# Patient Record
Sex: Female | Born: 1975 | Race: White | Hispanic: No | Smoking: Current every day smoker
Health system: Southern US, Community
[De-identification: ages and names within clinical notes are randomized; demographics above are authoritative.]

## PROBLEM LIST (undated history)

## (undated) ENCOUNTER — Emergency Department (HOSPITAL_COMMUNITY): Admission: EM | Payer: Medicaid Other | Source: Home / Self Care

## (undated) DIAGNOSIS — M1990 Unspecified osteoarthritis, unspecified site: Secondary | ICD-10-CM

## (undated) DIAGNOSIS — R51 Headache: Secondary | ICD-10-CM

## (undated) DIAGNOSIS — F3181 Bipolar II disorder: Secondary | ICD-10-CM

## (undated) DIAGNOSIS — R519 Headache, unspecified: Secondary | ICD-10-CM

## (undated) DIAGNOSIS — G5602 Carpal tunnel syndrome, left upper limb: Secondary | ICD-10-CM

## (undated) DIAGNOSIS — F419 Anxiety disorder, unspecified: Secondary | ICD-10-CM

## (undated) DIAGNOSIS — J449 Chronic obstructive pulmonary disease, unspecified: Secondary | ICD-10-CM

## (undated) DIAGNOSIS — F329 Major depressive disorder, single episode, unspecified: Secondary | ICD-10-CM

## (undated) DIAGNOSIS — F32A Depression, unspecified: Secondary | ICD-10-CM

## (undated) HISTORY — PX: WISDOM TOOTH EXTRACTION: SHX21

## (undated) HISTORY — PX: TOOTH EXTRACTION: SHX859

---

## 2000-09-15 ENCOUNTER — Emergency Department (HOSPITAL_COMMUNITY): Admission: EM | Admit: 2000-09-15 | Discharge: 2000-09-15 | Payer: Self-pay | Admitting: *Deleted

## 2000-09-15 ENCOUNTER — Encounter: Payer: Self-pay | Admitting: *Deleted

## 2001-07-23 ENCOUNTER — Emergency Department (HOSPITAL_COMMUNITY): Admission: EM | Admit: 2001-07-23 | Discharge: 2001-07-23 | Payer: Self-pay | Admitting: Internal Medicine

## 2002-05-23 ENCOUNTER — Emergency Department (HOSPITAL_COMMUNITY): Admission: EM | Admit: 2002-05-23 | Discharge: 2002-05-24 | Payer: Self-pay | Admitting: Emergency Medicine

## 2002-05-25 ENCOUNTER — Encounter: Payer: Self-pay | Admitting: Emergency Medicine

## 2002-05-25 ENCOUNTER — Emergency Department (HOSPITAL_COMMUNITY): Admission: EM | Admit: 2002-05-25 | Discharge: 2002-05-25 | Payer: Self-pay | Admitting: Emergency Medicine

## 2005-06-08 ENCOUNTER — Ambulatory Visit: Payer: Self-pay | Admitting: Family Medicine

## 2005-08-03 ENCOUNTER — Ambulatory Visit: Payer: Self-pay | Admitting: Family Medicine

## 2006-06-29 ENCOUNTER — Emergency Department (HOSPITAL_COMMUNITY): Admission: EM | Admit: 2006-06-29 | Discharge: 2006-06-30 | Payer: Self-pay | Admitting: Emergency Medicine

## 2008-03-08 ENCOUNTER — Encounter: Payer: Self-pay | Admitting: Family Medicine

## 2009-02-11 ENCOUNTER — Encounter: Payer: Self-pay | Admitting: Family Medicine

## 2009-12-16 ENCOUNTER — Ambulatory Visit (HOSPITAL_COMMUNITY): Admission: RE | Admit: 2009-12-16 | Discharge: 2009-12-16 | Payer: Self-pay | Admitting: Family Medicine

## 2009-12-16 IMAGING — CR DG SCAPULA*L*
3 series · 3 of 3 positions shown · non-contrast
Comparison: None.

CLINICAL DATA: Shoulder pain

LEFT SCAPULA - 2+ VIEWS

[view not recorded (1 of 3)]
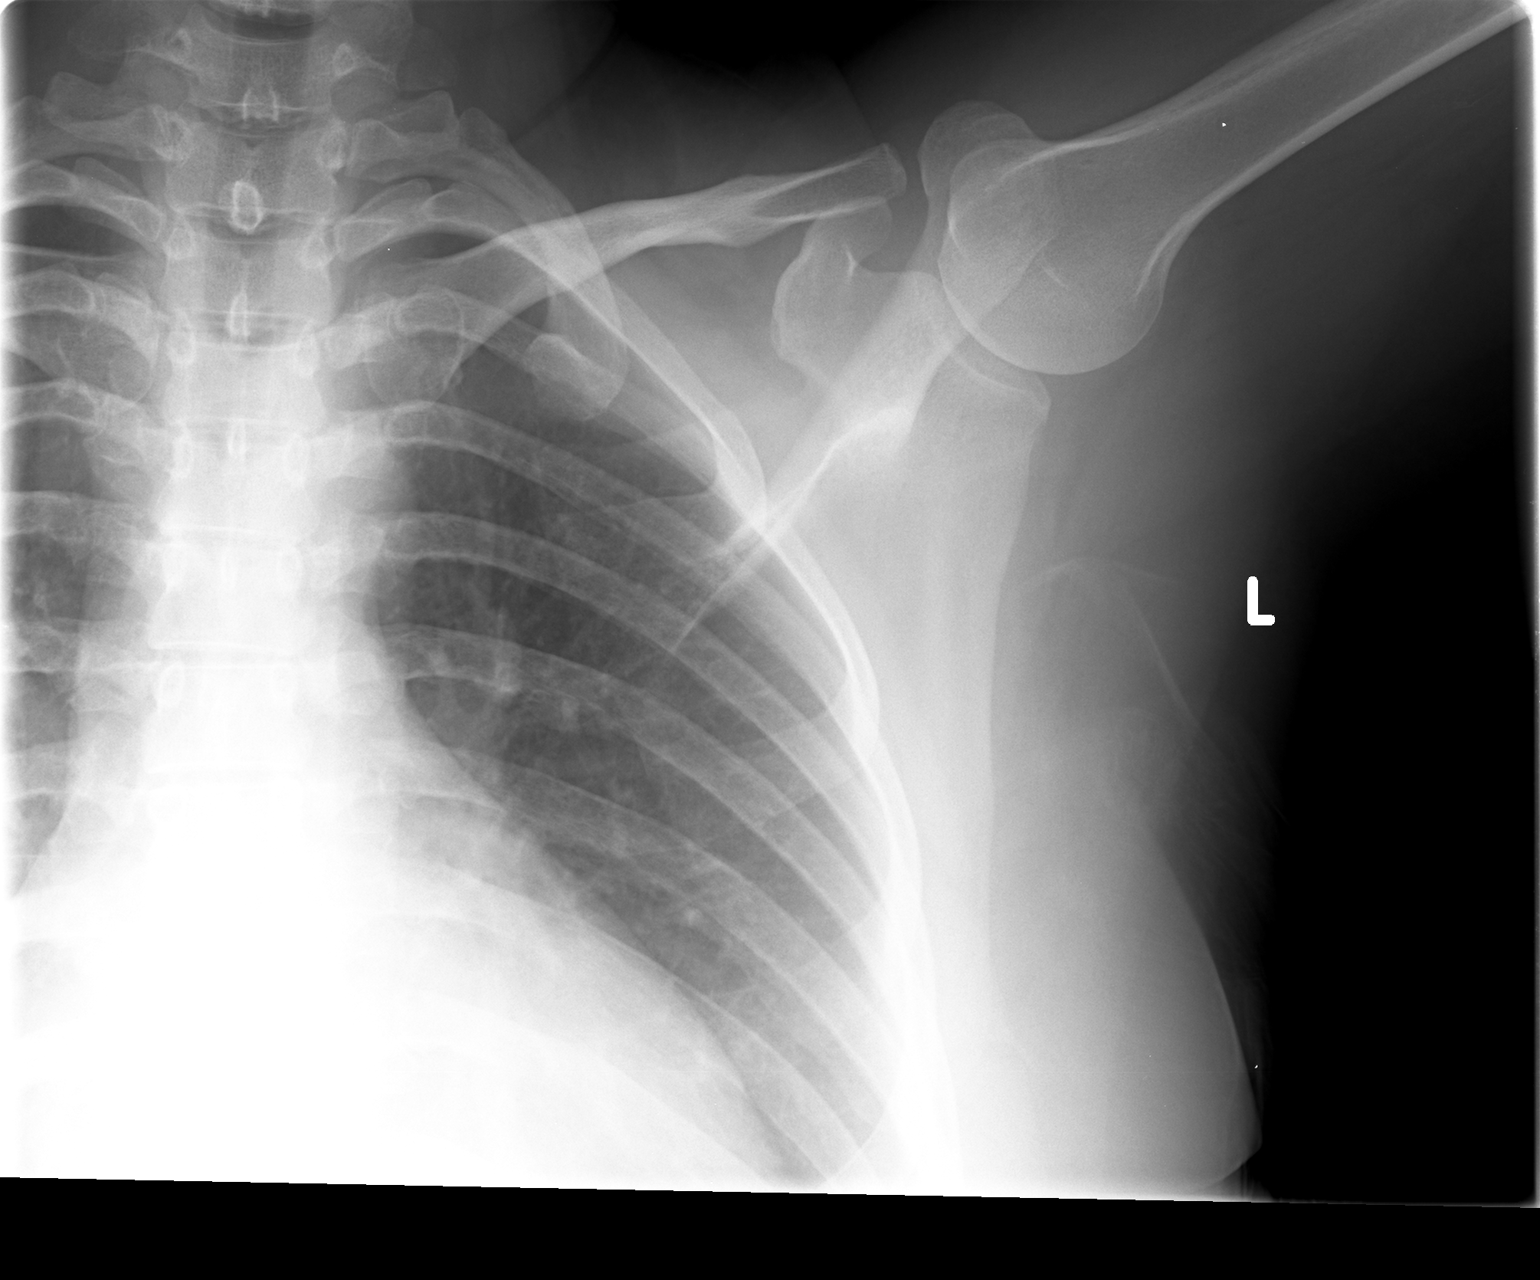

[view not recorded (2 of 3)]
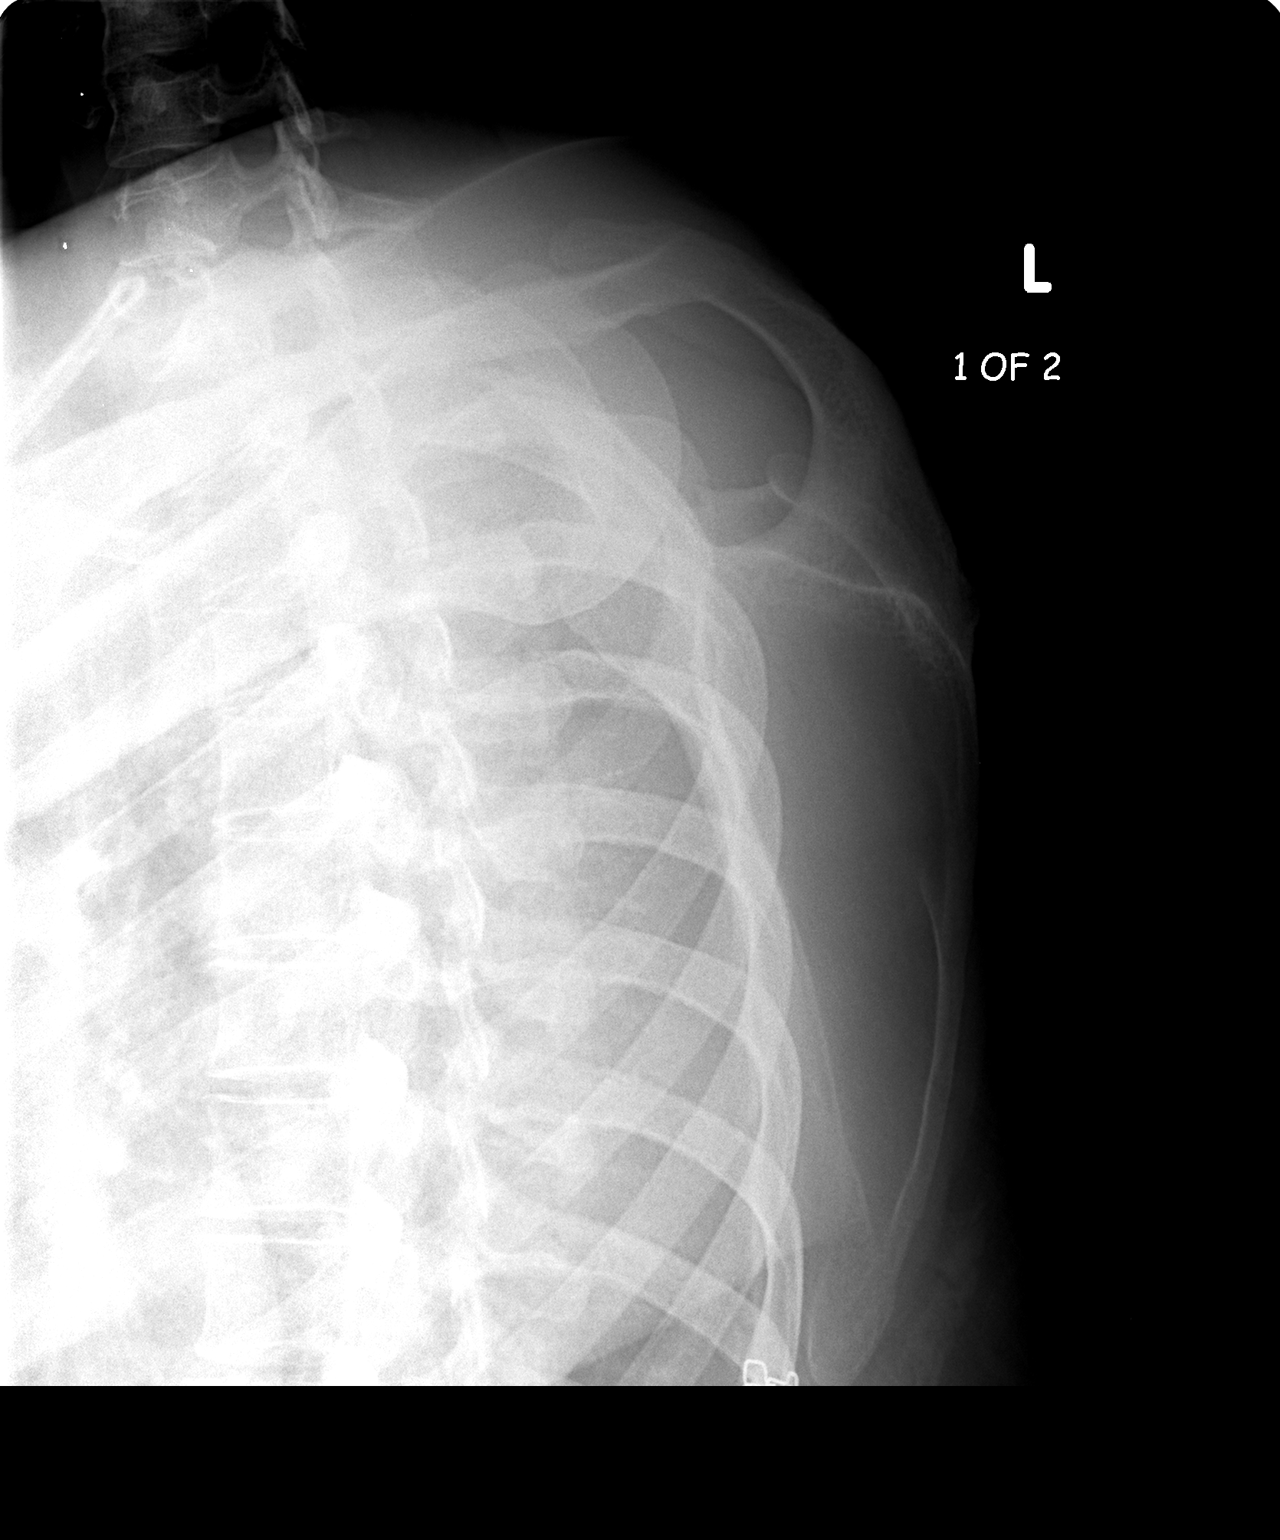

[view not recorded (3 of 3)]
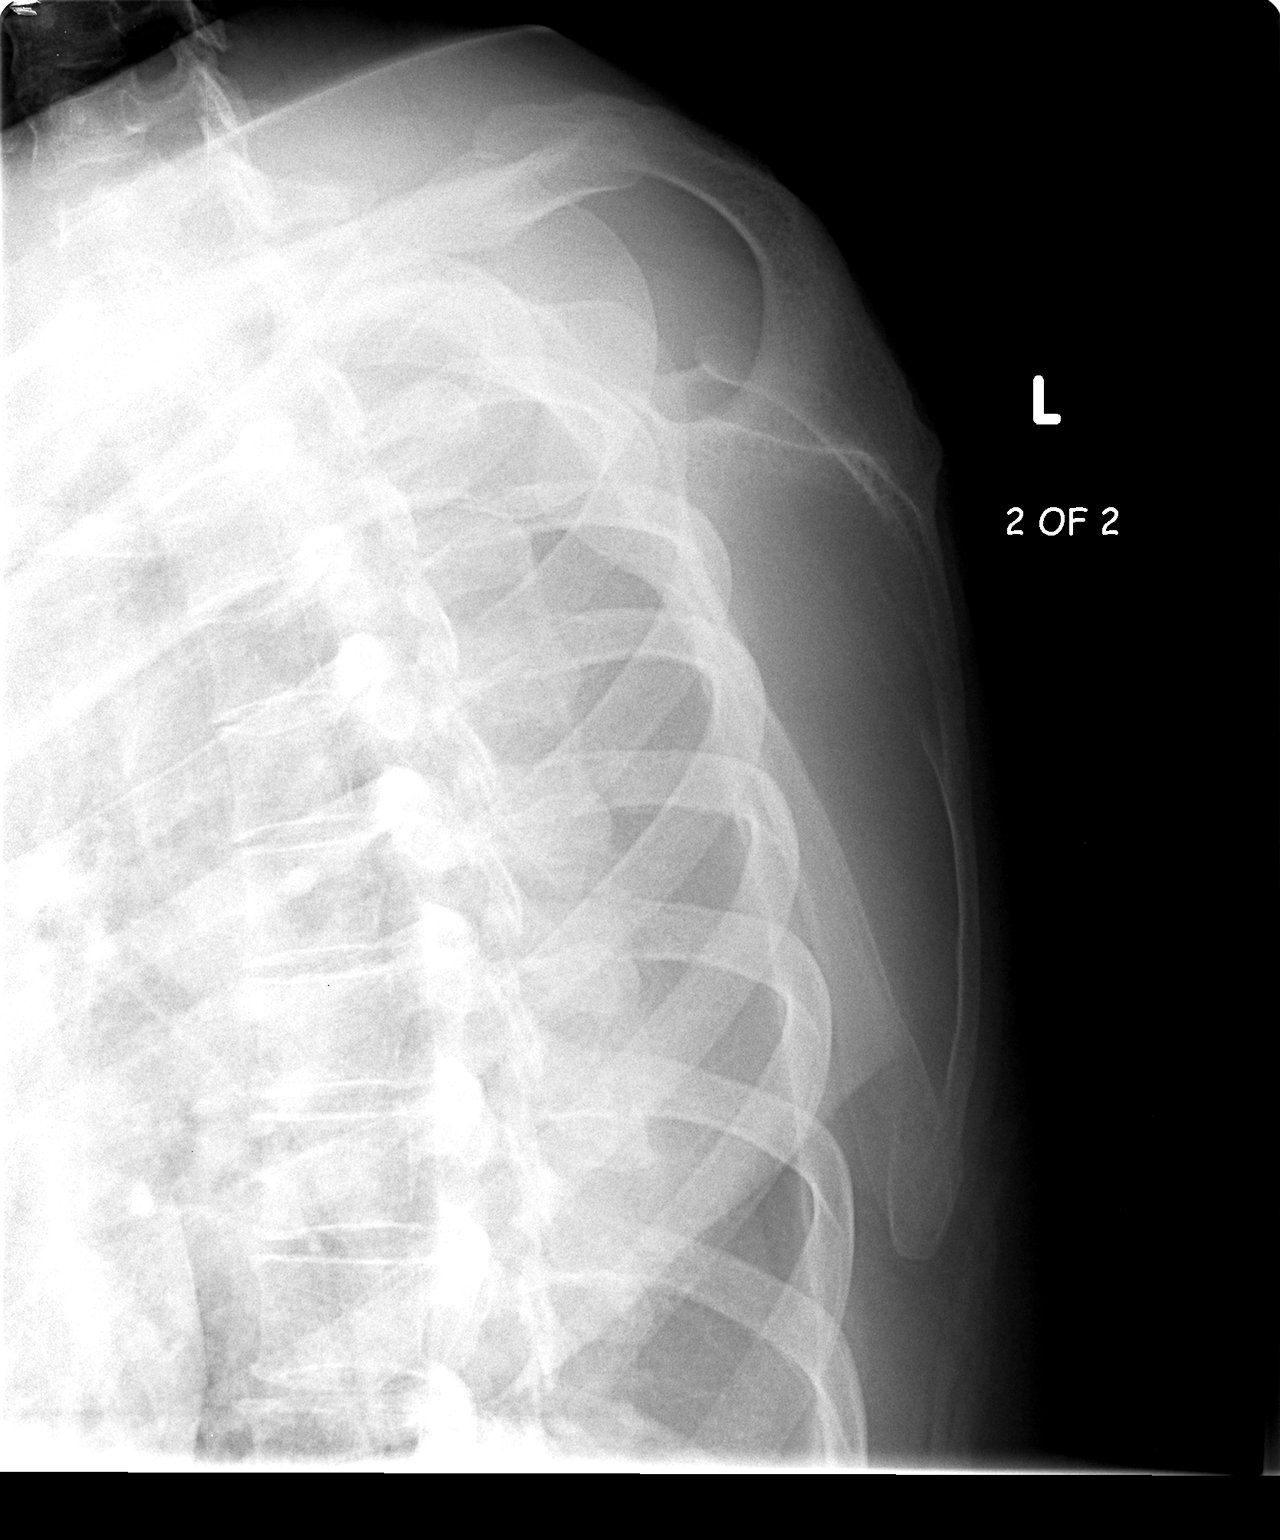

[3 of 3 positions shown; findings below may reference images not displayed]

FINDINGS: Left scapula appears intact.  Left AC joint and
glenohumeral joint aligned.  No fracture demonstrated.
IMPRESSION: No acute osseous finding.

## 2010-03-10 NOTE — Letter (Signed)
Summary: Historic Patient File  Historic Patient File   Imported By: Lind Guest 02/11/2009 08:10:22  _____________________________________________________________________  External Attachment:    Type:   Image     Comment:   External Document

## 2012-07-02 ENCOUNTER — Encounter (HOSPITAL_COMMUNITY): Payer: Self-pay | Admitting: Emergency Medicine

## 2012-07-02 ENCOUNTER — Emergency Department (HOSPITAL_COMMUNITY)
Admission: EM | Admit: 2012-07-02 | Discharge: 2012-07-03 | Disposition: A | Discharge status: Home / Self Care | Payer: No Typology Code available for payment source | Attending: Emergency Medicine | Admitting: Emergency Medicine

## 2012-07-02 DIAGNOSIS — IMO0002 Reserved for concepts with insufficient information to code with codable children: Secondary | ICD-10-CM | POA: Insufficient documentation

## 2012-07-02 DIAGNOSIS — S161XXA Strain of muscle, fascia and tendon at neck level, initial encounter: Secondary | ICD-10-CM

## 2012-07-02 DIAGNOSIS — S99919A Unspecified injury of unspecified ankle, initial encounter: Secondary | ICD-10-CM | POA: Insufficient documentation

## 2012-07-02 DIAGNOSIS — M25461 Effusion, right knee: Secondary | ICD-10-CM

## 2012-07-02 DIAGNOSIS — F17200 Nicotine dependence, unspecified, uncomplicated: Secondary | ICD-10-CM | POA: Insufficient documentation

## 2012-07-02 DIAGNOSIS — Y9389 Activity, other specified: Secondary | ICD-10-CM | POA: Insufficient documentation

## 2012-07-02 DIAGNOSIS — S8990XA Unspecified injury of unspecified lower leg, initial encounter: Secondary | ICD-10-CM | POA: Insufficient documentation

## 2012-07-02 DIAGNOSIS — Y92410 Unspecified street and highway as the place of occurrence of the external cause: Secondary | ICD-10-CM | POA: Insufficient documentation

## 2012-07-02 DIAGNOSIS — S139XXA Sprain of joints and ligaments of unspecified parts of neck, initial encounter: Secondary | ICD-10-CM | POA: Insufficient documentation
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
## 2012-07-03 ENCOUNTER — Emergency Department (HOSPITAL_COMMUNITY): Payer: No Typology Code available for payment source
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
## 2012-07-03 MED ORDER — IBUPROFEN 600 MG PO TABS: 600.0000 mg | ORAL_TABLET | Freq: Four times a day (QID) | ORAL | Status: DC | PRN

## 2012-07-03 MED ORDER — HYDROCODONE-ACETAMINOPHEN 5-325 MG PO TABS: 1.0000 | ORAL_TABLET | ORAL | Status: DC | PRN

## 2012-07-03 NOTE — ED Provider Notes (Signed)
Medical screening examination/treatment/procedure(s) were performed by non-physician practitioner and as supervising physician I was immediately available for consultation/collaboration.   Joya Gaskins, MD 07/03/12 [phone]
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
## 2012-07-11 ENCOUNTER — Encounter: Payer: Self-pay | Admitting: Orthopedic Surgery

## 2012-07-11 ENCOUNTER — Ambulatory Visit (INDEPENDENT_AMBULATORY_CARE_PROVIDER_SITE_OTHER): Payer: Medicaid Other | Admitting: Orthopedic Surgery

## 2012-07-11 VITALS — BP 110/70 | Ht 64.0 in | Wt 218.0 lb

## 2012-07-11 DIAGNOSIS — S8001XA Contusion of right knee, initial encounter: Secondary | ICD-10-CM

## 2012-07-11 DIAGNOSIS — M549 Dorsalgia, unspecified: Secondary | ICD-10-CM

## 2012-07-11 DIAGNOSIS — S8000XA Contusion of unspecified knee, initial encounter: Secondary | ICD-10-CM | POA: Insufficient documentation

## 2012-07-11 MED ORDER — HYDROCODONE-ACETAMINOPHEN 5-325 MG PO TABS: 1.0000 | ORAL_TABLET | ORAL | Status: DC | PRN

## 2012-07-11 MED ORDER — IBUPROFEN 800 MG PO TABS: 800.0000 mg | ORAL_TABLET | Freq: Three times a day (TID) | ORAL | Status: DC | PRN
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
## 2012-07-11 NOTE — Patient Instructions (Addendum)
Wear hinged knee brace  Try to bend the knee 25 times 3 x a day   Take medications as ordered pick up from pharmacy and get paper script filled

## 2012-07-16 ENCOUNTER — Emergency Department (HOSPITAL_COMMUNITY)
Admission: EM | Admit: 2012-07-16 | Discharge: 2012-07-16 | Disposition: A | Discharge status: Home / Self Care | Payer: No Typology Code available for payment source | Attending: Emergency Medicine | Admitting: Emergency Medicine

## 2012-07-16 ENCOUNTER — Encounter (HOSPITAL_COMMUNITY): Payer: Self-pay | Admitting: *Deleted

## 2012-07-16 DIAGNOSIS — E669 Obesity, unspecified: Secondary | ICD-10-CM | POA: Insufficient documentation

## 2012-07-16 DIAGNOSIS — Z87828 Personal history of other (healed) physical injury and trauma: Secondary | ICD-10-CM | POA: Insufficient documentation

## 2012-07-16 DIAGNOSIS — R5383 Other fatigue: Secondary | ICD-10-CM | POA: Insufficient documentation

## 2012-07-16 DIAGNOSIS — IMO0002 Reserved for concepts with insufficient information to code with codable children: Secondary | ICD-10-CM | POA: Insufficient documentation

## 2012-07-16 DIAGNOSIS — R209 Unspecified disturbances of skin sensation: Secondary | ICD-10-CM | POA: Insufficient documentation

## 2012-07-16 DIAGNOSIS — M542 Cervicalgia: Secondary | ICD-10-CM | POA: Insufficient documentation

## 2012-07-16 DIAGNOSIS — M549 Dorsalgia, unspecified: Secondary | ICD-10-CM | POA: Insufficient documentation

## 2012-07-16 DIAGNOSIS — F17200 Nicotine dependence, unspecified, uncomplicated: Secondary | ICD-10-CM | POA: Insufficient documentation

## 2012-07-16 DIAGNOSIS — R5381 Other malaise: Secondary | ICD-10-CM | POA: Insufficient documentation
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
## 2012-07-16 NOTE — ED Notes (Signed)
Left after talking with PA, DID NOT WAIT FOR DISCHARGE INSTRUCTIONS

## 2012-07-16 NOTE — ED Notes (Signed)
When I asked pt is she had any medication for pain at home, pt states that she did not have any pain medication at home, that she had only received one prescription while she was here and that was for 15 tablets, I verified pt's medications with pt, reviewed Dr. Mort Sawyers note that stated that on 07/11/2012 pt was given vicodin 42 tablets with 3 refills, when asked about this prescription pt states that she had forgotten about it and that she had received a text from the pharmacy this am stating that it was ready.
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
## 2012-07-20 NOTE — ED Provider Notes (Signed)
Medical screening examination/treatment/procedure(s) were performed by non-physician practitioner and as supervising physician I was immediately available for consultation/collaboration.   Shelda Jakes, MD 07/20/12 [phone]

## 2012-08-08 ENCOUNTER — Telehealth: Payer: Self-pay | Admitting: Orthopedic Surgery

## 2012-08-08 ENCOUNTER — Ambulatory Visit (INDEPENDENT_AMBULATORY_CARE_PROVIDER_SITE_OTHER): Payer: Medicaid Other | Admitting: Orthopedic Surgery

## 2012-08-08 ENCOUNTER — Encounter: Payer: Self-pay | Admitting: Orthopedic Surgery

## 2012-08-08 VITALS — BP 130/82 | Ht 64.0 in | Wt 218.0 lb

## 2012-08-08 DIAGNOSIS — IMO0002 Reserved for concepts with insufficient information to code with codable children: Secondary | ICD-10-CM

## 2012-08-08 DIAGNOSIS — M792 Neuralgia and neuritis, unspecified: Secondary | ICD-10-CM | POA: Insufficient documentation
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
## 2012-08-08 NOTE — Patient Instructions (Addendum)
MRI CERVICAL SPINE   You have received a steroid shot. 15% of patients experience increased pain at the injection site with in the next 24 hours. This is best treated with ice and tylenol extra strength 2 tabs every 8 hours. If you are still having pain please call the office.

## 2012-08-14 ENCOUNTER — Telehealth: Payer: Self-pay | Admitting: Orthopedic Surgery

## 2012-08-14 NOTE — Telephone Encounter (Signed)
Patient called, states was advised by our office to call when she was out of medication and needed refill for: Hydrocodone-Norco/Vicodine 5-325mg .  Her ph# is [phone].

## 2012-08-14 NOTE — Telephone Encounter (Signed)
Called patient and ask her to have pharmacy fax refill request.

## 2012-08-22 ENCOUNTER — Ambulatory Visit: Payer: Medicaid Other | Admitting: Orthopedic Surgery

## 2012-10-19 ENCOUNTER — Encounter: Payer: Self-pay | Admitting: Orthopedic Surgery

## 2012-10-19 NOTE — Telephone Encounter (Signed)
10/19/12 Patient stopped in to office, mainly for copy of work note from previous visit (07/11/12) - done as per request. She mentioned, in regard to MRI which is on hold, due to insurance and new primary care physician, that her new primary care doctor states she needs to first try using exercise ball for her back.  No authorization for MRI; hold.

## 2013-06-04 ENCOUNTER — Encounter (HOSPITAL_COMMUNITY): Payer: Self-pay | Admitting: Emergency Medicine

## 2013-06-04 ENCOUNTER — Emergency Department (HOSPITAL_COMMUNITY): Payer: No Typology Code available for payment source

## 2013-06-04 ENCOUNTER — Emergency Department (HOSPITAL_COMMUNITY)
Admission: EM | Admit: 2013-06-04 | Discharge: 2013-06-04 | Disposition: A | Discharge status: Home / Self Care | Payer: No Typology Code available for payment source | Attending: Emergency Medicine | Admitting: Emergency Medicine

## 2013-06-04 DIAGNOSIS — Y9389 Activity, other specified: Secondary | ICD-10-CM | POA: Insufficient documentation

## 2013-06-04 DIAGNOSIS — Y92410 Unspecified street and highway as the place of occurrence of the external cause: Secondary | ICD-10-CM | POA: Insufficient documentation

## 2013-06-04 DIAGNOSIS — F411 Generalized anxiety disorder: Secondary | ICD-10-CM | POA: Insufficient documentation

## 2013-06-04 DIAGNOSIS — Z3202 Encounter for pregnancy test, result negative: Secondary | ICD-10-CM | POA: Insufficient documentation

## 2013-06-04 DIAGNOSIS — F329 Major depressive disorder, single episode, unspecified: Secondary | ICD-10-CM | POA: Insufficient documentation

## 2013-06-04 DIAGNOSIS — Z79899 Other long term (current) drug therapy: Secondary | ICD-10-CM | POA: Insufficient documentation

## 2013-06-04 DIAGNOSIS — S335XXA Sprain of ligaments of lumbar spine, initial encounter: Secondary | ICD-10-CM | POA: Insufficient documentation

## 2013-06-04 DIAGNOSIS — S139XXA Sprain of joints and ligaments of unspecified parts of neck, initial encounter: Secondary | ICD-10-CM | POA: Insufficient documentation

## 2013-06-04 DIAGNOSIS — S161XXA Strain of muscle, fascia and tendon at neck level, initial encounter: Secondary | ICD-10-CM

## 2013-06-04 DIAGNOSIS — F17200 Nicotine dependence, unspecified, uncomplicated: Secondary | ICD-10-CM | POA: Insufficient documentation

## 2013-06-04 DIAGNOSIS — S39012A Strain of muscle, fascia and tendon of lower back, initial encounter: Secondary | ICD-10-CM

## 2013-06-04 DIAGNOSIS — F3289 Other specified depressive episodes: Secondary | ICD-10-CM | POA: Insufficient documentation

## 2013-06-04 HISTORY — DX: Major depressive disorder, single episode, unspecified: F32.9

## 2013-06-04 HISTORY — DX: Depression, unspecified: F32.A

## 2013-06-04 HISTORY — DX: Anxiety disorder, unspecified: F41.9

## 2013-06-04 LAB — POC URINE PREG, ED: Preg Test, Ur: NEGATIVE
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
## 2013-06-04 MED ORDER — ACETAMINOPHEN 325 MG PO TABS
650.0000 mg | ORAL_TABLET | Freq: Once | ORAL | Status: AC
  Administered 2013-06-04: 650 mg via ORAL
  Filled 2013-06-04: qty 2

## 2013-06-04 MED ORDER — CYCLOBENZAPRINE HCL 5 MG PO TABS: 5.0000 mg | ORAL_TABLET | Freq: Three times a day (TID) | ORAL | Status: DC | PRN

## 2013-06-04 MED ORDER — IBUPROFEN 600 MG PO TABS: 600.0000 mg | ORAL_TABLET | Freq: Four times a day (QID) | ORAL | Status: DC | PRN

## 2013-06-04 NOTE — ED Notes (Signed)
Walked to BR, refused to use bedpan again
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
## 2013-06-04 NOTE — ED Provider Notes (Signed)
Medical screening examination/treatment/procedure(s) were performed by non-physician practitioner and as supervising physician I was immediately available for consultation/collaboration.   EKG Interpretation None        Kyara Boxer L Perrie Ragin, MD 06/04/13 2258 
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
## 2014-05-24 ENCOUNTER — Emergency Department (HOSPITAL_COMMUNITY): Payer: Medicaid Other

## 2014-05-24 ENCOUNTER — Emergency Department (HOSPITAL_COMMUNITY)
Admission: EM | Admit: 2014-05-24 | Discharge: 2014-05-24 | Disposition: A | Discharge status: Home / Self Care | Payer: Medicaid Other | Attending: Emergency Medicine | Admitting: Emergency Medicine

## 2014-05-24 ENCOUNTER — Encounter (HOSPITAL_COMMUNITY): Payer: Self-pay | Admitting: *Deleted

## 2014-05-24 DIAGNOSIS — Y998 Other external cause status: Secondary | ICD-10-CM | POA: Diagnosis not present

## 2014-05-24 DIAGNOSIS — Y9389 Activity, other specified: Secondary | ICD-10-CM | POA: Diagnosis not present

## 2014-05-24 DIAGNOSIS — S299XXA Unspecified injury of thorax, initial encounter: Secondary | ICD-10-CM | POA: Diagnosis not present

## 2014-05-24 DIAGNOSIS — R52 Pain, unspecified: Secondary | ICD-10-CM | POA: Diagnosis present

## 2014-05-24 DIAGNOSIS — Z79899 Other long term (current) drug therapy: Secondary | ICD-10-CM | POA: Insufficient documentation

## 2014-05-24 DIAGNOSIS — S3991XA Unspecified injury of abdomen, initial encounter: Secondary | ICD-10-CM | POA: Diagnosis not present

## 2014-05-24 DIAGNOSIS — Y92410 Unspecified street and highway as the place of occurrence of the external cause: Secondary | ICD-10-CM | POA: Insufficient documentation

## 2014-05-24 DIAGNOSIS — S161XXA Strain of muscle, fascia and tendon at neck level, initial encounter: Secondary | ICD-10-CM | POA: Diagnosis not present

## 2014-05-24 DIAGNOSIS — F419 Anxiety disorder, unspecified: Secondary | ICD-10-CM | POA: Diagnosis not present

## 2014-05-24 DIAGNOSIS — F329 Major depressive disorder, single episode, unspecified: Secondary | ICD-10-CM | POA: Diagnosis not present

## 2014-05-24 DIAGNOSIS — Z720 Tobacco use: Secondary | ICD-10-CM | POA: Insufficient documentation
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
## 2014-05-24 MED ORDER — CYCLOBENZAPRINE HCL 10 MG PO TABS: 10.0000 mg | ORAL_TABLET | Freq: Three times a day (TID) | ORAL | Status: DC

## 2014-05-24 MED ORDER — HYDROCODONE-ACETAMINOPHEN 5-325 MG PO TABS: 1.0000 | ORAL_TABLET | Freq: Four times a day (QID) | ORAL | Status: DC | PRN
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
## 2014-05-24 NOTE — Discharge Instructions (Signed)
Follow up with your md next week if needed °
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
## 2015-02-17 ENCOUNTER — Emergency Department (HOSPITAL_COMMUNITY)
Admission: EM | Admit: 2015-02-17 | Discharge: 2015-02-17 | Disposition: A | Discharge status: Home / Self Care | Payer: Medicaid Other
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
## 2015-03-02 ENCOUNTER — Emergency Department (HOSPITAL_COMMUNITY)
Admission: EM | Admit: 2015-03-02 | Discharge: 2015-03-02 | Disposition: A | Discharge status: Home / Self Care | Payer: Medicaid Other | Attending: Emergency Medicine | Admitting: Emergency Medicine

## 2015-03-02 ENCOUNTER — Emergency Department (HOSPITAL_COMMUNITY): Payer: Medicaid Other

## 2015-03-02 ENCOUNTER — Encounter (HOSPITAL_COMMUNITY): Payer: Self-pay | Admitting: Emergency Medicine

## 2015-03-02 DIAGNOSIS — Y9389 Activity, other specified: Secondary | ICD-10-CM | POA: Diagnosis not present

## 2015-03-02 DIAGNOSIS — W228XXA Striking against or struck by other objects, initial encounter: Secondary | ICD-10-CM | POA: Diagnosis not present

## 2015-03-02 DIAGNOSIS — Z79899 Other long term (current) drug therapy: Secondary | ICD-10-CM | POA: Diagnosis not present

## 2015-03-02 DIAGNOSIS — Y998 Other external cause status: Secondary | ICD-10-CM | POA: Diagnosis not present

## 2015-03-02 DIAGNOSIS — S62626A Displaced fracture of medial phalanx of right little finger, initial encounter for closed fracture: Secondary | ICD-10-CM | POA: Insufficient documentation

## 2015-03-02 DIAGNOSIS — F17210 Nicotine dependence, cigarettes, uncomplicated: Secondary | ICD-10-CM | POA: Diagnosis not present

## 2015-03-02 DIAGNOSIS — Y9289 Other specified places as the place of occurrence of the external cause: Secondary | ICD-10-CM | POA: Insufficient documentation

## 2015-03-02 DIAGNOSIS — F419 Anxiety disorder, unspecified: Secondary | ICD-10-CM | POA: Insufficient documentation

## 2015-03-02 DIAGNOSIS — F329 Major depressive disorder, single episode, unspecified: Secondary | ICD-10-CM | POA: Diagnosis not present

## 2015-03-02 DIAGNOSIS — S6991XA Unspecified injury of right wrist, hand and finger(s), initial encounter: Secondary | ICD-10-CM | POA: Diagnosis present

## 2015-03-02 DIAGNOSIS — S62609A Fracture of unspecified phalanx of unspecified finger, initial encounter for closed fracture: Secondary | ICD-10-CM
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
## 2015-03-02 MED ORDER — OXYCODONE-ACETAMINOPHEN 5-325 MG PO TABS
1.0000 | ORAL_TABLET | Freq: Once | ORAL | Status: AC
  Administered 2015-03-02: 1 via ORAL
  Filled 2015-03-02: qty 1

## 2015-03-02 MED ORDER — OXYCODONE-ACETAMINOPHEN 5-325 MG PO TABS: 1.0000 | ORAL_TABLET | ORAL | Status: DC | PRN

## 2015-03-02 NOTE — Discharge Instructions (Signed)
Finger Fracture  Fractures of fingers are breaks in the bones of the fingers. There are many types of fractures. There are different ways of treating these fractures. Your health care provider will discuss the best way to treat your fracture.  CAUSES  Traumatic injury is the main cause of broken fingers. These include:  · Injuries while playing sports.  · Workplace injuries.  · Falls.  RISK FACTORS  Activities that can increase your risk of finger fractures include:  · Sports.  · Workplace activities that involve machinery.  · A condition called osteoporosis, which can make your bones less dense and cause them to fracture more easily.  SIGNS AND SYMPTOMS  The main symptoms of a broken finger are pain and swelling within 15 minutes after the injury. Other symptoms include:  · Bruising of your finger.  · Stiffness of your finger.  · Numbness of your finger.  · Exposed bones (compound fracture) if the fracture is severe.  DIAGNOSIS   The best way to diagnose a broken bone is with X-ray imaging. Additionally, your health care provider will use this X-ray image to evaluate the position of the broken finger bones.   TREATMENT   Finger fractures can be treated with:   · Nonreduction--This means the bones are in place. The finger is splinted without changing the positions of the bone pieces. The splint is usually left on for about a week to 10 days. This will depend on your fracture and what your health care provider thinks.  · Closed reduction--The bones are put back into position without using surgery. The finger is then splinted.  · Open reduction and internal fixation--The fracture site is opened. Then the bone pieces are fixed into place with pins or some type of hardware. This is seldom required. It depends on the severity of the fracture.  HOME CARE INSTRUCTIONS   · Follow your health care provider's instructions regarding activities, exercises, and physical therapy.  · Only take over-the-counter or prescription  medicines for pain, discomfort, or fever as directed by your health care provider.  SEEK MEDICAL CARE IF:  You have pain or swelling that limits the motion or use of your fingers.  SEEK IMMEDIATE MEDICAL CARE IF:   Your finger becomes numb.  MAKE SURE YOU:   · Understand these instructions.  · Will watch your condition.  · Will get help right away if you are not doing well or get worse.     This information is not intended to replace advice given to you by your health care provider. Make sure you discuss any questions you have with your health care provider.     Document Released: 05/09/2000 Document Revised: 11/15/2012 Document Reviewed: 09/06/2012  Elsevier Interactive Patient Education ©2016 [employer]
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
## 2015-03-04 ENCOUNTER — Telehealth: Payer: Self-pay | Admitting: *Deleted

## 2015-03-04 NOTE — Telephone Encounter (Signed)
Patient called to schedule a appointment from a ER visit she has 03/02/15, patient has a fx finger per xray 03/02/15. Patient states she has medicaid and I made her aware that we will need a referral from her primary care physician  before we can schedule her to be seen by Dr. Romeo Apple. Patient states she will call Dr. Sudie Bailey. I made patient aware once we receive the referral we can then schedule her to be seen.

## 2015-03-10 NOTE — Telephone Encounter (Signed)
Received referral from primary care, Dr Sudie Bailey; appointment is scheduled for first available after move into our new location; patient aware of appointment.

## 2015-03-12 ENCOUNTER — Telehealth: Payer: Self-pay

## 2015-03-12 NOTE — Telephone Encounter (Signed)
Patient called to discuss xray results.  Please advise.

## 2015-03-12 NOTE — Telephone Encounter (Signed)
03/11/15 patient has been scheduled accordingly, per Dr Mort Sawyers review, and is aware of appointment.

## 2015-03-12 NOTE — Telephone Encounter (Signed)
Please advise that results will be reviewed at time of office visit, thanks

## 2015-03-12 NOTE — Telephone Encounter (Signed)
Called back to patient; see previous phone note in which Dr Romeo Apple reviewed and approved appointment date of (next clinic day) Friday 03/14/15, for which I had already scheduled - left message for patient to call back to confirm.

## 2015-03-14 ENCOUNTER — Encounter: Payer: Self-pay | Admitting: Orthopedic Surgery

## 2015-03-14 ENCOUNTER — Ambulatory Visit (INDEPENDENT_AMBULATORY_CARE_PROVIDER_SITE_OTHER): Payer: Medicaid Other

## 2015-03-14 ENCOUNTER — Ambulatory Visit (INDEPENDENT_AMBULATORY_CARE_PROVIDER_SITE_OTHER): Payer: Medicaid Other | Admitting: Orthopedic Surgery

## 2015-03-14 VITALS — BP 137/90 | HR 78 | Resp 16 | Ht 64.0 in | Wt 167.0 lb

## 2015-03-14 DIAGNOSIS — S62629A Displaced fracture of medial phalanx of unspecified finger, initial encounter for closed fracture: Secondary | ICD-10-CM | POA: Diagnosis not present

## 2015-03-14 DIAGNOSIS — S6991XA Unspecified injury of right wrist, hand and finger(s), initial encounter: Secondary | ICD-10-CM

## 2015-03-14 NOTE — Progress Notes (Signed)
Patient ID: Ryonna Cimini, female   DOB: 1975/05/19, 40 y.o.   MRN: [id number]  Chief Complaint  Patient presents with  . Hand Pain    Rt 5th digit middle phalynx fx. My finger still hurts and is red and purple and bruised into my hand.    HPI Ilham Roughton is a 39 y.o. female.  Presents with a 12 day old fracture right small finger injured at home cleaning up  Pain mild right small finger Swelling Slight aching Constant  Review of Systems Review of Systems  Denies numbness or tingling under neuro Swelling under musculoskeletal Vascular normal temperature of the digit  Past Medical History  Diagnosis Date  . Anxiety   . Depression     No history of surgery    Social History Social History  Substance Use Topics  . Smoking status: Current Every Day Smoker  . Smokeless tobacco: None  . Alcohol Use: No    Allergies  Allergen Reactions  . Bee Venom Swelling  . Ragwitek [Short Ragweed Pollen Ext]     Current Outpatient Prescriptions  Medication Sig Dispense Refill  . citalopram (CELEXA) 20 MG tablet Take 20 mg by mouth daily.    . clonazePAM (KLONOPIN) 1 MG tablet Take 1 mg by mouth at bedtime.    Marland Kitchen oxyCODONE-acetaminophen (PERCOCET/ROXICET) 5-325 MG tablet Take 1 tablet by mouth every 4 (four) hours as needed for severe pain. (Patient not taking: Reported on 03/14/2015) 15 tablet 0   No current facility-administered medications for this visit.       Physical Exam Physical Exam Blood pressure 137/90, pulse 78, resp. rate 16, height  (1.626 m), weight 167 lb (75.751 kg), last menstrual period 02/25/2015. Appearance, there are no abnormalities in terms of appearance the patient was well-developed and well-nourished. The grooming and hygiene were normal.  Mental status orientation, there was normal alertness and orientation Mood pleasant Ambulatory status normal with no assistive devices  Examination of the right small finger versus left small finger shows  normal alignment Inspection tenderness over the right small finger at the fracture site middle phalanx Range of motion 10 of flexion at the IP and DIP joint Tests for stability were normal varus valgus Motor strength  muscle tone normal flexor tendon strength normal Skin warm dry and intact without laceration or ulceration or erythema Neurologic examination normal sensation Vascular examination normal pulses with warm extremity and normal capillary refill    Data Reviewed Imaging hospital film reviewed initial film nondisplaced middle phalanx fracture  Today's x-ray ordered and independently reviewed by me shows nondisplaced fracture  Assessment  Nondisplaced middle phalanx fracture   Plan  Buddy tape active range of motion x-ray in 4 weeks

## 2015-03-14 NOTE — Patient Instructions (Addendum)
CONTINUE TAPING FINGERS

## 2015-03-17 NOTE — Telephone Encounter (Signed)
Appointment completed as scheduled 03/14/15.

## 2015-04-14 ENCOUNTER — Ambulatory Visit: Payer: Medicaid Other | Admitting: Orthopedic Surgery

## 2015-04-14 ENCOUNTER — Ambulatory Visit: Payer: Self-pay | Admitting: Gynecology

## 2015-04-21 ENCOUNTER — Ambulatory Visit (INDEPENDENT_AMBULATORY_CARE_PROVIDER_SITE_OTHER): Payer: Medicaid Other

## 2015-04-21 ENCOUNTER — Ambulatory Visit (INDEPENDENT_AMBULATORY_CARE_PROVIDER_SITE_OTHER): Payer: Self-pay | Admitting: Orthopedic Surgery

## 2015-04-21 VITALS — BP 134/93 | Ht 64.0 in | Wt 167.0 lb

## 2015-04-21 DIAGNOSIS — M20011 Mallet finger of right finger(s): Secondary | ICD-10-CM

## 2015-04-21 DIAGNOSIS — S62609A Fracture of unspecified phalanx of unspecified finger, initial encounter for closed fracture: Secondary | ICD-10-CM

## 2015-04-21 NOTE — Progress Notes (Signed)
Chief Complaint  Patient presents with  . Follow-up    FOLLOW UP + XRAY RIGHT SMALL FINGER FX, DOI 03/02/15    X-ray of the finger shows that the fracture is healed minimal if any angulation however she has a extensor lag consistent with a mallet finger and we will send her for extension splinting for 6 weeks follow-up 6 weeks recheck

## 2015-04-21 NOTE — Patient Instructions (Addendum)
CALL [facility] TO SCHEDULE VISIT FOR SPLINT FITTING- [phone]

## 2015-04-22 ENCOUNTER — Ambulatory Visit: Payer: Medicaid Other | Admitting: Orthopedic Surgery

## 2015-05-30 ENCOUNTER — Ambulatory Visit (HOSPITAL_COMMUNITY): Payer: Medicaid Other | Attending: Orthopedic Surgery

## 2015-06-02 ENCOUNTER — Ambulatory Visit: Payer: Medicaid Other | Admitting: Orthopedic Surgery

## 2015-06-02 ENCOUNTER — Telehealth (HOSPITAL_COMMUNITY): Payer: Self-pay

## 2015-06-02 NOTE — Telephone Encounter (Signed)
06/02/15 called to reschedule from the no show om 4/21.  She said that she started a new job and once she knew her hours she would call us back to reschedule.

## 2015-10-10 ENCOUNTER — Emergency Department (HOSPITAL_COMMUNITY): Payer: Medicaid Other

## 2015-10-10 ENCOUNTER — Emergency Department (HOSPITAL_COMMUNITY)
Admission: EM | Admit: 2015-10-10 | Discharge: 2015-10-10 | Disposition: A | Discharge status: Home / Self Care | Payer: Medicaid Other | Attending: Emergency Medicine | Admitting: Emergency Medicine

## 2015-10-10 ENCOUNTER — Encounter (HOSPITAL_COMMUNITY): Payer: Self-pay | Admitting: *Deleted

## 2015-10-10 DIAGNOSIS — F17200 Nicotine dependence, unspecified, uncomplicated: Secondary | ICD-10-CM | POA: Diagnosis not present

## 2015-10-10 DIAGNOSIS — M79642 Pain in left hand: Secondary | ICD-10-CM | POA: Diagnosis not present

## 2015-10-10 DIAGNOSIS — M79641 Pain in right hand: Secondary | ICD-10-CM | POA: Insufficient documentation

## 2015-10-10 DIAGNOSIS — Z79899 Other long term (current) drug therapy: Secondary | ICD-10-CM | POA: Diagnosis not present

## 2015-10-10 DIAGNOSIS — R200 Anesthesia of skin: Secondary | ICD-10-CM

## 2015-10-10 DIAGNOSIS — R51 Headache: Secondary | ICD-10-CM | POA: Diagnosis not present

## 2015-10-10 LAB — CBC WITH DIFFERENTIAL/PLATELET
BASOS ABS: 0 10*3/uL (ref 0.0–0.1)
Basophils Relative: 0 %
EOS PCT: 1 %
Eosinophils Absolute: 0.1 10*3/uL (ref 0.0–0.7)
HCT: 37.3 % (ref 36.0–46.0)
Hemoglobin: 12 g/dL (ref 12.0–15.0)
LYMPHS PCT: 25 %
Lymphs Abs: 2.2 10*3/uL (ref 0.7–4.0)
MCH: 31.4 pg (ref 26.0–34.0)
MCHC: 32.2 g/dL (ref 30.0–36.0)
MCV: 97.6 fL (ref 78.0–100.0)
MONO ABS: 0.7 10*3/uL (ref 0.1–1.0)
MONOS PCT: 8 %
Neutro Abs: 5.6 10*3/uL (ref 1.7–7.7)
Neutrophils Relative %: 66 %
PLATELETS: 188 10*3/uL (ref 150–400)
RBC: 3.82 MIL/uL — ABNORMAL LOW (ref 3.87–5.11)
RDW: 14.4 % (ref 11.5–15.5)
WBC: 8.6 10*3/uL (ref 4.0–10.5)

## 2015-10-10 LAB — BASIC METABOLIC PANEL
Anion gap: 7 (ref 5–15)
BUN: 9 mg/dL (ref 6–20)
CO2: 28 mmol/L (ref 22–32)
Calcium: 8.4 mg/dL — ABNORMAL LOW (ref 8.9–10.3)
Chloride: 102 mmol/L (ref 101–111)
Creatinine, Ser: 0.79 mg/dL (ref 0.44–1.00)
GFR calc Af Amer: >60 mL/min (ref >60)
GLUCOSE: 105 mg/dL — AB (ref 65–99)
POTASSIUM: 4 mmol/L (ref 3.5–5.1)
Sodium: 137 mmol/L (ref 135–145)

## 2015-10-10 IMAGING — DX DG HAND COMPLETE 3+V*R*
3 series · 3 of 3 positions shown · non-contrast
Comparison: Plain films right little finger [DATE].

CLINICAL DATA: Burning and numbness in both hands, chronic. History
of right little finger fracture in [REDACTED] of this year.

EXAM:
RIGHT HAND - COMPLETE 3+ VIEW

[hand pa]
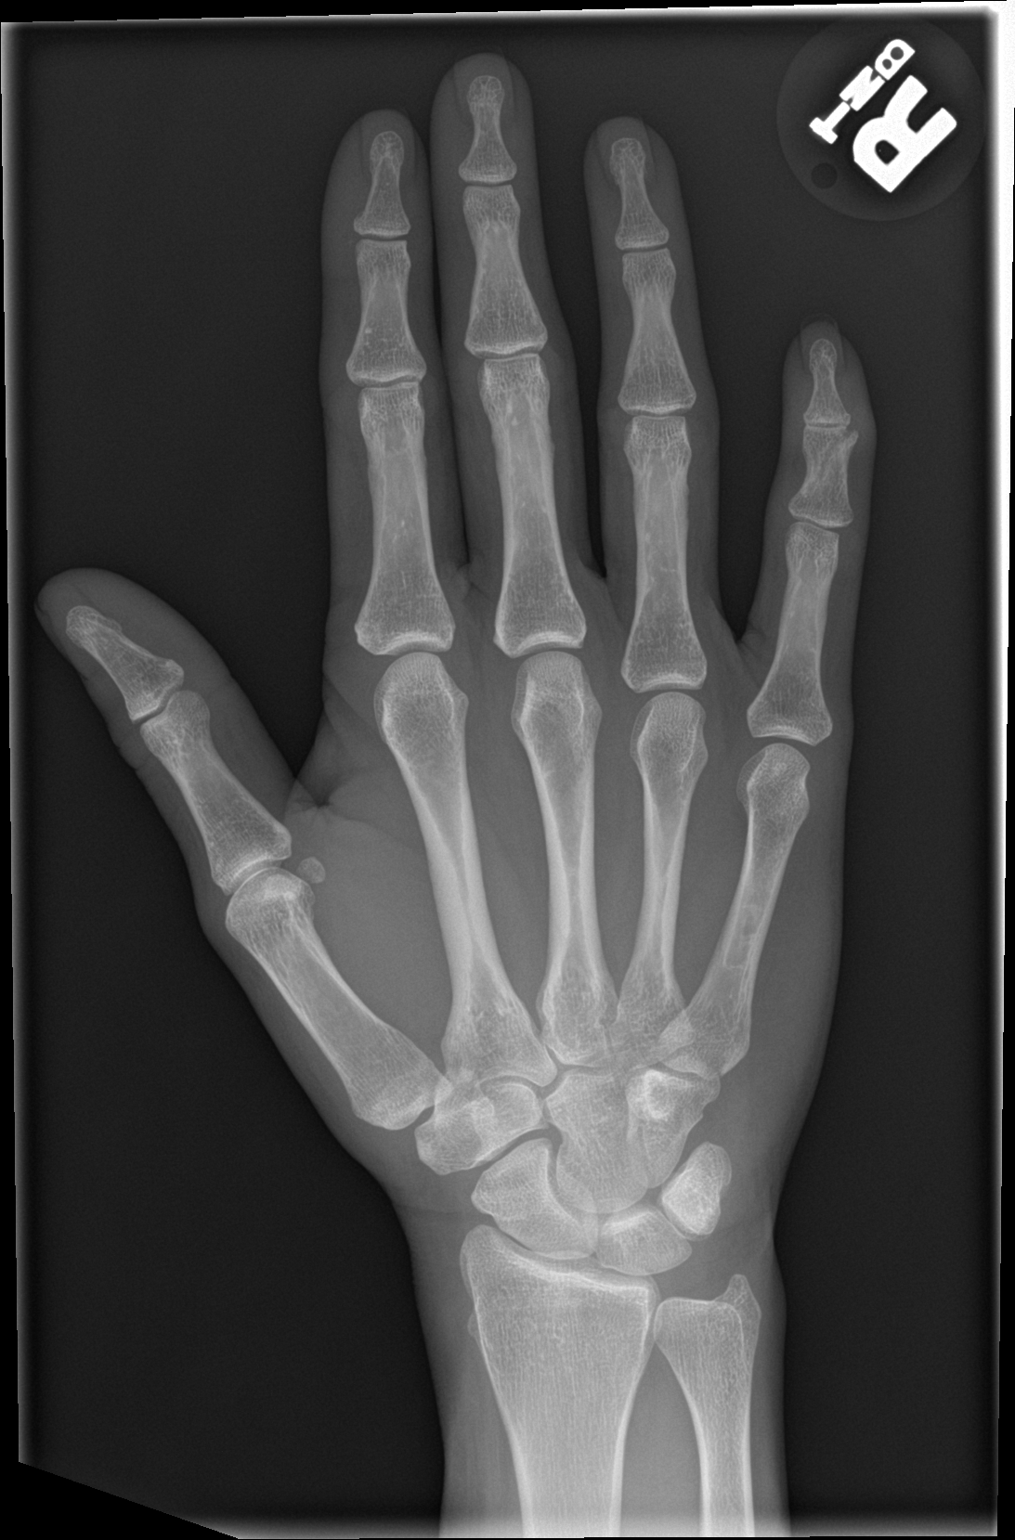

[hand obl]
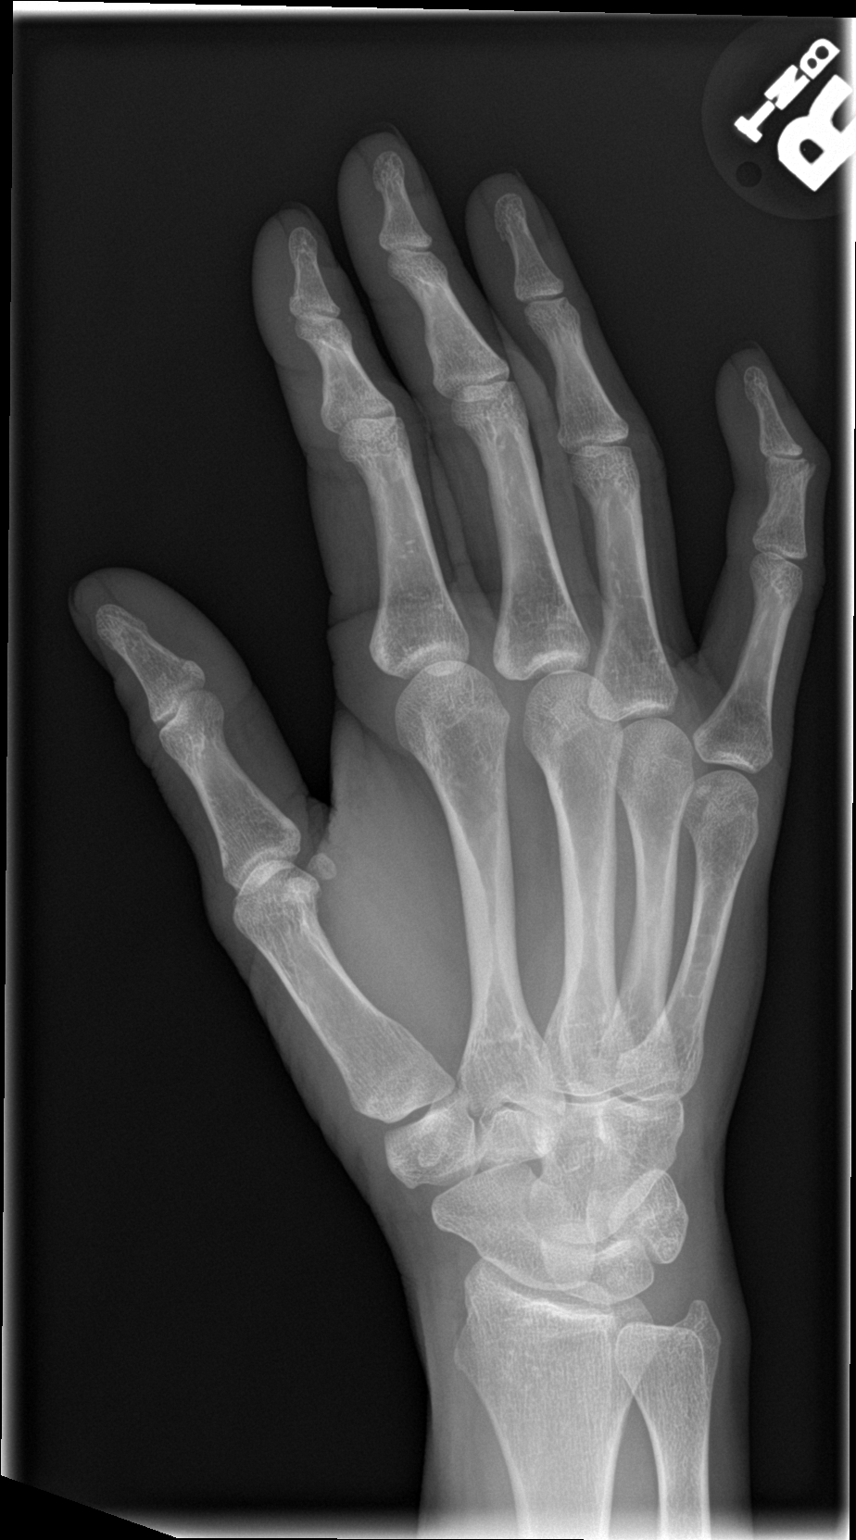

[hand lat]
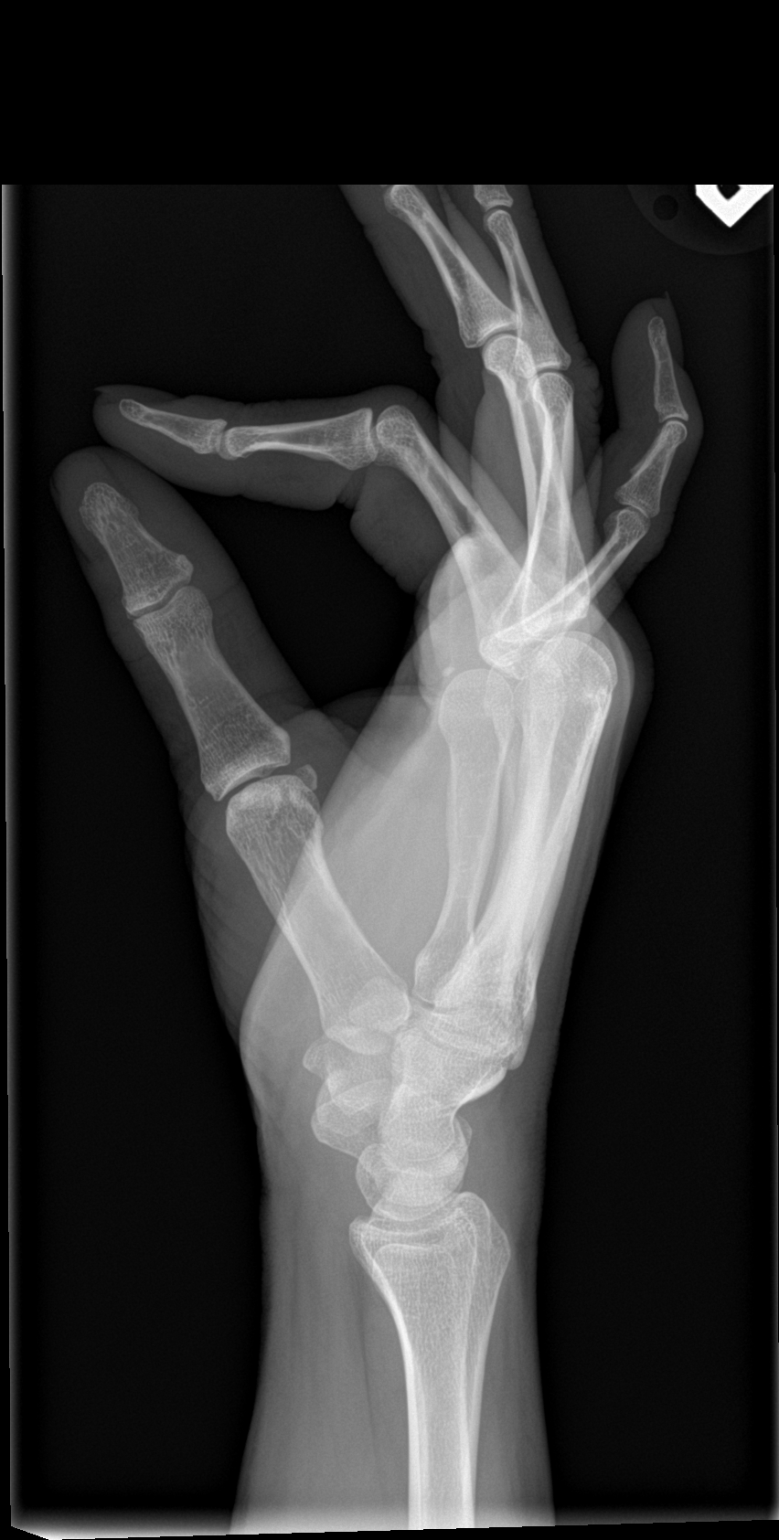

[3 of 3 positions shown; findings below may reference images not displayed]

FINDINGS: No acute bony or joint abnormality is seen. Fracture of the middle
phalanx of the right little finger is identified and has healed.
Small spur off the ulnar aspect of the head of the proximal phalanx
does not appear to impact the DIP joint. Joint spaces and alignment
are maintained. Mineralization is normal. Soft tissues are
unremarkable.
IMPRESSION: No acute abnormality or finding to explain the patient's symptoms.

Healed fracture middle phalanx right little finger as described
above.

## 2015-10-10 MED ORDER — NAPROXEN 250 MG PO TABS
500.0000 mg | ORAL_TABLET | Freq: Once | ORAL | Status: AC
  Administered 2015-10-10: 500 mg via ORAL
  Filled 2015-10-10: qty 2

## 2015-10-10 MED ORDER — NAPROXEN 500 MG PO TABS: 500.0000 mg | ORAL_TABLET | Freq: Two times a day (BID) | ORAL | 0 refills | Status: DC

## 2015-10-10 NOTE — ED Triage Notes (Signed)
States she has pain, burning and numbness in both hands, states the right hand is worse than the left, states this has been going on for 3 days, states she similar problem years ago, also has swelling in right pinky finger, states she had history of fracture in right pinky fnger

## 2015-10-10 NOTE — Discharge Instructions (Signed)
Trial of Naprosyn for the next 7 days. Make a point to follow-up with your primary care doctor as well as neurology for further evaluation.

## 2015-10-10 NOTE — ED Provider Notes (Signed)
[facility] Provider Note   CSN: [id number] Arrival date & time: 10/10/15  4098  By signing my name below, I, Placido Sou, attest that this documentation has been prepared under the direction and in the presence of Vanetta Mulders, MD. Electronically Signed: Placido Sou, ED Scribe. 10/10/15. 10:59 AM.   History   Chief Complaint Chief Complaint  Patient presents with  . Extremity Pain    HPI HPI Comments: Michelle Payne is a 40 y.o. female who presents to the Emergency Department complaining of constant, moderate, burning, bilateral, right greater than left, hand pain x 3 days. She reports associated, intermittent, numbness in all fingers of her hands, intermittent HA, and a radiation of pain from her right hand up her RUE. Pt also states she fractured her right fifth finger in January of 2017 and at the onset of her symptoms also began experiencing swelling and pain in this finger. Her pain worsens with palpation and when gripping objects. Pt denies having taken anything for her symptoms. She reports a h/o similar symptoms many years ago but denies any of this severity. Pt confirms her listed PCP. She denies other associated symptoms at this time.   The history is provided by the patient. No language interpreter was used.  Extremity Pain  This is a new problem. The current episode started more than 2 days ago. The problem occurs constantly. The problem has not changed since onset.Associated symptoms include headaches. Pertinent negatives include no chest pain, no abdominal pain and no shortness of breath. Exacerbated by: palpation and gripping. Nothing relieves the symptoms. She has tried nothing for the symptoms. The treatment provided no relief.   Past Medical History:  Diagnosis Date  . Anxiety   . Depression     Patient Active Problem List   Diagnosis Date Noted  . Radicular pain in right arm 08/08/2012  . Acute back pain 07/11/2012  . Patellar contusion 07/11/2012      History reviewed. No pertinent surgical history.  OB History    No data available       Home Medications    Prior to Admission medications   Medication Sig Start Date End Date Taking? Authorizing Provider  ALPRAZolam (XANAX) 1 MG tablet TAKE ONE TABLET BY MOUTH UP TO 3 TIMES A DAY FOR ANXIETY. 09/16/15  Yes Historical Provider, MD  citalopram (CELEXA) 20 MG tablet Take 20 mg by mouth daily.   Yes Historical Provider, MD  LamoTRIgine XR 200 MG TB24 TAKE 1 TABLET BY MOUTH EACH DAY FOR BIPOLAR 2. 07/25/15  Yes Historical Provider, MD  norethindrone (MICRONOR,CAMILA,ERRIN) 0.35 MG tablet Take 1 tablet by mouth daily.   Yes Historical Provider, MD  naproxen (NAPROSYN) 500 MG tablet Take 1 tablet (500 mg total) by mouth 2 (two) times daily. 10/10/15   Vanetta Mulders, MD    Family History No family history on file.  Social History Social History  Substance Use Topics  . Smoking status: Current Every Day Smoker    Packs/day: 0.50  . Smokeless tobacco: Never Used  . Alcohol use Not on file     Allergies   Bee venom and Ragwitek [short ragweed pollen ext]   Review of Systems Review of Systems  Constitutional: Negative for chills and fever.  HENT: Negative for congestion, postnasal drip, rhinorrhea, sinus pressure and sore throat.   Eyes: Negative for visual disturbance.  Respiratory: Negative for cough and shortness of breath.   Cardiovascular: Negative for chest pain and leg swelling.  Gastrointestinal: Negative for  abdominal pain, diarrhea, nausea and vomiting.  Genitourinary: Negative for dysuria.  Musculoskeletal: Positive for arthralgias, joint swelling and myalgias. Negative for back pain and neck pain.  Skin: Negative for rash.  Neurological: Positive for numbness and headaches. Negative for syncope, weakness and light-headedness.  Hematological: Does not bruise/bleed easily.  Psychiatric/Behavioral: Negative for confusion.  All other systems reviewed and are  negative.  Physical Exam Updated Vital Signs BP 118/72 (BP Location: Left Arm)   Pulse 67   Temp 97.8 F (36.6 C) (Oral)   Resp 15   Ht 5\' 4"  (1.626 m)   Wt 73.9 kg   LMP 09/09/2015   SpO2 100%   BMI 27.98 kg/m   Physical Exam  Constitutional: She is oriented to person, place, and time. She appears well-developed and well-nourished. No distress.  HENT:  Head: Normocephalic and atraumatic.  Mouth/Throat: Oropharynx is clear and moist.  Eyes: Conjunctivae and EOM are normal. Pupils are equal, round, and reactive to light. No scleral icterus.  Neck: Normal range of motion. Neck supple. No tracheal deviation present.  Cardiovascular: Normal rate, regular rhythm, normal heart sounds and intact distal pulses.   No murmur heard. Pulses:      Radial pulses are 2+ on the right side, and 2+ on the left side.  Pulmonary/Chest: Effort normal and breath sounds normal. No respiratory distress. She has no wheezes. She has no rales.  Abdominal: Soft. Bowel sounds are normal. There is no tenderness.  Musculoskeletal: Normal range of motion. She exhibits deformity. She exhibits no edema.  No swelling noted to the ankles.  Right hand: cap refill is 1 second in all five fingers. Fifth finger has a deformity noted to the DIP joint. No increased warmth or erythema in the fingers. No significant swelling noted.  Left hand: cap refill is 1 second in all five fingers.   Neurological: She is alert and oriented to person, place, and time. No cranial nerve deficit. She exhibits normal muscle tone. Coordination normal.  Able to move fingers and toes.   Skin: Skin is warm and dry. Capillary refill takes less than 2 seconds.  Psychiatric: She has a normal mood and affect. Her behavior is normal.  Nursing note and vitals reviewed.  ED Treatments / Results  Labs (all labs ordered are listed, but only abnormal results are displayed) Labs Reviewed  BASIC METABOLIC PANEL - Abnormal; Notable for the following:        Result Value   Glucose, Bld 105 (*)    Calcium 8.4 (*)    All other components within normal limits  CBC WITH DIFFERENTIAL/PLATELET - Abnormal; Notable for the following:    RBC 3.82 (*)    All other components within normal limits    EKG  EKG Interpretation None       Radiology Dg Hand Complete Right  Result Date: 10/10/2015 CLINICAL DATA:  Burning and numbness in both hands, chronic. History of right little finger fracture in January of this year. EXAM: RIGHT HAND - COMPLETE 3+ VIEW COMPARISON:  Plain films right little finger 03/02/2015. FINDINGS: No acute bony or joint abnormality is seen. Fracture of the middle phalanx of the right little finger is identified and has healed. Small spur off the ulnar aspect of the head of the proximal phalanx does not appear to impact the DIP joint. Joint spaces and alignment are maintained. Mineralization is normal. Soft tissues are unremarkable. IMPRESSION: No acute abnormality or finding to explain the patient's symptoms. Healed fracture middle phalanx right  little finger as described above. Electronically Signed   By: Drusilla Kanner M.D.   On: 10/10/2015 11:51    Procedures Procedures  DIAGNOSTIC STUDIES: Oxygen Saturation is 100% on RA, normal by my interpretation.    COORDINATION OF CARE: 10:58 AM Discussed next steps with pt. Pt verbalized understanding and is agreeable with the plan.    Medications Ordered in ED Medications  naproxen (NAPROSYN) tablet 500 mg (500 mg Oral Given 10/10/15 1133)     Initial Impression / Assessment and Plan / ED Course  I have reviewed the triage vital signs and the nursing notes.  Pertinent labs & imaging results that were available during my care of the patient were reviewed by me and considered in my medical decision making (see chart for details).  Clinical Course   Patient with 3 day history of bilateral hand pain associated with numbness and tingling. Right hand greater than left. Old  fracture to the right little finger in January. With some deformity at the DIP joint. X-rays show a healing fracture there. No evidence of any osteoarthritis arthritic changes to the hands otherwise. Patient's symptoms not consistent with carpal tunnel syndrome not really consistent with a cervical radiculopathy. Could possibly be consistent with a neuropathy. We'll go ahead and give a trial of treatment with Naprosyn for the next 7 days. Have patient follow-up with primary care doctor and neurology. May also need consultation by hand surgery at some future date. Would recommend connective tissue disorder labs. Patient's basic labs here without any significant abnormality.  Other considerations could include MS. But this would be a new symptom complex.  Patient symptoms improved here some with Naprosyn given orally. Stable for discharge home.   I personally performed the services described in this documentation, which was scribed in my presence. The recorded information has been reviewed and is accurate.    Final Clinical Impressions(s) / ED Diagnoses   Final diagnoses:  Bilateral hand pain  Bilateral hand numbness    New Prescriptions Discharge Medication List as of 10/10/2015  1:30 PM    START taking these medications   Details  naproxen (NAPROSYN) 500 MG tablet Take 1 tablet (500 mg total) by mouth 2 (two) times daily., Starting Fri 10/10/2015, Print          Vanetta Mulders, MD 10/10/15 [phone]

## 2015-10-10 NOTE — ED Notes (Signed)
Pt seen walking back to room at this time.

## 2015-10-10 NOTE — ED Notes (Signed)
Pt transported to XR.  

## 2015-10-10 NOTE — ED Notes (Signed)
Nurse was notified by Sedalia Mutaiane, Registration that pt was trying to walk out of side exit and was redirected to go down the hallway to the front.  Pt told diane that she was walking out to get away from her significant other.  Pt family member from room next door to pt's room states that she heard the pt next door yelling with significant other, cursing, etc.

## 2016-01-08 ENCOUNTER — Other Ambulatory Visit: Payer: Self-pay | Admitting: Orthopedic Surgery

## 2016-02-03 ENCOUNTER — Emergency Department (HOSPITAL_COMMUNITY)
Admission: EM | Admit: 2016-02-03 | Discharge: 2016-02-03 | Disposition: A | Discharge status: Home / Self Care | Payer: Medicaid Other | Attending: Dermatology | Admitting: Dermatology

## 2016-02-03 DIAGNOSIS — F17200 Nicotine dependence, unspecified, uncomplicated: Secondary | ICD-10-CM | POA: Insufficient documentation

## 2016-02-03 DIAGNOSIS — Z79899 Other long term (current) drug therapy: Secondary | ICD-10-CM | POA: Insufficient documentation

## 2016-02-03 DIAGNOSIS — M549 Dorsalgia, unspecified: Secondary | ICD-10-CM | POA: Insufficient documentation

## 2016-02-03 DIAGNOSIS — Z5321 Procedure and treatment not carried out due to patient leaving prior to being seen by health care provider: Secondary | ICD-10-CM | POA: Insufficient documentation

## 2016-02-03 NOTE — ED Triage Notes (Signed)
Pt called for Triage, no answer. 

## 2016-02-03 NOTE — ED Triage Notes (Signed)
Pt called for Triage, no answer. Pt not in waiting area. 

## 2016-02-09 DIAGNOSIS — G5602 Carpal tunnel syndrome, left upper limb: Secondary | ICD-10-CM

## 2016-02-09 HISTORY — DX: Carpal tunnel syndrome, left upper limb: G56.02

## 2016-02-09 NOTE — L&D Delivery Note (Signed)
Delivery Note At 10:33 PM a viable female was delivered via  (Presentation: OA).  APGAR:8,9 ; weight pending. Placenta status: Delivered intact with gentle traction.  3 vessels cord:  with the following complications: None.  Cord pH: Not collected  Anesthesia: None  Episiotomy: None   Lacerations: None Suture Repair: N/A Est. Blood Loss (mL): 150  Mom to postpartum.  Baby to Couplet care / Skin to Skin.  Abdoulaye Diallo 12/29/2016, 10:49 PM  I confirm that I have verified the information documented in the resident's note and that I have also personally reperformed the physical exam and all medical decision making activities.  I was gloved and present for entire delivery We had given Fentanyl just prior to delivery, when she was 8cm.  Progressed to complete and pushing within 3 minutes Baby came out crying and vigorious. SVD without incident No difficulty with shoulders No lacerations  Aviva SignsMarie L Williams, CNM

## 2016-02-23 ENCOUNTER — Encounter (HOSPITAL_BASED_OUTPATIENT_CLINIC_OR_DEPARTMENT_OTHER): Payer: Self-pay | Admitting: *Deleted

## 2016-02-24 ENCOUNTER — Ambulatory Visit (HOSPITAL_BASED_OUTPATIENT_CLINIC_OR_DEPARTMENT_OTHER): Payer: Medicaid Other | Admitting: Anesthesiology

## 2016-02-24 ENCOUNTER — Encounter (HOSPITAL_BASED_OUTPATIENT_CLINIC_OR_DEPARTMENT_OTHER)
Admission: RE | Disposition: A | Discharge status: Home / Self Care | Payer: Self-pay | Source: Ambulatory Visit | Attending: Orthopedic Surgery

## 2016-02-24 ENCOUNTER — Ambulatory Visit (HOSPITAL_BASED_OUTPATIENT_CLINIC_OR_DEPARTMENT_OTHER)
Admission: RE | Admit: 2016-02-24 | Discharge: 2016-02-24 | Disposition: A | Discharge status: Home / Self Care | Payer: Medicaid Other | Source: Ambulatory Visit | Attending: Orthopedic Surgery | Admitting: Orthopedic Surgery

## 2016-02-24 ENCOUNTER — Encounter (HOSPITAL_BASED_OUTPATIENT_CLINIC_OR_DEPARTMENT_OTHER): Payer: Self-pay | Admitting: *Deleted

## 2016-02-24 DIAGNOSIS — F329 Major depressive disorder, single episode, unspecified: Secondary | ICD-10-CM | POA: Diagnosis not present

## 2016-02-24 DIAGNOSIS — G709 Myoneural disorder, unspecified: Secondary | ICD-10-CM | POA: Insufficient documentation

## 2016-02-24 DIAGNOSIS — M1990 Unspecified osteoarthritis, unspecified site: Secondary | ICD-10-CM | POA: Insufficient documentation

## 2016-02-24 DIAGNOSIS — Z79899 Other long term (current) drug therapy: Secondary | ICD-10-CM | POA: Diagnosis not present

## 2016-02-24 DIAGNOSIS — J449 Chronic obstructive pulmonary disease, unspecified: Secondary | ICD-10-CM | POA: Diagnosis not present

## 2016-02-24 DIAGNOSIS — G5602 Carpal tunnel syndrome, left upper limb: Secondary | ICD-10-CM | POA: Diagnosis present

## 2016-02-24 DIAGNOSIS — R51 Headache: Secondary | ICD-10-CM | POA: Diagnosis not present

## 2016-02-24 DIAGNOSIS — F419 Anxiety disorder, unspecified: Secondary | ICD-10-CM | POA: Diagnosis not present

## 2016-02-24 DIAGNOSIS — F17200 Nicotine dependence, unspecified, uncomplicated: Secondary | ICD-10-CM | POA: Diagnosis not present

## 2016-02-24 HISTORY — DX: Unspecified osteoarthritis, unspecified site: M19.90

## 2016-02-24 HISTORY — DX: Chronic obstructive pulmonary disease, unspecified: J44.9

## 2016-02-24 HISTORY — DX: Headache: R51

## 2016-02-24 HISTORY — PX: CARPAL TUNNEL RELEASE: SHX101

## 2016-02-24 HISTORY — DX: Carpal tunnel syndrome, left upper limb: G56.02

## 2016-02-24 HISTORY — DX: Headache, unspecified: R51.9

## 2016-02-24 SURGERY — CARPAL TUNNEL RELEASE
Anesthesia: General | Laterality: Left

## 2016-02-24 MED ORDER — PROPOFOL 500 MG/50ML IV EMUL
INTRAVENOUS | Status: AC
  Filled 2016-02-24: qty 100

## 2016-02-24 MED ORDER — MIDAZOLAM HCL 2 MG/2ML IJ SOLN
INTRAMUSCULAR | Status: AC
  Filled 2016-02-24: qty 2

## 2016-02-24 MED ORDER — LIDOCAINE HCL (PF) 0.5 % IJ SOLN
INTRAMUSCULAR | Status: DC | PRN
  Administered 2016-02-24: 30 mL via INTRAVENOUS

## 2016-02-24 MED ORDER — CEFAZOLIN SODIUM-DEXTROSE 2-4 GM/100ML-% IV SOLN
2.0000 g | INTRAVENOUS | Status: AC
  Administered 2016-02-24: 2 g via INTRAVENOUS

## 2016-02-24 MED ORDER — CEFAZOLIN SODIUM-DEXTROSE 2-4 GM/100ML-% IV SOLN
INTRAVENOUS | Status: AC
  Filled 2016-02-24: qty 100

## 2016-02-24 MED ORDER — HYDROMORPHONE HCL 1 MG/ML IJ SOLN: 0.2500 mg | INTRAMUSCULAR | Status: DC | PRN

## 2016-02-24 MED ORDER — FENTANYL CITRATE (PF) 100 MCG/2ML IJ SOLN
INTRAMUSCULAR | Status: AC
  Filled 2016-02-24: qty 2

## 2016-02-24 MED ORDER — LACTATED RINGERS IV SOLN
INTRAVENOUS | Status: DC
  Administered 2016-02-24: 13:00:00 via INTRAVENOUS

## 2016-02-24 MED ORDER — PROPOFOL 500 MG/50ML IV EMUL
INTRAVENOUS | Status: DC | PRN
  Administered 2016-02-24: 75 ug/kg/min via INTRAVENOUS

## 2016-02-24 MED ORDER — 0.9 % SODIUM CHLORIDE (POUR BTL) OPTIME
TOPICAL | Status: DC | PRN
  Administered 2016-02-24: 500 mL

## 2016-02-24 MED ORDER — LIDOCAINE 2% (20 MG/ML) 5 ML SYRINGE
INTRAMUSCULAR | Status: AC
  Filled 2016-02-24: qty 5

## 2016-02-24 MED ORDER — HYDROCODONE-ACETAMINOPHEN 5-325 MG PO TABS: ORAL_TABLET | ORAL | 0 refills | Status: DC

## 2016-02-24 MED ORDER — SCOPOLAMINE 1 MG/3DAYS TD PT72: 1.0000 | MEDICATED_PATCH | Freq: Once | TRANSDERMAL | Status: DC | PRN

## 2016-02-24 MED ORDER — FENTANYL CITRATE (PF) 100 MCG/2ML IJ SOLN: 25.0000 ug | INTRAMUSCULAR | Status: DC | PRN

## 2016-02-24 MED ORDER — BUPIVACAINE HCL (PF) 0.25 % IJ SOLN
INTRAMUSCULAR | Status: DC | PRN
  Administered 2016-02-24: 10 mL

## 2016-02-24 MED ORDER — FENTANYL CITRATE (PF) 100 MCG/2ML IJ SOLN
50.0000 ug | INTRAMUSCULAR | Status: DC | PRN
  Administered 2016-02-24 (×2): 50 ug via INTRAVENOUS

## 2016-02-24 MED ORDER — CHLORHEXIDINE GLUCONATE 4 % EX LIQD
60.0000 mL | Freq: Once | CUTANEOUS | Status: DC
Start: 2016-02-24 — End: 2016-02-24

## 2016-02-24 MED ORDER — MIDAZOLAM HCL 2 MG/2ML IJ SOLN
1.0000 mg | INTRAMUSCULAR | Status: DC | PRN
  Administered 2016-02-24: 2 mg via INTRAVENOUS

## 2016-02-24 MED ORDER — ONDANSETRON HCL 4 MG/2ML IJ SOLN
INTRAMUSCULAR | Status: DC | PRN
  Administered 2016-02-24: 4 mg via INTRAVENOUS

## 2016-02-24 SURGICAL SUPPLY — 34 items
BANDAGE ACE 3X5.8 VEL STRL LF (GAUZE/BANDAGES/DRESSINGS) ×2 IMPLANT
BLADE SURG 15 STRL LF DISP TIS (BLADE) ×2 IMPLANT
BLADE SURG 15 STRL SS (BLADE) ×4
BNDG CMPR 9X4 STRL LF SNTH (GAUZE/BANDAGES/DRESSINGS) ×1
BNDG ESMARK 4X9 LF (GAUZE/BANDAGES/DRESSINGS) ×1 IMPLANT
BNDG GAUZE ELAST 4 BULKY (GAUZE/BANDAGES/DRESSINGS) ×2 IMPLANT
CHLORAPREP W/TINT 26ML (MISCELLANEOUS) ×2 IMPLANT
CORDS BIPOLAR (ELECTRODE) ×2 IMPLANT
COVER BACK TABLE 60X90IN (DRAPES) ×2 IMPLANT
COVER MAYO STAND STRL (DRAPES) ×2 IMPLANT
CUFF TOURNIQUET SINGLE 18IN (TOURNIQUET CUFF) ×2 IMPLANT
DRAPE EXTREMITY T 121X128X90 (DRAPE) ×2 IMPLANT
DRAPE SURG 17X23 STRL (DRAPES) ×2 IMPLANT
DRSG PAD ABDOMINAL 8X10 ST (GAUZE/BANDAGES/DRESSINGS) ×2 IMPLANT
GAUZE SPONGE 4X4 12PLY STRL (GAUZE/BANDAGES/DRESSINGS) ×2 IMPLANT
GAUZE XEROFORM 1X8 LF (GAUZE/BANDAGES/DRESSINGS) ×2 IMPLANT
GLOVE BIO SURGEON STRL SZ7.5 (GLOVE) ×2 IMPLANT
GLOVE BIOGEL PI IND STRL 8 (GLOVE) ×1 IMPLANT
GLOVE BIOGEL PI INDICATOR 8 (GLOVE) ×1
GOWN STRL REUS W/ TWL LRG LVL3 (GOWN DISPOSABLE) ×1 IMPLANT
GOWN STRL REUS W/TWL LRG LVL3 (GOWN DISPOSABLE) ×2
GOWN STRL REUS W/TWL XL LVL3 (GOWN DISPOSABLE) ×2 IMPLANT
NDL HYPO 25X1 1.5 SAFETY (NEEDLE) ×1 IMPLANT
NEEDLE HYPO 25X1 1.5 SAFETY (NEEDLE) ×2 IMPLANT
NS IRRIG 1000ML POUR BTL (IV SOLUTION) ×2 IMPLANT
PACK BASIN DAY SURGERY FS (CUSTOM PROCEDURE TRAY) ×2 IMPLANT
PADDING CAST ABS 4INX4YD NS (CAST SUPPLIES)
PADDING CAST ABS COTTON 4X4 ST (CAST SUPPLIES) ×1 IMPLANT
STOCKINETTE 4X48 STRL (DRAPES) ×2 IMPLANT
SUT ETHILON 4 0 PS 2 18 (SUTURE) ×2 IMPLANT
SYR BULB 3OZ (MISCELLANEOUS) ×2 IMPLANT
SYR CONTROL 10ML LL (SYRINGE) ×2 IMPLANT
TOWEL OR 17X24 6PK STRL BLUE (TOWEL DISPOSABLE) ×3 IMPLANT
UNDERPAD 30X30 (UNDERPADS AND DIAPERS) ×2 IMPLANT

## 2016-02-24 NOTE — Anesthesia Procedure Notes (Signed)
Anesthesia Regional Block:  Bier block (IV Regional)  Pre-Anesthetic Checklist: ,, timeout performed, Correct Patient, Correct Site, Correct Laterality, Correct Procedure,, site marked, surgical consent,, at surgeon's request  Laterality: Left     Needles:  Injection technique: Single-shot  Needle Type: Other      Needle Gauge: 20 and 20 G    Additional Needles: Bier block (IV Regional) Narrative:   Performed by: Personally       

## 2016-02-24 NOTE — H&P (Signed)
  Michelle Payne is an 41 y.o. female.   Chief Complaint: left carpal tunnel syndrome HPI: 41 yo female with bilateral carpal tunnel syndrome.  Numbness in fingers and pain in wrists.  Nocturnal symptoms.  Positive nerve conduction studies.  Allergies:  Allergies  Allergen Reactions  . Bee Venom Shortness Of Breath and Swelling    Past Medical History:  Diagnosis Date  . Anxiety   . Arthritis    knees  . Carpal tunnel syndrome on left 02/2016  . COPD (chronic obstructive pulmonary disease) (HCC)    denies home O2  . Depression   . Headache    unspecified; 2 x/week    Past Surgical History:  Procedure Laterality Date  . TOOTH EXTRACTION      Family History: History reviewed. No pertinent family history.  Social History:   reports that she has been smoking.  She has a 7.50 pack-year smoking history. She has never used smokeless tobacco. She reports that she does not drink alcohol or use drugs.  Medications: Medications Prior to Admission  Medication Sig Dispense Refill  . Calcium Carbonate (CALCIUM 600 PO) Take by mouth.    Marland Kitchen. albuterol (PROVENTIL HFA;VENTOLIN HFA) 108 (90 Base) MCG/ACT inhaler Inhale into the lungs every 6 (six) hours as needed for wheezing or shortness of breath.      No results found for this or any previous visit (from the past 48 hour(s)).  No results found.   A comprehensive review of systems was negative.  Blood pressure 130/84, pulse 72, temperature 98.6 F (37 C), temperature source Oral, resp. rate 18, height 5\' 4"  (1.626 m), weight 85.4 kg (188 lb 3.2 oz), last menstrual period 02/16/2016, SpO2 100 %.  General appearance: alert, cooperative and appears stated age Head: Normocephalic, without obvious abnormality, atraumatic Neck: supple, symmetrical, trachea midline Resp: clear to auscultation bilaterally Cardio: regular rate and rhythm GI: non-tender Extremities: Intact sensation and capillary refill all digits.  +epl/fpl/io.  No wounds.   Pulses: 2+ and symmetric Skin: Skin color, texture, turgor normal. No rashes or lesions Neurologic: Grossly normal Incision/Wound:none  Assessment/Plan Left carpal tunnel syndrome.  Non operative and operative treatment options were discussed with the patient and patient wishes to proceed with operative treatment. Risks, benefits, and alternatives of surgery were discussed and the patient agrees with the plan of care.   Tareka Jhaveri R 02/24/2016, 1:32 PM

## 2016-02-24 NOTE — Anesthesia Postprocedure Evaluation (Signed)
Anesthesia Post Note  Patient: Michelle Payne  Procedure(s) Performed: Procedure(s) (LRB): LEFT CARPAL TUNNEL RELEASE (Left)  Patient location during evaluation: PACU Anesthesia Type: MAC and Bier Block Level of consciousness: awake Pain management: pain level controlled Vital Signs Assessment: post-procedure vital signs reviewed and stable Respiratory status: spontaneous breathing Cardiovascular status: stable Anesthetic complications: no       Last Vitals:  Vitals:   02/24/16 1445 02/24/16 1521  BP: 124/83 122/84  Pulse: 64 71  Resp: 16 18  Temp:  36.7 C    Last Pain:  Vitals:   02/24/16 1521  TempSrc: Oral  PainSc:                  Michelle Payne

## 2016-02-24 NOTE — Anesthesia Preprocedure Evaluation (Addendum)
Anesthesia Evaluation  Patient identified by MRN, date of birth, ID band Patient awake    Reviewed: Allergy & Precautions, NPO status   Airway Mallampati: II  TM Distance: >3 FB     Dental   Pulmonary COPD, Current Smoker,    breath sounds clear to auscultation       Cardiovascular negative cardio ROS   Rhythm:Regular Rate:Normal     Neuro/Psych  Headaches,  Neuromuscular disease    GI/Hepatic negative GI ROS, Neg liver ROS,   Endo/Other  negative endocrine ROS  Renal/GU negative Renal ROS     Musculoskeletal  (+) Arthritis ,   Abdominal   Peds  Hematology   Anesthesia Other Findings   Reproductive/Obstetrics                             Anesthesia Physical Anesthesia Plan  ASA: III  Anesthesia Plan: MAC and Bier Block   Post-op Pain Management:    Induction: Intravenous  Airway Management Planned: Simple Face Mask  Additional Equipment:   Intra-op Plan:   Post-operative Plan:   Informed Consent: I have reviewed the patients History and Physical, chart, labs and discussed the procedure including the risks, benefits and alternatives for the proposed anesthesia with the patient or authorized representative who has indicated his/her understanding and acceptance.   Dental advisory given  Plan Discussed with: CRNA and Anesthesiologist  Anesthesia Plan Comments:        Anesthesia Quick Evaluation

## 2016-02-24 NOTE — Op Note (Signed)
02/24/2016 [facility]                              OPERATIVE REPORT   PREOPERATIVE DIAGNOSIS:  Left carpal tunnel syndrome.  POSTOPERATIVE DIAGNOSIS:  Left carpal tunnel syndrome.  PROCEDURE:  Left carpal tunnel release.  SURGEON:  Betha Loa, Michelle Payne  ASSISTANT:  none.  ANESTHESIA:  Bier block with sedation  IV FLUIDS:  Per anesthesia flow sheet.  ESTIMATED BLOOD LOSS:  Minimal.  COMPLICATIONS:  None.  SPECIMENS:  None.  TOURNIQUET TIME:    Total Tourniquet Time Documented: Forearm (Left) - -29 minutes Total: Forearm (Left) - -29 minutes   DISPOSITION:  Stable to PACU.  LOCATION: [facility]  INDICATIONS:  41 yo female with tingling and pain of bilateral hands and nocturnal symptoms.  She has positive nerve conduction studies.   She wishes to have a carpal tunnel release for management of her symptoms.  Risks, benefits and alternatives of surgery were discussed including the risk of blood loss; infection; damage to nerves, vessels, tendons, ligaments, bone; failure of surgery; need for additional surgery; complications with wound healing; continued pain; recurrence of carpal tunnel syndrome; and damage to motor branch. She voiced understanding of these risks and elected to proceed.   OPERATIVE COURSE:  After being identified preoperatively by myself, the patient and I agreed upon the procedure and site of procedure.  The surgical site was marked.  The risks, benefits, and alternatives of the surgery were reviewed and she wished to proceed.  Surgical consent had been signed.  She was given IV Ancef as preoperative antibiotic prophylaxis.  She was transferred to the operating room and placed on the operating room table in supine position with the Left upper extremity on an armboard.  Bier block and sedation was induced by Anesthesiology.  Left upper extremity was prepped and draped in normal sterile orthopaedic fashion.  A surgical pause was performed  between the surgeons, anesthesia, and operating room staff, and all were in agreement as to the patient, procedure, and site of procedure.  Tourniquet at the proximal aspect of the forearm had been inflated for the Bier block.  Incision was made over the transverse carpal ligament and carried into the subcutaneous tissues by spreading technique.  Bipolar electrocautery was used to obtain hemostasis.  The palmar fascia was sharply incised.  The transverse carpal ligament was identified and sharply incised.  It was incised distally first.  Care was taken to ensure complete decompression distally.  It was then incised proximally.  Scissors were used to split the distal aspect of the volar antebrachial fascia.  A finger was placed into the wound to ensure complete decompression, which was the case.  The nerve was examined.  It was flattened and hyperemic.  The motor branch was identified and was intact.  The wound was copiously irrigated with sterile saline.  It was then closed with 4-0 nylon in a horizontal mattress fashion.  It was injected with 10 mL of 0.25% plain Marcaine to aid in postoperative analgesia.  It was dressed with sterile Xeroform, 4x4s, an ABD, and wrapped with Kerlix and an Ace bandage.  Tourniquet was deflated at 29 minutes.  Fingertips were pink with brisk capillary refill after deflation of the tourniquet.  Operative drapes were broken down.  The patient was awoken from anesthesia safely.  She was transferred back to stretcher and taken to the PACU in stable condition.  I will see her back in the office in 1 week for postoperative followup.  I will give her a prescription for norco 5/325 1-2 tabs PO q6 hours prn pain, dispense #20.    Michelle RibasKUZMA,Michelle Kirshner Payne, Michelle Payne Electronically signed, 02/24/16

## 2016-02-24 NOTE — Brief Op Note (Signed)
02/24/2016  2:24 PM  PATIENT:  Lysle MoralesNicole Bullard  41 y.o. female  PRE-OPERATIVE DIAGNOSIS:  left carpal tunnel syndrome  g56.02  POST-OPERATIVE DIAGNOSIS:  left carpal tunnel syndrome  g56.02  PROCEDURE:  Procedure(s): LEFT CARPAL TUNNEL RELEASE (Left)  SURGEON:  Surgeon(s) and Role:    * Betha LoaKevin Gerell Fortson, MD - Primary  PHYSICIAN ASSISTANT:   ASSISTANTS: none   ANESTHESIA:   Bier block with sedation  EBL:  Total I/O In: 400 [I.V.:400] Out: -   BLOOD ADMINISTERED:none  DRAINS: none   LOCAL MEDICATIONS USED:  MARCAINE     SPECIMEN:  No Specimen  DISPOSITION OF SPECIMEN:  N/A  COUNTS:  YES  TOURNIQUET:   Total Tourniquet Time Documented: Forearm (Left) - -29 minutes Total: Forearm (Left) - -29 minutes   DICTATION: .Note written in EPIC  PLAN OF CARE: Discharge to home after PACU  PATIENT DISPOSITION:  PACU - hemodynamically stable.

## 2016-02-24 NOTE — Discharge Instructions (Addendum)

## 2016-02-24 NOTE — Transfer of Care (Signed)
Immediate Anesthesia Transfer of Care Note  Patient: Michelle Payne  Procedure(s) Performed: Procedure(s): LEFT CARPAL TUNNEL RELEASE (Left)  Patient Location: PACU  Anesthesia Type:Bier block  Level of Consciousness: awake, alert , oriented and patient cooperative  Airway & Oxygen Therapy: Patient Spontanous Breathing and Patient connected to face mask oxygen  Post-op Assessment: Report given to RN and Post -op Vital signs reviewed and stable  Post vital signs: Reviewed and stable  Last Vitals:  Vitals:   02/24/16 1230  BP: 130/84  Pulse: 72  Resp: 18  Temp: 37 C    Last Pain:  Vitals:   02/24/16 1230  TempSrc: Oral  PainSc: 6          Complications: No apparent anesthesia complications

## 2016-02-25 ENCOUNTER — Encounter (HOSPITAL_BASED_OUTPATIENT_CLINIC_OR_DEPARTMENT_OTHER): Payer: Self-pay | Admitting: Orthopedic Surgery

## 2016-03-01 NOTE — Addendum Note (Signed)
Addendum  created 03/01/16 2314 by Dorris Singhharlene Dalisha Shively, MD   Sign clinical note

## 2016-05-25 ENCOUNTER — Encounter: Payer: Self-pay | Admitting: Adult Health

## 2016-05-25 ENCOUNTER — Ambulatory Visit (INDEPENDENT_AMBULATORY_CARE_PROVIDER_SITE_OTHER): Payer: Medicaid Other | Admitting: Adult Health

## 2016-05-25 VITALS — BP 116/66 | HR 82 | Ht 64.0 in | Wt 201.0 lb

## 2016-05-25 DIAGNOSIS — Z3201 Encounter for pregnancy test, result positive: Secondary | ICD-10-CM

## 2016-05-25 DIAGNOSIS — F17200 Nicotine dependence, unspecified, uncomplicated: Secondary | ICD-10-CM | POA: Insufficient documentation

## 2016-05-25 DIAGNOSIS — Z3490 Encounter for supervision of normal pregnancy, unspecified, unspecified trimester: Secondary | ICD-10-CM

## 2016-05-25 DIAGNOSIS — O3680X0 Pregnancy with inconclusive fetal viability, not applicable or unspecified: Secondary | ICD-10-CM

## 2016-05-25 DIAGNOSIS — N912 Amenorrhea, unspecified: Secondary | ICD-10-CM

## 2016-05-25 DIAGNOSIS — O09529 Supervision of elderly multigravida, unspecified trimester: Secondary | ICD-10-CM

## 2016-05-25 LAB — POCT URINE PREGNANCY: Preg Test, Ur: POSITIVE — AB

## 2016-05-25 MED ORDER — PRENATAL PLUS 27-1 MG PO TABS: 1.0000 | ORAL_TABLET | Freq: Every day | ORAL | 11 refills | Status: DC

## 2016-05-25 NOTE — Progress Notes (Signed)
Subjective:     Patient ID: Michelle Payne, female   DOB: 07/18/1975, 41 y.o.   MRN: [id number]  HPI Michelle Payne is a 41 year old Hispanic female in for UPT, has missed a period and had +HPT about 3 weeks ago, and stopped xanax and other meds then, has done well, and has seen Dr Sudie Bailey and had +UPT.  PCP is Dr Sudie Bailey.   Review of Systems +missed period +nausea Spotted x 1  Reviewed past medical,surgical, social and family history. Reviewed medications and allergies.     Objective:   Physical Exam BP 116/66 (BP Location: Right Arm, Patient Position: Sitting, Cuff Size: Normal)   Pulse 82   Ht  (1.626 m)   Wt 201 lb (91.2 kg)   LMP  (LMP Unknown)   BMI 34.50 kg/m UPT +, I am guessing about 13+1 weeks, she said had period in [place] would put her due about 11/29/16 Had +FHR 160 by doppler. Skin warm and dry. Neck: mid line trachea, normal thyroid, good ROM, no lymphadenopathy noted. Lungs: clear to ausculation bilaterally. Cardiovascular: regular rate and rhythm.Abdomen is soft and non tender.     Assessment:     1. Positive pregnancy test   2. Pregnancy, unspecified gestational age   60. Antepartum multigravida of advanced maternal age   53. Smoker   5. Encounter to determine fetal viability of pregnancy, single or unspecified fetus       Plan:     Rx prenatal Plus #30 take 1 daily with 11 refills Return in 1 week for dating [place] Try to stop smoking Eat often

## 2016-06-01 ENCOUNTER — Ambulatory Visit (INDEPENDENT_AMBULATORY_CARE_PROVIDER_SITE_OTHER): Payer: Medicaid Other

## 2016-06-01 DIAGNOSIS — O3680X0 Pregnancy with inconclusive fetal viability, not applicable or unspecified: Secondary | ICD-10-CM

## 2016-06-01 NOTE — Progress Notes (Signed)
[place] 9+4 wks,single IUP w/ys,positive fht,normal ovaries bilat,crl 28.80 mm,EDD 12/31/2016

## 2016-06-08 ENCOUNTER — Ambulatory Visit: Payer: Medicaid Other | Admitting: *Deleted

## 2016-06-08 ENCOUNTER — Encounter: Payer: Medicaid Other | Admitting: Advanced Practice Midwife

## 2016-06-09 ENCOUNTER — Encounter: Payer: Self-pay | Admitting: Psychiatry

## 2016-06-09 ENCOUNTER — Emergency Department (HOSPITAL_COMMUNITY)
Admission: EM | Admit: 2016-06-09 | Discharge: 2016-06-09 | Payer: Medicaid Other | Attending: Emergency Medicine | Admitting: Emergency Medicine

## 2016-06-09 ENCOUNTER — Inpatient Hospital Stay
Admit: 2016-06-09 | Discharge: 2016-06-14 | DRG: 880 | Disposition: A | Discharge status: Home / Self Care | Payer: Medicaid Other | Attending: Psychiatry | Admitting: Psychiatry

## 2016-06-09 ENCOUNTER — Encounter (HOSPITAL_COMMUNITY): Payer: Self-pay | Admitting: *Deleted

## 2016-06-09 DIAGNOSIS — O99281 Endocrine, nutritional and metabolic diseases complicating pregnancy, first trimester: Secondary | ICD-10-CM | POA: Insufficient documentation

## 2016-06-09 DIAGNOSIS — M1990 Unspecified osteoarthritis, unspecified site: Secondary | ICD-10-CM | POA: Diagnosis present

## 2016-06-09 DIAGNOSIS — F1220 Cannabis dependence, uncomplicated: Secondary | ICD-10-CM | POA: Diagnosis present

## 2016-06-09 DIAGNOSIS — E876 Hypokalemia: Secondary | ICD-10-CM

## 2016-06-09 DIAGNOSIS — F1410 Cocaine abuse, uncomplicated: Secondary | ICD-10-CM

## 2016-06-09 DIAGNOSIS — O99321 Drug use complicating pregnancy, first trimester: Secondary | ICD-10-CM | POA: Diagnosis present

## 2016-06-09 DIAGNOSIS — O99341 Other mental disorders complicating pregnancy, first trimester: Secondary | ICD-10-CM | POA: Insufficient documentation

## 2016-06-09 DIAGNOSIS — Z91030 Bee allergy status: Secondary | ICD-10-CM | POA: Diagnosis not present

## 2016-06-09 DIAGNOSIS — F17200 Nicotine dependence, unspecified, uncomplicated: Secondary | ICD-10-CM | POA: Diagnosis not present

## 2016-06-09 DIAGNOSIS — Z818 Family history of other mental and behavioral disorders: Secondary | ICD-10-CM | POA: Diagnosis not present

## 2016-06-09 DIAGNOSIS — F17210 Nicotine dependence, cigarettes, uncomplicated: Secondary | ICD-10-CM | POA: Diagnosis present

## 2016-06-09 DIAGNOSIS — F1994 Other psychoactive substance use, unspecified with psychoactive substance-induced mood disorder: Secondary | ICD-10-CM | POA: Diagnosis present

## 2016-06-09 DIAGNOSIS — O2341 Unspecified infection of urinary tract in pregnancy, first trimester: Secondary | ICD-10-CM | POA: Diagnosis not present

## 2016-06-09 DIAGNOSIS — F1420 Cocaine dependence, uncomplicated: Secondary | ICD-10-CM | POA: Diagnosis present

## 2016-06-09 DIAGNOSIS — J449 Chronic obstructive pulmonary disease, unspecified: Secondary | ICD-10-CM | POA: Diagnosis present

## 2016-06-09 DIAGNOSIS — O99331 Smoking (tobacco) complicating pregnancy, first trimester: Secondary | ICD-10-CM | POA: Diagnosis present

## 2016-06-09 DIAGNOSIS — R45851 Suicidal ideations: Secondary | ICD-10-CM | POA: Diagnosis present

## 2016-06-09 DIAGNOSIS — Z3A10 10 weeks gestation of pregnancy: Secondary | ICD-10-CM | POA: Insufficient documentation

## 2016-06-09 DIAGNOSIS — F3181 Bipolar II disorder: Secondary | ICD-10-CM | POA: Diagnosis present

## 2016-06-09 DIAGNOSIS — Z79899 Other long term (current) drug therapy: Secondary | ICD-10-CM | POA: Diagnosis not present

## 2016-06-09 DIAGNOSIS — F332 Major depressive disorder, recurrent severe without psychotic features: Secondary | ICD-10-CM

## 2016-06-09 DIAGNOSIS — O99511 Diseases of the respiratory system complicating pregnancy, first trimester: Secondary | ICD-10-CM | POA: Diagnosis present

## 2016-06-09 DIAGNOSIS — Z3490 Encounter for supervision of normal pregnancy, unspecified, unspecified trimester: Secondary | ICD-10-CM

## 2016-06-09 DIAGNOSIS — N390 Urinary tract infection, site not specified: Secondary | ICD-10-CM | POA: Insufficient documentation

## 2016-06-09 HISTORY — DX: Bipolar II disorder: F31.81

## 2016-06-09 LAB — URINALYSIS, ROUTINE W REFLEX MICROSCOPIC
BACTERIA UA: NONE SEEN
Bilirubin Urine: NEGATIVE
Glucose, UA: NEGATIVE mg/dL
Hgb urine dipstick: NEGATIVE
Ketones, ur: 20 mg/dL — AB
Nitrite: NEGATIVE
PROTEIN: 100 mg/dL — AB
Specific Gravity, Urine: 1.035 — ABNORMAL HIGH (ref 1.005–1.030)
pH: 5 (ref 5.0–8.0)

## 2016-06-09 LAB — CBC WITH DIFFERENTIAL/PLATELET
BASOS PCT: 0 %
Basophils Absolute: 0.1 10*3/uL (ref 0.0–0.1)
Eosinophils Absolute: 0.1 10*3/uL (ref 0.0–0.7)
Eosinophils Relative: 1 %
HEMATOCRIT: 38.6 % (ref 36.0–46.0)
Hemoglobin: 13.3 g/dL (ref 12.0–15.0)
Lymphocytes Relative: 23 %
Lymphs Abs: 3.1 10*3/uL (ref 0.7–4.0)
MCH: 32.9 pg (ref 26.0–34.0)
MCHC: 34.5 g/dL (ref 30.0–36.0)
MCV: 95.5 fL (ref 78.0–100.0)
MONO ABS: 1 10*3/uL (ref 0.1–1.0)
MONOS PCT: 8 %
NEUTROS ABS: 9.4 10*3/uL — AB (ref 1.7–7.7)
Neutrophils Relative %: 68 %
Platelets: 219 10*3/uL (ref 150–400)
RBC: 4.04 MIL/uL (ref 3.87–5.11)
RDW: 14.3 % (ref 11.5–15.5)
WBC: 13.7 10*3/uL — ABNORMAL HIGH (ref 4.0–10.5)

## 2016-06-09 LAB — COMPREHENSIVE METABOLIC PANEL
ALK PHOS: 88 U/L (ref 38–126)
ALT: 16 U/L (ref 14–54)
AST: 27 U/L (ref 15–41)
Albumin: 3.6 g/dL (ref 3.5–5.0)
Anion gap: 10 (ref 5–15)
BILIRUBIN TOTAL: 0.8 mg/dL (ref 0.3–1.2)
BUN: 12 mg/dL (ref 6–20)
CALCIUM: 9.2 mg/dL (ref 8.9–10.3)
CO2: 20 mmol/L — AB (ref 22–32)
CREATININE: 0.67 mg/dL (ref 0.44–1.00)
Chloride: 105 mmol/L (ref 101–111)
Glucose, Bld: 130 mg/dL — ABNORMAL HIGH (ref 65–99)
Potassium: 3.1 mmol/L — ABNORMAL LOW (ref 3.5–5.1)
SODIUM: 135 mmol/L (ref 135–145)
TOTAL PROTEIN: 7.4 g/dL (ref 6.5–8.1)

## 2016-06-09 LAB — ACETAMINOPHEN LEVEL

## 2016-06-09 LAB — SALICYLATE LEVEL: Salicylate Lvl: <7 mg/dL (ref 2.8–30.0)

## 2016-06-09 LAB — RAPID URINE DRUG SCREEN, HOSP PERFORMED
AMPHETAMINES: NOT DETECTED
BENZODIAZEPINES: POSITIVE — AB
Barbiturates: NOT DETECTED
Cocaine: POSITIVE — AB
OPIATES: NOT DETECTED
Tetrahydrocannabinol: POSITIVE — AB

## 2016-06-09 LAB — HCG, QUANTITATIVE, PREGNANCY: HCG, BETA CHAIN, QUANT, S: 74910 m[IU]/mL — AB (ref <5)

## 2016-06-09 LAB — ETHANOL: Alcohol, Ethyl (B): <5 mg/dL (ref <5)

## 2016-06-09 MED ORDER — MAGNESIUM HYDROXIDE 400 MG/5ML PO SUSP: 30.0000 mL | Freq: Every day | ORAL | Status: DC | PRN

## 2016-06-09 MED ORDER — ACETAMINOPHEN 325 MG PO TABS: 650.0000 mg | ORAL_TABLET | Freq: Four times a day (QID) | ORAL | Status: DC | PRN

## 2016-06-09 MED ORDER — FOLIC ACID 1 MG PO TABS
2.0000 mg | ORAL_TABLET | Freq: Every day | ORAL | Status: DC
  Administered 2016-06-09 – 2016-06-14 (×6): 2 mg via ORAL
  Filled 2016-06-09 (×6): qty 2

## 2016-06-09 MED ORDER — POTASSIUM CHLORIDE CRYS ER 20 MEQ PO TBCR
40.0000 meq | EXTENDED_RELEASE_TABLET | Freq: Three times a day (TID) | ORAL | Status: DC
  Administered 2016-06-09: 40 meq via ORAL
  Filled 2016-06-09: qty 2

## 2016-06-09 MED ORDER — PRENATAL PLUS 27-1 MG PO TABS
1.0000 | ORAL_TABLET | Freq: Every day | ORAL | Status: DC
  Administered 2016-06-10 – 2016-06-14 (×5): 1 via ORAL
  Filled 2016-06-09 (×6): qty 1

## 2016-06-09 MED ORDER — ALUM & MAG HYDROXIDE-SIMETH 200-200-20 MG/5ML PO SUSP: 30.0000 mL | ORAL | Status: DC | PRN

## 2016-06-09 MED ORDER — ONDANSETRON HCL 4 MG PO TABS: 4.0000 mg | ORAL_TABLET | Freq: Three times a day (TID) | ORAL | Status: DC | PRN

## 2016-06-09 MED ORDER — NICOTINE 21 MG/24HR TD PT24
21.0000 mg | MEDICATED_PATCH | Freq: Every day | TRANSDERMAL | Status: DC
  Administered 2016-06-09: 21 mg via TRANSDERMAL
  Filled 2016-06-09: qty 1

## 2016-06-09 MED ORDER — ACETAMINOPHEN 325 MG PO TABS: 650.0000 mg | ORAL_TABLET | ORAL | Status: DC | PRN

## 2016-06-09 NOTE — ED Notes (Signed)
AC at behavorial health states that pt made suicidal comments to them and she needs in patient.

## 2016-06-09 NOTE — BH Assessment (Signed)
Patient is to be admitted to [facility] by Dr. Lucianne Muss.  Attending Physician will be Dr. Jennet Maduro.   Patient has been assigned to room 310, by [facility] Charge Nurse Gwen.  Call report to [phone] Patient access (Abby) is aware of pt acceptance.   Pt may arrive after 1600.  Clinician informed Steffanie Rainwater of admission/bed assignment. AC states she will inform appropriate [facility] staff and request consent paperwork be faxed to [facility] unit. Clinician reminded AC that [facility] staff is to call report prior to pt transport.

## 2016-06-09 NOTE — Progress Notes (Signed)
Patient also positive for benzodiazepines

## 2016-06-09 NOTE — Plan of Care (Signed)
Problem: Education: Goal: Knowledge of [facility] Education information/materials will improve Outcome: Progressing Verbalizing  Understanding  Of information received

## 2016-06-09 NOTE — Tx Team (Signed)
Initial Treatment Plan 06/09/2016 5:46 PM [name] ZOX:[id number]    PATIENT STRESSORS: Financial difficulties Health problems Substance abuse   PATIENT STRENGTHS: Ability for insight Active sense of humor Communication skills Supportive family/friends   PATIENT IDENTIFIED PROBLEMS: Depression 06/09/16  Substance Abuse 06/09/16                   DISCHARGE CRITERIA:  Ability to meet basic life and health needs Improved stabilization in mood, thinking, and/or behavior  PRELIMINARY DISCHARGE PLAN: Outpatient therapy Return to previous living arrangement  PATIENT/FAMILY INVOLVEMENT: This treatment plan has been presented to and reviewed with the patient, Michelle Payne, and/or family member,  .  The patient and family have been given the opportunity to ask questions and make suggestions.  Crist Infante, RN 06/09/2016, 5:46 PM

## 2016-06-09 NOTE — ED Notes (Signed)
Lunch trays given to pt.  
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
## 2016-06-09 NOTE — BH Assessment (Signed)
Charge RN Dedra Skeens) provided with pt MRN. Ptpending review for possible placement with [facility].
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
## 2016-06-09 NOTE — ED Notes (Signed)
Spoke with patient about why she is here.  Pt states she does not want to kill herself.  States she is here to get  Help with depression and her meds.  Charge nurse notified.

## 2016-06-09 NOTE — ED Triage Notes (Signed)
Pt states she has been under stress lately. Pt had to move from home for family reasons. Pt having reaching out for help now. Having trtouble getting her thoughts. Keeps saying she is wanting help. Pt found out she was a couple months pregnant [redacted] weeks ago & stopped taking her medications. Having trouble cooping.

## 2016-06-09 NOTE — BH Assessment (Signed)
Michelle Payne, [facility] inquired about pt's [facility] bed assignment. Clinician informed AC that pt's chart was currently under review by unit charge RN.

## 2016-06-10 DIAGNOSIS — O99341 Other mental disorders complicating pregnancy, first trimester: Secondary | ICD-10-CM

## 2016-06-10 DIAGNOSIS — F3181 Bipolar II disorder: Secondary | ICD-10-CM

## 2016-06-10 DIAGNOSIS — Z3A10 10 weeks gestation of pregnancy: Secondary | ICD-10-CM

## 2016-06-10 DIAGNOSIS — O2341 Unspecified infection of urinary tract in pregnancy, first trimester: Secondary | ICD-10-CM

## 2016-06-10 MED ORDER — NICOTINE 14 MG/24HR TD PT24
14.0000 mg | MEDICATED_PATCH | Freq: Every day | TRANSDERMAL | Status: DC
  Administered 2016-06-10 – 2016-06-14 (×5): 14 mg via TRANSDERMAL
  Filled 2016-06-10 (×5): qty 1

## 2016-06-10 MED ORDER — BACITRACIN-NEOMYCIN-POLYMYXIN 400-5-5000 EX OINT
TOPICAL_OINTMENT | Freq: Every day | CUTANEOUS | Status: DC
  Administered 2016-06-10 – 2016-06-11 (×2): 1 via TOPICAL
  Administered 2016-06-12: 11:00:00 via TOPICAL
  Filled 2016-06-10 (×7): qty 1

## 2016-06-10 NOTE — Progress Notes (Signed)
D: Pt denies SI/HI/AVH. Pt is pleasant and cooperative. Pt stated she felt better, pt stated she felt that people didn't care about her and if she were not here they would not miss her. Pt seen on unit with peers. Pt had pleasant and bright affect this evening.   A: Pt was offered support and encouragement. Pt was given scheduled medications. Pt was encourage to attend groups. Q 15 minute checks were done for safety.   R:Pt attends groups and interacts well with peers and staff. Pt is taking medication. Pt has no complaints.Pt receptive to treatment and safety maintained on unit.

## 2016-06-10 NOTE — Progress Notes (Signed)
D: Pt denies SI/HI/AVH. Pt is pleasant and cooperative, affect is flat and sad, isolates to self. Pt appears less anxious, minimal interaction with peers and staff.  A: Pt was offered support and encouragement. Pt was given scheduled medications. Pt was encouraged to attend evening group, 15 minute checks were done for safety.  R: Pt did not attend evening group.. Pt has no complaints.Pt receptive to treatment and safety maintained on unit.

## 2016-06-10 NOTE — [facility] Suicide Risk Assessment (Signed)
[facility] INPATIENT:  Family/Significant Other Suicide Prevention Education  Suicide Prevention Education:  Patient Refusal for Family/Significant Other Suicide Prevention Education: The patient Michelle Payne has refused to provide written consent for family/significant other to be provided Family/Significant Other Suicide Prevention Education during admission and/or prior to discharge.  Physician notified.  Filippa Yarbough G. Garnette CzechSampson MSW, LCSWA 06/10/2016, 4:21 PM

## 2016-06-10 NOTE — [facility] Group Notes (Signed)
[facility] LCSW Group Therapy Note  Date/Time: 06/10/16, 0930  Type of Therapy/Topic:  Group Therapy:  Balance in Life  Participation Level:  active  Description of Group:    This group will address the concept of balance and how it feels and looks when one is unbalanced. Patients will be encouraged to process areas in their lives that are out of balance, and identify reasons for remaining unbalanced. Facilitators will guide patients utilizing problem- solving interventions to address and correct the stressor making their life unbalanced. Understanding and applying boundaries will be explored and addressed for obtaining  and maintaining a balanced life. Patients will be encouraged to explore ways to assertively make their unbalanced needs known to significant others in their lives, using other group members and facilitator for support and feedback.  Therapeutic Goals: 1. Patient will identify two or more emotions or situations they have that consume much of in their lives. 2. Patient will identify signs/triggers that life has become out of balance:  3. Patient will identify two ways to set boundaries in order to achieve balance in their lives:  4. Patient will demonstrate ability to communicate their needs through discussion and/or role plays  Summary of Patient Progress: Pt came late to group due to meeting with an MD, but participated as soon as she arrived.  She identified mental/emotional, physical, and spiritual as areas of her life that are out of balance.  Pt shared that she had just learned that she is pregnant and is still reacting to that news.  She was an active participant in the discussion about restoring balance where needed.         Therapeutic Modalities:   Cognitive Behavioral Therapy Solution-Focused Therapy Assertiveness Training  Daleen SquibbGreg Shalonda Sachse, [place]CS[place]
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
## 2016-06-10 NOTE — Consult Note (Signed)
Obstetrics & Gynecology History and Physical Note  Date of Consultation: 06/10/2016   Requesting Provider: Dr Hinton DyerPucilowski  Primary OBGYN: [facility] Primary Care Provider: Milana ObeyStephen D Knowlton  Reason for Consultation: First trimester pregancy, Substance Abuse, Bipolar  History of Present Illness: Michelle Payne is a 41 y.o. [id number] 10 weeks based on US done 06/01/16, who presents to behavioral medicine for intervention but also with the above CC. Pt stopped using birth control in January to see if she could get pregnant, and quickly did.  She has been off of Bipolar meds (Lamictal) since that time.  Recent life stress has been overwhelming for her, which she says led to episode of drug use including cocaine and cannabis.  Pt had nausea but not any longer.  Feels pregnant in abdomen, no pain or bleeding.  Prior NSVD many years ago.  Not currently on any meds other than vitamins.  ROS: A review of systems was performed and negative, except as stated in the above HPI.  OBGYN History: As per HPI. OB History    Gravida Para Term Preterm AB Living   7 3     3  3    SAB TAB Ectopic Multiple Live Births   3        3       Past Medical History: Past Medical History:  Diagnosis Date  . Anxiety   . Arthritis    knees  . Bipolar 2 disorder (HCC)   . Carpal tunnel syndrome on left 02/2016  . COPD (chronic obstructive pulmonary disease) (HCC)    denies home O2  . Depression    depression, bipolar  . Headache    unspecified; 2 x/week    Past Surgical History: Past Surgical History:  Procedure Laterality Date  . CARPAL TUNNEL RELEASE Left 02/24/2016   Procedure: LEFT CARPAL TUNNEL RELEASE;  Surgeon: Betha LoaKevin Kuzma, MD;  Location: [facility];  Service: Orthopedics;  Laterality: Left;  . TOOTH EXTRACTION      Family History:  Family History  Problem Relation Age of Onset  . Diabetes Paternal Grandmother   . Heart disease Maternal Grandmother   . Cancer Maternal  Grandfather   . Hypertension Mother   . ADD / ADHD Son    She  denies any female cancers, bleeding or blood clotting disorders.   Social History:  Social History   Social History  . Marital status: Single    Spouse name: N/A  . Number of children: N/A  . Years of education: N/A   Occupational History  . Not on file.   Social History Main Topics  . Smoking status: Current Every Day Smoker    Packs/day: 0.25    Years: 15.00  . Smokeless tobacco: Never Used  . Alcohol use No  . Drug use: Yes    Types: Cocaine, Marijuana     Comment: last used 06/09/16  . Sexual activity: Yes    Birth control/ protection: None   Other Topics Concern  . Not on file   Social History Narrative  . No narrative on file    Allergy: Allergies  Allergen Reactions  . Bee Venom Shortness Of Breath and Swelling    Current Outpatient Medications: Prescriptions Prior to Admission  Medication Sig Dispense Refill Last Dose  . albuterol (PROVENTIL HFA;VENTOLIN HFA) 108 (90 Base) MCG/ACT inhaler Inhale into the lungs every 6 (six) hours as needed for wheezing or shortness of breath.   unknown  . prenatal vitamin w/FE, FA (  PRENATAL 1 + 1) 27-1 MG TABS tablet Take 1 tablet by mouth daily at 12 noon. 30 each 11 06/08/2016 at Unknown time  . ALPRAZolam (XANAX) 1 MG tablet Take 1 tablet by mouth 3 (three) times daily.  0 Not Taking at Unknown time    Hospital Medications: Current Facility-Administered Medications  Medication Dose Route Frequency Provider Last Rate Last Dose  . acetaminophen (TYLENOL) tablet 650 mg  650 mg Oral Q6H PRN Jolanta B Pucilowska, MD      . alum & mag hydroxide-simeth (MAALOX/MYLANTA) 200-200-20 MG/5ML suspension 30 mL  30 mL Oral Q4H PRN Jolanta B Pucilowska, MD      . folic acid (FOLVITE) tablet 2 mg  2 mg Oral Daily Shari Prows, MD   2 mg at 06/10/16 0834  . magnesium hydroxide (MILK OF MAGNESIA) suspension 30 mL  30 mL Oral Daily PRN Jolanta B Pucilowska, MD      .  prenatal vitamin w/FE, FA (PRENATAL 1 + 1) 27-1 MG tablet 1 tablet  1 tablet Oral Q1200 Shari Prows, MD   1 tablet at 06/10/16 0835    Physical Exam: Temp:  [97.7 F (36.5 C)-98.4 F (36.9 C)] 98.1 F (36.7 C) (05/03 0707) Pulse Rate:  [79-103] 79 (05/03 0707) Resp:  [18] 18 (05/03 0707) BP: (100-111)/(52-67) 100/58 (05/03 0707) SpO2:  [99 %-100 %] 100 % (05/02 1653) Weight:  [194 lb (88 kg)] 194 lb (88 kg) (05/02 1652) No intake/output data recorded. Body mass index is 33.3 kg/m. Constitutional: Well nourished, well developed female in no acute distress.  HEENT: normal Abdomen: positive bowel sounds and no masses, hernias; diffusely non tender to palpation, non distended Neuro: grossly intact Psych:  Normal mood and affect.  Skin:  Warm and dry.  MS: normal gait and normal bilateral lower extremity strength/ROM/symmetry  Recent Labs Lab 06/09/16 0613  WBC 13.7*  HGB 13.3  HCT 38.6  PLT 219    Recent Labs Lab 06/09/16 0613  NA 135  K 3.1*  CL 105  CO2 20*  BUN 12  CREATININE 0.67  CALCIUM 9.2  PROT 7.4  BILITOT 0.8  ALKPHOS 88  ALT 16  AST 27  GLUCOSE 130*   Imaging:  (from 06/01/16) Ultrasound independently reviewed/interpreted by self.  Assessment: Ms. Embry is a 41 y.o. [phone] pregnant at [redacted] weeks with h/o substanc abuse as well as Bipolar.   Plan: Cont PNC as an outpatient Counseled on risks of substance use in pregnancy, both to her and to fetus Counseled as to the pros and cons of medical therapy for bipolar disorder.  May benefit from meds that have safety in pregnancy.  All meds have some risks or unknowns with pregnancy, and also bipolar swings can also have significant risks as well. PNV recommended. No further imaging or labs needed.  Annamarie Major, MD, [facility], [facility] 06/10/2016  9:50 AM Pager [phone]

## 2016-06-10 NOTE — Progress Notes (Signed)
Recreation Therapy Notes  Date: 05.03.18 Time: 1:00 pm Location: Craft Room  Group Topic: Leisure Education  Goal Area(s) Addresses:  Patient will identify activities for each letter of the alphabet. Patient will verbalize ability to integrate positive leisure into life post d/c. Patient will verbalize ability to use leisure as a coping skill.  Behavioral Response: Attentive, Interactive  Intervention: Leisure Alphabet  Activity: Patients were given a Leisure Alphabet worksheet and were instructed to write healthy leisure activities for each letter of the alphabet.  Education: LRT educated patients on what they need to participate in leisure.  Education Outcome: Acknowledges education/In group clarification offered   Clinical Observations/Feedback: Patient wrote healthy leisure activities. Patient contributed to group discussion by stating healthy leisure activities, what she needs to participate in leisure, and what makes leisure a good coping skill.  Tonimarie Gritz M, LRT/CTRS 06/10/2016 1:48 PM 

## 2016-06-10 NOTE — Progress Notes (Signed)
Recreation Therapy Notes  INPATIENT RECREATION THERAPY ASSESSMENT  Patient Details Name: Michelle Payne MRN: [id number] DOB: 1975/03/18 Today's Date: 06/10/2016  Patient Stressors: Family, Relationship, Friends, Other (Comment), Work Tries to take care of her children's problems - has a 40 yo, 40 yo, and 40 yo and a grandchild. 40 yo lives in her old house; patient lives with boyfriend in his house with his two children ([id number]) - his children scream and holler and do not listen when told to do something; lack of supportive friends; had surgery on her hands and her doctor said she cannot go back to work at this time; used to take medications for Bipolar, Depression, and Anxiety but stopped taking them because she is pregnant.  Coping Skills:   Isolate, Substance Abuse, Arguments, Avoidance, Art/Dance, Exercise, Talking, Music, Sports, Other (Comment) (Breathe deep, walk away)  Personal Challenges: Communication, Concentration, Decision-Making, Problem-Solving, Self-Esteem/Confidence, Social Interaction, Stress Management  Leisure Interests (2+):  Individual - Reading, Individual - Other (Comment) (Cooking)  Awareness of Community Resources:  Yes  Community Resources:  [employer], Other (Comment) [place]  Current Use: Yes  If no, Barriers?:    Patient Strengths:  Smile, person that she knows she is  Patient Identified Areas of Improvement:  Exercising, coping with anger and stress  Current Recreation Participation:  Going to the park, to the store and mall, walking with the kids  Patient Goal for Hospitalization:  To focus on all the things she has problems with and make them better  [place] Residence[place]  [place]  County [place] Residence:  [place]   Current [place] (including self-harm):  No  Current HI:  No  Consent to Intern Participation: N/A   Jacquelynn CreeGreene,Kadee Philyaw M, LRT/CTRS 06/10/2016, 4:15 PM

## 2016-06-10 NOTE — Progress Notes (Signed)
Pt up on unit today appropriately interacting with staff/peers. Socializes in dayroom. Attends groups. Appears sad, depressed, but brightens on approach. Denies SI/HI/AVH, pain. Requests neosporin for small cuts to her fingers, order obtained. Reports good sleep last night without the help of sleep medication, fair appetite, normal energy, poor concentration. Rates depression 7/10, hopelessness 0/10, anxiety 5/10 (low 0-10 high). Withdrawal symptoms not reported or observed. Goal today is "to have positive happy thoughts, not stress over things in life I can't change" by "pray and keep calmness in my soul." Pt is pleasant and cooperative. Pt is medication compliant. Support and encouragement provided. Medications administered as ordered with education. Safety maintained with every 15 minute checks. Will continue to monitor.
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
## 2016-06-10 NOTE — Plan of Care (Signed)
Problem: Activity: Goal: Interest or engagement in leisure activities will improve Outcome: Progressing Pt appropriately interacts with staff/peers, up on unit, attends group, socializes in dayroom.

## 2016-06-10 NOTE — [facility] Group Notes (Signed)
Goals Group Date/Time: 06/10/2016 9:00 AM Type of Therapy and Topic: Group Therapy: Goals Group: SMART Goals   Participation Level: Moderate  Description of Group:    The purpose of a daily goals group is to assist and guide patients in setting recovery/wellness-related goals. The objective is to set goals as they relate to the crisis in which they were admitted. Patients will be using SMART goal modalities to set measurable goals. Characteristics of realistic goals will be discussed and patients will be assisted in setting and processing how one will reach their goal. Facilitator will also assist patients in applying interventions and coping skills learned in psycho-education groups to the SMART goal and process how one will achieve defined goal.   Therapeutic Goals:   -Patients will develop and document one goal related to or their crisis in which brought them into treatment.  -Patients will be guided by LCSW using SMART goal setting modality in how to set a measurable, attainable, realistic and time sensitive goal.  -Patients will process barriers in reaching goal.  -Patients will process interventions in how to overcome and successful in reaching goal.   Patient's Goal:Pt goal for today was to work on calming/relaxing her mind, particularly with the art/mandalas and using breathing.   Therapeutic Modalities:  Motivational Interviewing  Research officer, political partyCognitive Behavioral Therapy  Crisis Intervention Model  SMART goals setting   Daleen SquibbGreg Marqueta Pulley, [place]

## 2016-06-10 NOTE — [facility] Suicide Risk Assessment (Signed)
[facility]   Nursing information obtained from:  Patient Demographic factors:  Caucasian, Unemployed (Living  with boyfriend , pregnant) Current Mental Status:  NA Loss Factors:  NA Historical Factors:  NA Risk Reduction Factors:  Pregnancy, Sense of responsibility to family  Total Time spent with patient: 1 hour Principal Problem: Bipolar 2 disorder, major depressive episode (HCC) Diagnosis:   Patient Active Problem List   Diagnosis Date Noted  . Bipolar 2 disorder, major depressive episode (HCC) [F31.81] 06/09/2016  . Cocaine use disorder, moderate, dependence (HCC) [F14.20] 06/09/2016  . Cannabis use disorder, moderate, dependence (HCC) [F12.20] 06/09/2016  . UTI (urinary tract infection) [N39.0] 06/09/2016  . Substance induced mood disorder (HCC) [F19.94] 06/09/2016  . Tobacco use disorder [F17.200] 05/25/2016  . Antepartum multigravida of advanced maternal age 71[O09.529] 05/25/2016  . Pregnancy [Z34.90] 05/25/2016  . Positive pregnancy test [Z32.01] 05/25/2016  . Radicular pain in right arm [M79.2] 08/08/2012  . Acute back pain [M54.9] 07/11/2012  . Patellar contusion [S80.00XA] 07/11/2012   Subjective Data: suicidal ideation.  Continued Clinical Symptoms:  Alcohol Use Disorder Identification Test Final Score (AUDIT): 0 The "Alcohol Use Disorders Identification Test", Guidelines for Use in Primary Care, Second Edition.  World Science writerHealth Organization [facility](WHO). Score between 0-7:  no or low risk or alcohol related problems. Score between 8-15:  moderate risk of alcohol related problems. Score between 16-19:  high risk of alcohol related problems. Score 20 or above:  warrants further diagnostic evaluation for alcohol dependence and treatment.   CLINICAL FACTORS:   Bipolar Disorder:   Bipolar II Depressive phase Alcohol/Substance Abuse/Dependencies   Musculoskeletal: Strength & Muscle Tone: within normal limits Gait & Station: normal Patient leans:  N/A  Psychiatric Specialty Exam: Physical Exam  Nursing note and vitals reviewed. Psychiatric: Her speech is normal and behavior is normal. Thought content normal. Her mood appears anxious. Cognition and memory are normal. She expresses impulsivity. She exhibits a depressed mood.    Review of Systems  Psychiatric/Behavioral: Positive for depression. The patient is nervous/anxious.   All other systems reviewed and are negative.   Blood pressure (!) 100/58, pulse 79, temperature 98.1 F (36.7 C), temperature source Oral, resp. rate 18, height 5\' 4"  (1.626 m), weight 88 kg (194 lb), SpO2 100 %.Body mass index is 33.3 kg/m.  General Appearance: Casual  Eye Contact:  Good  Speech:  Clear and Coherent  Volume:  Normal  Mood:  Anxious  Affect:  Congruent  Thought Process:  Goal Directed and Descriptions of Associations: Intact  Orientation:  Full (Time, Place, and Person)  Thought Content:  WDL  Suicidal Thoughts:  No  Homicidal Thoughts:  No  Memory:  Immediate;   Fair Recent;   Fair Remote;   Fair  Judgement:  Fair  Insight:  Fair  Psychomotor Activity:  Psychomotor Retardation  Concentration:  Concentration: Fair and Attention Span: Fair  Recall:  F[employer]Fair  Fund of Knowledge:  Fair  Language:  Fair  Akathisia:  No  Handed:  Right  AIMS (if indicated):     Assets:  Communication Skills Desire for Improvement Financial Resources/Insurance Housing Intimacy Physical Health Resilience Social Support Transportation  ADL's:  Intact  Cognition:  WNL  Sleep:  Number of Hours: 7.75      COGNITIVE FEATURES THAT CONTRIBUTE TO RISK:  None    SUICIDE RISK:   Mild:  Suicidal ideation of limited frequency, intensity, duration, and specificity.  There are no identifiable plans, no associated intent, mild dysphoria and  related symptoms, good self-control (both objective and subjective assessment), few other risk factors, and identifiable protective factors, including available and  accessible social support.  PLAN OF CARE: Hospital admission, medication management, substance abuse counseling, discharge planning.  Ms. Tegtmeyer is a 41 year old female with a history of bipolar disorder admitted for suicidal ideation in the context of medication discontinuation, pregnancy, and substance use.  1. Suicidal ideation. The patient is able to contract for safety in the hospital.  2. Mood. She has been maintained on a combination of Lamictal, Celexa, and Xanax. She discontinued medication when pregnant. She is not interested in restarting medicines at present.  3. Substance abuse. The patient reports 13 years of sobriety with recent relapse on cocaine and marijuana. She denies drinking. She is not interested in residential substance abuse treatment program participation.  4. Pregnancy. She established care with a local provider. OB/GYN consult is greatly appreciated. She is taking prenatal vitamins.  5. Disposition. She will be discharged to home with family. She will follow up with [facility] in [place].    I certify that inpatient services furnished can reasonably be expected to improve the patient's condition.   Kristine Linea, MD 06/10/2016, 12:12 PM

## 2016-06-10 NOTE — Plan of Care (Signed)
Problem: Coping: Goal: Ability to verbalize feelings will improve Outcome: Progressing Patient verbalized feelings to staff.    

## 2016-06-11 NOTE — Plan of Care (Signed)
Problem: Safety: Goal: Ability to remain free from injury will improve Outcome: Progressing No injury. Safety precautions maintained   

## 2016-06-11 NOTE — Plan of Care (Signed)
Problem: Coping: Goal: Ability to cope will improve Outcome: Progressing Remained cooperative, interacting with peers appropriately

## 2016-06-11 NOTE — Progress Notes (Signed)
Patient rated her depression 8/10 and anxiety 5/10.Patients affect is brightens on approach.Verbalized that her stress with step children and boy friend always says "there is nothing wrong with you."Appropriate with staff & peers.Compliant with medications.Attended groups.Verbalized that her energy level is low,appetite good.Support & encouragement given.

## 2016-06-11 NOTE — Progress Notes (Signed)
Patient pleasant upon approach.  Denies pain.  Denies SI/HI.  Denies AVH.  Patient reported light pinkish spotting earlier this morning but nothing since then.  Denies cramping/pain.  Reported to Dr Jennet MaduroPucilowska.  Reported last BM 06/10/16.  Voices no complaints or concerns.  Social interactions with peers and staff

## 2016-06-11 NOTE — [facility] Group Notes (Signed)
[facility] Group Notes:  (Nursing/MHT/Case Management/Adjunct)  Date:  06/11/2016  Time:  11:29 PM  Type of Therapy:  Group Therapy  Participation Level:  Active  Participation Quality:  Appropriate  Affect:  Appropriate  Cognitive:  Appropriate  Insight:  Appropriate  Engagement in Group:  Engaged  Modes of Intervention:  Discussion  Summary of Progress/Problems:  Michelle [name] 06/11/2016, 11:29 PM

## 2016-06-11 NOTE — Progress Notes (Signed)
[facility] MD Progress Note  06/11/2016 12:29 PM Sabra Sessler  MRN:  [id number]  Subjective:  Ms. Benavidez is a 41 year old pregnant female with history of bipolar disorder admitted for depression and suicidal ideation in the context of severe social stressors. She chooses not to take medications.  06/11/2016. Ms. Droge met with treatment team today. She reports some improvement after seeing her family yesterday. The visit went well. She feels more support. She slept well last night. She still is not interested in taking any psychotropic medications while pregnant. She still feels depressed and unable to contract for safety in the community. There are no somatic complaints. Good program participation.  Per nursing: D: Pt denies SI/HI/AVH. Pt is pleasant and cooperative. Pt stated she felt better, pt stated she felt that people didn't care about her and if she were not here they would not miss her. Pt seen on unit with peers. Pt had pleasant and bright affect this evening.   A: Pt was offered support and encouragement. Pt was given scheduled medications. Pt was encourage to attend groups. Q 15 minute checks were done for safety.   R:Pt attends groups and interacts well with peers and staff. Pt is taking medication. Pt has no complaints.Pt receptive to treatment and safety maintained on unit.  Principal Problem: Bipolar 2 disorder, major depressive episode ([place]) Diagnosis:   Patient Active Problem List   Diagnosis Date Noted  . Bipolar 2 disorder, major depressive episode ([place]) [F31.81] 06/09/2016  . Cocaine use disorder, moderate, dependence ([place]) [F14.20] 06/09/2016  . Cannabis use disorder, moderate, dependence ([place]) [F12.20] 06/09/2016  . UTI (urinary tract infection) [N39.0] 06/09/2016  . Substance induced mood disorder ([place]) [F19.94] 06/09/2016  . Tobacco use disorder [F17.200] 05/25/2016  . Antepartum multigravida of advanced maternal age [O5.529] 05/25/2016  . Pregnancy [Z34.90] 05/25/2016  .  Positive pregnancy test [Z32.01] 05/25/2016  . Radicular pain in right arm [M79.2] 08/08/2012  . Acute back pain [M54.9] 07/11/2012  . Patellar contusion [S80.00XA] 07/11/2012   Total Time spent with patient: 30 minutes  Past Psychiatric History: Bipolar disorder and substance abuse.  Past Medical History:  Past Medical History:  Diagnosis Date  . Anxiety   . Arthritis    knees  . Bipolar 2 disorder ([place])   . Carpal tunnel syndrome on left 02/2016  . COPD (chronic obstructive pulmonary disease) ([place])    denies home O2  . Depression    depression, bipolar  . Headache    unspecified; 2 x/week    Past Surgical History:  Procedure Laterality Date  . CARPAL TUNNEL RELEASE Left 02/24/2016   Procedure: LEFT CARPAL TUNNEL RELEASE;  Surgeon: Leanora Cover, MD;  Location: [facility];  Service: Orthopedics;  Laterality: Left;  . TOOTH EXTRACTION     Family History:  Family History  Problem Relation Age of Onset  . Diabetes Paternal Grandmother   . Heart disease Maternal Grandmother   . Cancer Maternal Grandfather   . Hypertension Mother   . ADD / ADHD Son    Family Psychiatric  History: See H&P. Social History:  History  Alcohol Use No     History  Drug Use  . Types: Cocaine, Marijuana    Comment: last used 06/09/16    Social History   Social History  . Marital status: Single    Spouse name: N/A  . Number of children: N/A  . Years of education: N/A   Social History Main Topics  . Smoking status: Current Every Day  Smoker    Packs/day: 0.25    Years: 15.00  . Smokeless tobacco: Never Used  . Alcohol use No  . Drug use: Yes    Types: Cocaine, Marijuana     Comment: last used 06/09/16  . Sexual activity: Yes    Birth control/ protection: None   Other Topics Concern  . None   Social History Narrative  . None   Additional Social History:                         Sleep: Fair  Appetite:  Fair  Current Medications: Current  Facility-Administered Medications  Medication Dose Route Frequency Provider Last Rate Last Dose  . acetaminophen (TYLENOL) tablet 650 mg  650 mg Oral Q6H PRN Lashawnda Hancox B Brogan England, MD      . alum & mag hydroxide-simeth (MAALOX/MYLANTA) 200-200-20 MG/5ML suspension 30 mL  30 mL Oral Q4H PRN Sherhonda Gaspar B Raliyah Montella, MD      . folic acid (FOLVITE) tablet 2 mg  2 mg Oral Daily Clovis Fredrickson, MD   2 mg at 06/11/16 0839  . magnesium hydroxide (MILK OF MAGNESIA) suspension 30 mL  30 mL Oral Daily PRN Saqib Cazarez B Kenyatta Gloeckner, MD      . neomycin-bacitracin-polymyxin (NEOSPORIN) ointment   Topical Daily Clovis Fredrickson, MD   1 application at 11/15/10 0839  . nicotine (NICODERM CQ - dosed in mg/24 hours) patch 14 mg  14 mg Transdermal Daily Clovis Fredrickson, MD   14 mg at 06/11/16 0839  . prenatal vitamin w/FE, FA (PRENATAL 1 + 1) 27-1 MG tablet 1 tablet  1 tablet Oral Q1200 Lizandra Zakrzewski B Clerence Gubser, MD   1 tablet at 06/11/16 1201    Lab Results:  Results for orders placed or performed during the hospital encounter of 06/09/16 (from the past 48 hour(s))  hCG, quantitative, pregnancy     Status: Abnormal   Collection Time: 06/09/16  5:01 PM  Result Value Ref Range   hCG, Beta Chain, Quant, S 74,910 (H) <5 mIU/mL    Comment:          GEST. AGE      CONC.  (mIU/mL)   <=1 WEEK        5 - 50     2 WEEKS       50 - 500     3 WEEKS       100 - 10,000     4 WEEKS     1,000 - 30,000     5 WEEKS     3,500 - 115,000   6-8 WEEKS     12,000 - 270,000    12 WEEKS     15,000 - 220,000        FEMALE AND NON-PREGNANT FEMALE:     LESS THAN 5 mIU/mL     Blood Alcohol level:  Lab Results  Component Value Date   ETH <5 19/75/8832    Metabolic Disorder Labs: No results found for: HGBA1C, MPG No results found for: PROLACTIN No results found for: CHOL, TRIG, HDL, CHOLHDL, VLDL, LDLCALC  Physical Findings: AIMS: Facial and Oral Movements Muscles of Facial Expression: None, normal Lips and Perioral Area:  None, normal Jaw: None, normal Tongue: None, normal,Extremity Movements Upper (arms, wrists, hands, fingers): None, normal Lower (legs, knees, ankles, toes): None, normal, Trunk Movements Neck, shoulders, hips: None, normal, Overall Severity Severity of abnormal movements (highest score from questions above): None, normal Incapacitation due to abnormal  movements: None, normal Patient's awareness of abnormal movements (rate only patient's report): No Awareness, Dental Status Current problems with teeth and/or dentures?: No Does patient usually wear dentures?: No  CIWA:  CIWA-Ar Total: 0 COWS:     Musculoskeletal: Strength & Muscle Tone: within normal limits Gait & Station: normal Patient leans: N/A  Psychiatric Specialty Exam: Physical Exam  Nursing note and vitals reviewed. Psychiatric: Her speech is normal and behavior is normal. Thought content normal. Cognition and memory are normal. She expresses impulsivity. She exhibits a depressed mood.    Review of Systems  Psychiatric/Behavioral: Positive for substance abuse.  All other systems reviewed and are negative.   Blood pressure (!) 106/58, pulse 74, temperature 98.5 F (36.9 C), temperature source Oral, resp. rate 18, height 5' 4"  (1.626 m), weight 88 kg (194 lb), SpO2 100 %.Body mass index is 33.3 kg/m.  General Appearance: Casual  Eye Contact:  Good  Speech:  Clear and Coherent  Volume:  Normal  Mood:  Anxious and Depressed  Affect:  Appropriate  Thought Process:  Goal Directed and Descriptions of Associations: Intact  Orientation:  Full (Time, Place, and Person)  Thought Content:  WDL  Suicidal Thoughts:  No  Homicidal Thoughts:  No  Memory:  Immediate;   Fair Recent;   Fair Remote;   Fair  Judgement:  Impaired  Insight:  Shallow  Psychomotor Activity:  Psychomotor Retardation  Concentration:  Concentration: Fair and Attention Span: Fair  Recall:  [employer] of Knowledge:  Fair  Language:  Fair  Akathisia:  No   Handed:  Right  AIMS (if indicated):     Assets:  Communication Skills Desire for Improvement Financial Resources/Insurance Housing Intimacy Physical Health Resilience Social Support Transportation  ADL's:  Intact  Cognition:  WNL  Sleep:  Number of Hours: 6.3     Treatment Plan Summary: Daily contact with patient to assess and evaluate symptoms and progress in treatment and Medication management   Ms. Dufrane is a 41 year old female with a history of bipolar disorder admitted for suicidal ideation in the context of medication discontinuation, pregnancy, and substance use.  1. Suicidal ideation. The patient is able to contract for safety in the hospital.  2. Mood. She has been maintained on a combination of Lamictal, Celexa, and Xanax. She discontinued medication when pregnant. She is not interested in restarting medicines at present.  3. Substance abuse. The patient reports 13 years of sobriety with recent relapse on cocaine and marijuana. She denies drinking. She is not interested in residential substance abuse treatment program participation.  4. Pregnancy. She established care with a local provider. OB/GYN consult is greatly appreciated. She is taking prenatal vitamins.  5. Disposition. She will be discharged to home with family. She will follow up with [facility] in [place].    Orson Slick, MD 06/11/2016, 12:29 PM

## 2016-06-11 NOTE — [facility] Group Notes (Signed)
[facility] LCSW Group Therapy  06/11/2016 11:21 AM  Type of Therapy:  Group Therapy  Participation Level:  Active  Participation Quality:  Attentive  Affect:  Appropriate  Cognitive:  Alert  Insight:  Developing/Improving  Engagement in Therapy:  Developing/Improving  Modes of Intervention:  Activity, Discussion, Education, Problem-solving, Reality Testing, Socialization and Support  Summary of Progress/Problems: Feelings around Relapse. Group members discussed the meaning of relapse and shared personal stories of relapse, how it affected them and others, and how they perceived themselves during this time. Group members were encouraged to identify triggers, warning signs and coping skills used when facing the possibility of relapse. Social supports were discussed and explored in detail. Patients also discussed facing disappointment and how that can trigger someone to relapse.  Cristabel Bicknell G. Garnette CzechSampson MSW, LCSWA 06/11/2016, 11:22 AM

## 2016-06-11 NOTE — Plan of Care (Signed)
Problem: [facility] Participation in Recreation Therapeutic Interventions Goal: STG-Patient will demonstrate improved self esteem by identif STG: Self-Esteem - Within 4 treatment sessions, patient will verbalize at least 5 positive affirmation statements in each of 2 treatment sessions to increase self-esteem.  Outcome: Progressing Treatment Session 1; Completed 1 out of 2: At approximately 2:00 pm, LRT met with patient in patient room. Patient verbalized 5 positive affirmation statements. Patient reported it felt "good". LRT encouraged patient to continue saying positive affirmation statements.  Leonette Monarch, LRT/CTRS 05.04.18 2:25 pm Goal: STG-Other Recreation Therapy Goal (Specify) STG: Stress Management - Within 4 treatment sessions, patient will verbalize understanding of the stress management techniques in each of 2 treatment sessions to increase stress management skills.  Outcome: Progressing Treatment Session 1; Completed 1 out of 2: At approximately 2:00 pm, LRT met with patient in patient room. LRT educated and provided patient with handouts on stress management techniques. Patient verbalized understanding. LRT encouraged patient to read over and practice the stress management techniques.  Leonette Monarch, LRT/CTRS 05.04.18 2:26 pm

## 2016-06-11 NOTE — Progress Notes (Addendum)
2300: Patient stayed in the milieu, basically in the dayroom with staff and peers. Pleasant and cooperative. Alert and oriented and denying suicidal/homicidal thoughts. Denying hallucinations. Avoided talking about her mental health issues and focused on pregnancy, happy and and proud. Patient maintained appropriate behavior and interacted with peers appropriately. Currently in bed sleeping. No sign of discomfort. Safety precautions maintained.  0600: patient slept all night. Had no concern. Safety precautions maintained.

## 2016-06-11 NOTE — Tx Team (Addendum)
Interdisciplinary Treatment and Diagnostic Plan Update  06/11/2016 Time of Session: 10:30 AM Michelle Moralesicole Amrhein MRN: [id number]  Principal Diagnosis: Bipolar 2 disorder, major depressive episode (HCC)  Secondary Diagnoses: Principal Problem:   Bipolar 2 disorder, major depressive episode (HCC) Active Problems:   Tobacco use disorder   Pregnancy   Cocaine use disorder, moderate, dependence (HCC)   Cannabis use disorder, moderate, dependence (HCC)   UTI (urinary tract infection)   Substance induced mood disorder (HCC)   Current Medications:  Current Facility-Administered Medications  Medication Dose Route Frequency Provider Last Rate Last Dose  . acetaminophen (TYLENOL) tablet 650 mg  650 mg Oral Q6H PRN Jolanta B Pucilowska, MD      . alum & mag hydroxide-simeth (MAALOX/MYLANTA) 200-200-20 MG/5ML suspension 30 mL  30 mL Oral Q4H PRN Jolanta B Pucilowska, MD      . folic acid (FOLVITE) tablet 2 mg  2 mg Oral Daily Shari ProwsJolanta B Pucilowska, MD   2 mg at 06/11/16 0839  . magnesium hydroxide (MILK OF MAGNESIA) suspension 30 mL  30 mL Oral Daily PRN Jolanta B Pucilowska, MD      . neomycin-bacitracin-polymyxin (NEOSPORIN) ointment   Topical Daily Shari ProwsJolanta B Pucilowska, MD   1 application at 06/11/16 0839  . nicotine (NICODERM CQ - dosed in mg/24 hours) patch 14 mg  14 mg Transdermal Daily Shari ProwsJolanta B Pucilowska, MD   14 mg at 06/11/16 0839  . prenatal vitamin w/FE, FA (PRENATAL 1 + 1) 27-1 MG tablet 1 tablet  1 tablet Oral Q1200 Shari ProwsJolanta B Pucilowska, MD   1 tablet at 06/10/16 0835   PTA Medications: Prescriptions Prior to Admission  Medication Sig Dispense Refill Last Dose  . albuterol (PROVENTIL HFA;VENTOLIN HFA) 108 (90 Base) MCG/ACT inhaler Inhale into the lungs every 6 (six) hours as needed for wheezing or shortness of breath.   unknown  . prenatal vitamin w/FE, FA (PRENATAL 1 + 1) 27-1 MG TABS tablet Take 1 tablet by mouth daily at 12 noon. 30 each 11 06/08/2016 at Unknown time  . ALPRAZolam (XANAX)  1 MG tablet Take 1 tablet by mouth 3 (three) times daily.  0 Not Taking at Unknown time    Patient Stressors: Financial difficulties Health problems Substance abuse  Patient Strengths: Ability for insight Active sense of humor Communication skills Supportive family/friends  Treatment Modalities: Medication Management, Group therapy, Case management,  1 to 1 session with clinician, Psychoeducation, Recreational therapy.   Physician Treatment Plan for Primary Diagnosis: Bipolar 2 disorder, major depressive episode (HCC) Long Term Goal(s): Improvement in symptoms so as ready for discharge Improvement in symptoms so as ready for discharge   Short Term Goals: Ability to identify changes in lifestyle to reduce recurrence of condition will improve Ability to verbalize feelings will improve Ability to disclose and discuss suicidal ideas Ability to demonstrate self-control will improve Ability to identify and develop effective coping behaviors will improve Compliance with prescribed medications will improve Ability to identify triggers associated with substance abuse/mental health issues will improve Ability to identify changes in lifestyle to reduce recurrence of condition will improve Ability to demonstrate self-control will improve Ability to identify triggers associated with substance abuse/mental health issues will improve  Medication Management: Evaluate patient's response, side effects, and tolerance of medication regimen.  Therapeutic Interventions: 1 to 1 sessions, Unit Group sessions and Medication administration.  Evaluation of Outcomes: Progressing  Physician Treatment Plan for Secondary Diagnosis: Principal Problem:   Bipolar 2 disorder, major depressive episode (HCC) Active Problems:   Tobacco  use disorder   Pregnancy   Cocaine use disorder, moderate, dependence (HCC)   Cannabis use disorder, moderate, dependence (HCC)   UTI (urinary tract infection)   Substance  induced mood disorder (HCC)  Long Term Goal(s): Improvement in symptoms so as ready for discharge Improvement in symptoms so as ready for discharge   Short Term Goals: Ability to identify changes in lifestyle to reduce recurrence of condition will improve Ability to verbalize feelings will improve Ability to disclose and discuss suicidal ideas Ability to demonstrate self-control will improve Ability to identify and develop effective coping behaviors will improve Compliance with prescribed medications will improve Ability to identify triggers associated with substance abuse/mental health issues will improve Ability to identify changes in lifestyle to reduce recurrence of condition will improve Ability to demonstrate self-control will improve Ability to identify triggers associated with substance abuse/mental health issues will improve     Medication Management: Evaluate patient's response, side effects, and tolerance of medication regimen.  Therapeutic Interventions: 1 to 1 sessions, Unit Group sessions and Medication administration.  Evaluation of Outcomes: Progressing   RN Treatment Plan for Primary Diagnosis: Bipolar 2 disorder, major depressive episode (HCC) Long Term Goal(s): Knowledge of disease and therapeutic regimen to maintain health will improve  Short Term Goals: Ability to demonstrate self-control and Ability to identify and develop effective coping behaviors will improve  Medication Management: RN will administer medications as ordered by provider, will assess and evaluate patient's response and provide education to patient for prescribed medication. RN will report any adverse and/or side effects to prescribing provider.  Therapeutic Interventions: 1 on 1 counseling sessions, Psychoeducation, Medication administration, Evaluate responses to treatment, Monitor vital signs and CBGs as ordered, Perform/monitor CIWA, COWS, AIMS and Fall Risk screenings as ordered, Perform wound  care treatments as ordered.  Evaluation of Outcomes: Progressing   LCSW Treatment Plan for Primary Diagnosis: Bipolar 2 disorder, major depressive episode (HCC) Long Term Goal(s): Safe transition to appropriate next level of care at discharge, Engage patient in therapeutic group addressing interpersonal concerns.  Short Term Goals: Engage patient in aftercare planning with referrals and resources and Increase social support  Therapeutic Interventions: Assess for all discharge needs, 1 to 1 time with Social worker, Explore available resources and support systems, Assess for adequacy in community support network, Educate family and significant other(s) on suicide prevention, Complete Psychosocial Assessment, Interpersonal group therapy.  Evaluation of Outcomes: Progressing    Recreational Therapy Treatment Plan for Primary Diagnosis: Bipolar 2 disorder, major depressive episode (HCC) Long Term Goal(s): Patient will participate in recreation therapy treatment in at least 2 group sessions without prompting from LRT  Short Term Goals: Increase self-esteem, Increase stress management skills  Treatment Modalities: Group Therapy and Individual Treatment Sessions  Therapeutic Interventions: Psychoeducation  Evaluation of Outcomes: Progressing   Progress in Treatment: Attending groups: Yes. Participating in groups: Yes. Taking medication as prescribed: Yes. Toleration medication: Yes. Family/Significant other contact made: No, will contact:  Patient refused family contact. Patient understands diagnosis: Yes. Discussing patient identified problems/goals with staff: Yes. Medical problems stabilized or resolved: Yes. Denies suicidal/homicidal ideation: Yes. Issues/concerns per patient self-inventory: No.   New problem(s) identified: No, Describe:  None identified.  New Short Term/Long Term Goal(s): Patient's goal is to get better without medications and implement coping  skills.  Discharge Plan or Barriers: CSW assessing proper aftercare plans.  Reason for Continuation of Hospitalization: Depression  Estimated Length of Stay: 2-3 days   Attendees: Patient: Michelle Payne  06/11/2016 11:39 AM  Physician: Dr. Kristine Linea, MD  06/11/2016 11:39 AM  Nursing: Leonia Reader, BSN, RN  06/11/2016 11:39 AM  RN Care Manager: 06/11/2016 11:39 AM  Social Worker: Hampton Abbot, MSW, LCSW-A 06/11/2016 11:39 AM  Recreational Therapist: Princella Ion, LRT/CTRS  06/11/2016 11:39 AM  Other:  06/11/2016 11:39 AM  Other:  06/11/2016 11:39 AM  Other: 06/11/2016 11:39 AM    Scribe for Treatment Team: Lynden Oxford, LCSWA 06/11/2016 11:39 AM

## 2016-06-11 NOTE — Progress Notes (Signed)
Recreation Therapy Notes  Date: 05.04.18 Time: 1:00 pm Location: Craft Room  Group Topic: Social Skills  Goal Area(s) Addresses:  Patient will effectively work with peer towards shared goal. Patient will identify skills used to make activity successful Patient will identify benefit of using group skills effectively post d/c.  Behavioral Response: Attentive, Interactive  Intervention: [employer] Tower  Activity: Patients were split into groups and given 15 pipe cleaners. Patients were instructed to build a free standing tower. Patients were given 2 minutes to strategize. After building for about 3 minutes, patients were instructed to put their dominant hand behind their backs. After another 2 minutes of building, patients were instructed to stop talking to each other.  Education: LRT educated patients on healthy support systems.  Education Outcome: Acknowledges education/In group clarification offered  Clinical Observations/Feedback: Patient worked with peers to build tower. Patient used effective communication, problem solving, and teamwork skills. Patient contributed to group discussion by stating what skills she used in group, and how she felt after she used the skills in group.  Michelle Payne,Michelle Payne, LRT/CTRS 06/11/2016 2:20 PM

## 2016-06-11 NOTE — Plan of Care (Signed)
Problem: Activity: Goal: Interest or engagement in leisure activities will improve Outcome: Progressing Involved in unit activities

## 2016-06-12 NOTE — [facility] Group Notes (Signed)
[facility] Group Notes:  (Nursing/MHT/Case Management/Adjunct)  Date:  06/12/2016  Time:  11:28 PM  Type of Therapy:  Group Therapy  Participation Level:  Active  Participation Quality:  Appropriate  Affect:  Appropriate  Cognitive:  Appropriate  Insight:  Appropriate  Engagement in Group:  Engaged  Modes of Intervention:  Support  Summary of Progress/Problems:  Mayra NeerJackie L Leauna Sharber 06/12/2016, 11:28 PM

## 2016-06-12 NOTE — [facility] Group Notes (Signed)
[facility] LCSW Group Therapy  06/12/2016 2:14 PM  Type of Therapy:  Group Therapy  Participation Level:  Active  Participation Quality:  Attentive and Sharing  Affect:  Appropriate  Cognitive:  Alert  Insight:  Developing/Improving  Engagement in Therapy:  Developing/Improving  Modes of Intervention:  Activity, Discussion, Education, Problem-solving, Reality Testing, Socialization and Support  Summary of Progress/Problems: Communications: Patients identify how individuals communicate with one another appropriately and inappropriately. Patients will be guided to discuss their thoughts, feelings, and behaviors related to barriers when communicating. The group will process together ways to execute positive and appropriate communications.   Michelle Payne G. Garnette CzechSampson MSW, LCSWA 06/12/2016, 2:15 PM

## 2016-06-12 NOTE — Progress Notes (Signed)
D: Patient was observed spending time with peers in the dayroom and also talking on the phone. She was pleasant on contact. She denies SI/HI/AV?H and contracts for safety.  A: Patient is assertive in conversation.  Mood is calm and affect is pleasant. R: Continue safety checks every 15 minutes.  Provide support and education.

## 2016-06-12 NOTE — Plan of Care (Signed)
Problem: Self-Concept: Goal: Level of anxiety will decrease Outcome: Progressing Appears less anxious, depressed. Socializes in dayroom most of the day appropriately with peers.

## 2016-06-12 NOTE — Progress Notes (Signed)
Pt observed in dayroom socializing with peers today. Attended group.Appropriately interacts with staff/peers. Bright, laughing, smiling with peers. Pleasant, calm, cooperative. Denies SI/HI/AVH, pain, vaginal discharge. Overheard talking to peer about "being ready to discharge, but not wanting to go home." No complaints voiced. Pt is medication compliant. Support and encouragement provided. Medications administered as ordered with education. Safety maintained with every 15 minute checks. Will continue to monitor.

## 2016-06-12 NOTE — Plan of Care (Signed)
Problem: Coping: Goal: Ability to verbalize feelings will improve Outcome: Progressing Patient is assertive on contact and engages with staff.

## 2016-06-13 MED ORDER — FOLIC ACID 1 MG PO TABS: 2.0000 mg | ORAL_TABLET | Freq: Every day | ORAL | 1 refills | Status: DC

## 2016-06-13 NOTE — Progress Notes (Signed)
Per self inventory, pt writes, "I have learned a lot here and am proud of myself for making this step." Voices readiness for discharge tomorrow. Voices her stressors at home is caring for 3 pre-teen children and all their extra curricular activities and now has a baby on the way. Reports good sleep last night, good appetite, normal energy, good concentration. Rates depression 0/10, hopelessness 0/10, anxiety 0/10 (low 0-10 high). Goal today is "seeing my children" by "pray and try to call again." Appears sad, but brightens on approach. Denies SI/HI/AVH, pain. Support and encouragement provided. Support and encouragement provided. Medications administered as ordered with eduction. Safety maintained with every 15 minute checks. Will continue to monitor.

## 2016-06-13 NOTE — Progress Notes (Signed)
[facility] MD Progress Note  06/13/2016 12:26 PM Merilee Wible  MRN:  [id number]  Subjective:  Ms. Henkes is a 41 year old pregnant female with history of bipolar disorder admitted for depression and suicidal ideation in the context of severe social stressors. She chooses not to take medications.  06/11/2016. Ms. Spelman met with treatment team today. She reports some improvement after seeing her family yesterday. The visit went well. She feels more support. She slept well last night. She still is not interested in taking any psychotropic medications while pregnant. She still feels depressed and unable to contract for safety in the community. There are no somatic complaints. Good program participation.  Patient seen on Saturday the fifth. She is neatly dressed and groomed. Affect is calm smiling pleasant. Thoughts appear to be clear. Says she still does not want to take any medicine. She fills like her depression is under better control. No suicidal ideation. Good cooperation and interaction with others.  Patient seen Sunday the sixth. No new complaints. Presentation the same as yesterday. Smiling and relaxed. Good health no complaints. Interactive with groups.  Per nursing: D: Pt denies SI/HI/AVH. Pt is pleasant and cooperative. Pt stated she felt better, pt stated she felt that people didn't care about her and if she were not here they would not miss her. Pt seen on unit with peers. Pt had pleasant and bright affect this evening.   A: Pt was offered support and encouragement. Pt was given scheduled medications. Pt was encourage to attend groups. Q 15 minute checks were done for safety.   R:Pt attends groups and interacts well with peers and staff. Pt is taking medication. Pt has no complaints.Pt receptive to treatment and safety maintained on unit.  Principal Problem: Bipolar 2 disorder, major depressive episode ([place]) Diagnosis:   Patient Active Problem List   Diagnosis Date Noted  . Bipolar 2 disorder,  major depressive episode ([place]) [F31.81] 06/09/2016  . Cocaine use disorder, moderate, dependence ([place]) [F14.20] 06/09/2016  . Cannabis use disorder, moderate, dependence ([place]) [F12.20] 06/09/2016  . UTI (urinary tract infection) [N39.0] 06/09/2016  . Substance induced mood disorder ([place]) [F19.94] 06/09/2016  . Tobacco use disorder [F17.200] 05/25/2016  . Antepartum multigravida of advanced maternal age [O32.529] 05/25/2016  . Pregnancy [Z34.90] 05/25/2016  . Positive pregnancy test [Z32.01] 05/25/2016  . Radicular pain in right arm [M79.2] 08/08/2012  . Acute back pain [M54.9] 07/11/2012  . Patellar contusion [S80.00XA] 07/11/2012   Total Time spent with patient: 30 minutes  Past Psychiatric History: Bipolar disorder and substance abuse.  Past Medical History:  Past Medical History:  Diagnosis Date  . Anxiety   . Arthritis    knees  . Bipolar 2 disorder ([place])   . Carpal tunnel syndrome on left 02/2016  . COPD (chronic obstructive pulmonary disease) ([place])    denies home O2  . Depression    depression, bipolar  . Headache    unspecified; 2 x/week    Past Surgical History:  Procedure Laterality Date  . CARPAL TUNNEL RELEASE Left 02/24/2016   Procedure: LEFT CARPAL TUNNEL RELEASE;  Surgeon: Leanora Cover, MD;  Location: [place];  Service: Orthopedics;  Laterality: Left;  . TOOTH EXTRACTION     Family History:  Family History  Problem Relation Age of Onset  . Diabetes Paternal Grandmother   . Heart disease Maternal Grandmother   . Cancer Maternal Grandfather   . Hypertension Mother   . ADD / ADHD Son    Family Psychiatric  History:  See H&P. Social History:  History  Alcohol Use No     History  Drug Use  . Types: Cocaine, Marijuana    Comment: last used 06/09/16    Social History   Social History  . Marital status: Single    Spouse name: N/A  . Number of children: N/A  . Years of education: N/A   Social History Main Topics  . Smoking status:  Current Every Day Smoker    Packs/day: 0.25    Years: 15.00  . Smokeless tobacco: Never Used  . Alcohol use No  . Drug use: Yes    Types: Cocaine, Marijuana     Comment: last used 06/09/16  . Sexual activity: Yes    Birth control/ protection: None   Other Topics Concern  . None   Social History Narrative  . None   Additional Social History:                         Sleep: Fair  Appetite:  Fair  Current Medications: Current Facility-Administered Medications  Medication Dose Route Frequency Provider Last Rate Last Dose  . acetaminophen (TYLENOL) tablet 650 mg  650 mg Oral Q6H PRN Pucilowska, Jolanta B, MD      . alum & mag hydroxide-simeth (MAALOX/MYLANTA) 200-200-20 MG/5ML suspension 30 mL  30 mL Oral Q4H PRN Pucilowska, Jolanta B, MD      . folic acid (FOLVITE) tablet 2 mg  2 mg Oral Daily Pucilowska, Jolanta B, MD   2 mg at 06/13/16 0836  . magnesium hydroxide (MILK OF MAGNESIA) suspension 30 mL  30 mL Oral Daily PRN Pucilowska, Jolanta B, MD      . neomycin-bacitracin-polymyxin (NEOSPORIN) ointment   Topical Daily Pucilowska, Jolanta B, MD      . nicotine (NICODERM CQ - dosed in mg/24 hours) patch 14 mg  14 mg Transdermal Daily Pucilowska, Jolanta B, MD   14 mg at 06/13/16 0837  . prenatal vitamin w/FE, FA (PRENATAL 1 + 1) 27-1 MG tablet 1 tablet  1 tablet Oral Q1200 Pucilowska, Jolanta B, MD   1 tablet at 06/13/16 0836    Lab Results:  No results found for this or any previous visit (from the past 48 hour(s)).  Blood Alcohol level:  Lab Results  Component Value Date   ETH <5 89/37/3428    Metabolic Disorder Labs: No results found for: HGBA1C, MPG No results found for: PROLACTIN No results found for: CHOL, TRIG, HDL, CHOLHDL, VLDL, LDLCALC  Physical Findings: AIMS: Facial and Oral Movements Muscles of Facial Expression: None, normal Lips and Perioral Area: None, normal Jaw: None, normal Tongue: None, normal,Extremity Movements Upper (arms, wrists,  hands, fingers): None, normal Lower (legs, knees, ankles, toes): None, normal, Trunk Movements Neck, shoulders, hips: None, normal, Overall Severity Severity of abnormal movements (highest score from questions above): None, normal Incapacitation due to abnormal movements: None, normal Patient's awareness of abnormal movements (rate only patient's report): No Awareness, Dental Status Current problems with teeth and/or dentures?: No Does patient usually wear dentures?: No  CIWA:  CIWA-Ar Total: 0 COWS:     Musculoskeletal: Strength & Muscle Tone: within normal limits Gait & Station: normal Patient leans: N/A  Psychiatric Specialty Exam: Physical Exam  Nursing note and vitals reviewed. Psychiatric: Her speech is normal and behavior is normal. Judgment and thought content normal. Cognition and memory are normal. She does not express impulsivity. She does not exhibit a depressed mood.    Review of  Systems  Psychiatric/Behavioral: Positive for substance abuse.  All other systems reviewed and are negative.   Blood pressure 106/62, pulse 83, temperature 98.6 F (37 C), temperature source Oral, resp. rate 18, height _0  (1.626 m), weight 88 kg (194 lb), SpO2 98 %.Body mass index is 33.3 kg/m.  General Appearance: Casual  Eye Contact:  Good  Speech:  Clear and Coherent  Volume:  Normal  Mood:  Anxious and Depressed  Affect:  Appropriate  Thought Process:  Goal Directed and Descriptions of Associations: Intact  Orientation:  Full (Time, Place, and Person)  Thought Content:  WDL  Suicidal Thoughts:  No  Homicidal Thoughts:  No  Memory:  Immediate;   Fair Recent;   Fair Remote;   Fair  Judgement:  Impaired  Insight:  Shallow  Psychomotor Activity:  Psychomotor Retardation  Concentration:  Concentration: Fair and Attention Span: Fair  Recall:  [employer] of Knowledge:  Fair  Language:  Fair  Akathisia:  No  Handed:  Right  AIMS (if indicated):     Assets:  Communication  Skills Desire for Improvement Financial Resources/Insurance Housing Intimacy Physical Health Resilience Social Support Transportation  ADL's:  Intact  Cognition:  WNL  Sleep:  Number of Hours: 7.3     Treatment Plan Summary: Daily contact with patient to assess and evaluate symptoms and progress in treatment and Medication management   Ms. Welge is a 41 year old female with a history of bipolar disorder admitted for suicidal ideation in the context of medication discontinuation, pregnancy, and substance use.  1. Suicidal ideation. The patient is able to contract for safety in the hospital.  2. Mood. She has been maintained on a combination of Lamictal, Celexa, and Xanax. She discontinued medication when pregnant. She is not interested in restarting medicines at present.  3. Substance abuse. The patient reports 13 years of sobriety with recent relapse on cocaine and marijuana. She denies drinking. She is not interested in residential substance abuse treatment program participation.  4. Pregnancy. She established care with a local provider. OB/GYN consult is greatly appreciated. She is taking prenatal vitamins.  5. Disposition. She will be discharged to home with family. She will follow up with [facility] in [place].  Patient reports confidence that feeling that she will continue to control her mood and suicidality outside the hospital. Understands risks of not taking medicine. Patient is hoping for discharge on Monday.   no further change to treatment plan supportive counseling completed regarding substance abuse and mental health treatment  Alethia Berthold, MD 06/13/2016, 12:26 PM

## 2016-06-13 NOTE — [facility] Group Notes (Signed)
[facility] LCSW Group Therapy  06/13/2016 2:38 PM  Type of Therapy:  Group Therapy  Participation Level:  Active  Participation Quality:  Appropriate and Sharing  Affect:  Appropriate  Cognitive:  Alert  Insight:  Developing/Improving  Engagement in Therapy:  Engaged  Modes of Intervention:  Activity, Clarification, Discussion, Education, Exploration, Limit-setting, Problem-solving, Reality Testing, Socialization and Support  Summary of Progress/Problems: Coping Skills: Patients defined and discussed healthy coping skills. Patients identified healthy coping skills they would like to try during hospitalization and after discharge. CSW offered insight to varying coping skills that may have been new to patients such as practicing mindfulness.  Trevonne Nyland G. Garnette CzechSampson MSW, LCSWA 06/13/2016, 2:38 PM

## 2016-06-13 NOTE — [facility] Group Notes (Signed)
[facility] Group Notes:  (Nursing/MHT/Case Management/Adjunct)  Date:  06/13/2016  Time:  11:24 PM  Type of Therapy:  Group Therapy  Participation Level:  Active  Participation Quality:  Appropriate  Affect:  Appropriate  Cognitive:  Appropriate  Insight:  Appropriate  Engagement in Group:  Engaged  Modes of Intervention:  Discussion  Summary of Progress/Problems:  Michelle EkJanice Marie Carlotta Payne 06/13/2016, 11:24 PM

## 2016-06-13 NOTE — Progress Notes (Signed)
Pt denies SI/HI/AVH. Voices that she is ready for discharge. Pleasant during interaction. Affect sad but brightens during interaction. Denies pain. Visible in milieu with appropriate behaviors among staff and peers. Voices no additional concerns at this time. Encouragement and support provided. Safety maintained. Will continue to monitor.

## 2016-06-13 NOTE — Plan of Care (Signed)
Problem: Health Behavior/Discharge Planning: Goal: Compliance with treatment plan for underlying cause of condition will improve Outcome: Progressing Pt attends group, socializes with peers, takes medications as ordered.

## 2016-06-13 NOTE — Progress Notes (Signed)
[facility] MD Progress Note  06/13/2016 12:24 PM Michelle Payne  MRN:  [id number]  Subjective:  Michelle Payne is a 41 year old pregnant female with history of bipolar disorder admitted for depression and suicidal ideation in the context of severe social stressors. She chooses not to take medications.  06/11/2016. Michelle Payne met with treatment team today. She reports some improvement after seeing her family yesterday. The visit went well. She feels more support. She slept well last night. She still is not interested in taking any psychotropic medications while pregnant. She still feels depressed and unable to contract for safety in the community. There are no somatic complaints. Good program participation.  Patient seen on Saturday the fifth. She is neatly dressed and groomed. Affect is calm smiling pleasant. Thoughts appear to be clear. Says she still does not want to take any medicine. She fills like her depression is under better control. No suicidal ideation. Good cooperation and interaction with others.  Per nursing: D: Pt denies SI/HI/AVH. Pt is pleasant and cooperative. Pt stated she felt better, pt stated she felt that people didn't care about her and if she were not here they would not miss her. Pt seen on unit with peers. Pt had pleasant and bright affect this evening.   A: Pt was offered support and encouragement. Pt was given scheduled medications. Pt was encourage to attend groups. Q 15 minute checks were done for safety.   R:Pt attends groups and interacts well with peers and staff. Pt is taking medication. Pt has no complaints.Pt receptive to treatment and safety maintained on unit.  Principal Problem: Bipolar 2 disorder, major depressive episode ([place]) Diagnosis:   Patient Active Problem List   Diagnosis Date Noted  . Bipolar 2 disorder, major depressive episode ([place]) [F31.81] 06/09/2016  . Cocaine use disorder, moderate, dependence ([place]) [F14.20] 06/09/2016  . Cannabis use disorder, moderate,  dependence ([place]) [F12.20] 06/09/2016  . UTI (urinary tract infection) [N39.0] 06/09/2016  . Substance induced mood disorder ([place]) [F19.94] 06/09/2016  . Tobacco use disorder [F17.200] 05/25/2016  . Antepartum multigravida of advanced maternal age [O51.529] 05/25/2016  . Pregnancy [Z34.90] 05/25/2016  . Positive pregnancy test [Z32.01] 05/25/2016  . Radicular pain in right arm [M79.2] 08/08/2012  . Acute back pain [M54.9] 07/11/2012  . Patellar contusion [S80.00XA] 07/11/2012   Total Time spent with patient: 30 minutes  Past Psychiatric History: Bipolar disorder and substance abuse.  Past Medical History:  Past Medical History:  Diagnosis Date  . Anxiety   . Arthritis    knees  . Bipolar 2 disorder ([place])   . Carpal tunnel syndrome on left 02/2016  . COPD (chronic obstructive pulmonary disease) ([place])    denies home O2  . Depression    depression, bipolar  . Headache    unspecified; 2 x/week    Past Surgical History:  Procedure Laterality Date  . CARPAL TUNNEL RELEASE Left 02/24/2016   Procedure: LEFT CARPAL TUNNEL RELEASE;  Surgeon: Leanora Cover, MD;  Location: [place];  Service: Orthopedics;  Laterality: Left;  . TOOTH EXTRACTION     Family History:  Family History  Problem Relation Age of Onset  . Diabetes Paternal Grandmother   . Heart disease Maternal Grandmother   . Cancer Maternal Grandfather   . Hypertension Mother   . ADD / ADHD Son    Family Psychiatric  History: See H&P. Social History:  History  Alcohol Use No     History  Drug Use  . Types: Cocaine, Marijuana  Comment: last used 06/09/16    Social History   Social History  . Marital status: Single    Spouse name: N/A  . Number of children: N/A  . Years of education: N/A   Social History Main Topics  . Smoking status: Current Every Day Smoker    Packs/day: 0.25    Years: 15.00  . Smokeless tobacco: Never Used  . Alcohol use No  . Drug use: Yes    Types: Cocaine, Marijuana      Comment: last used 06/09/16  . Sexual activity: Yes    Birth control/ protection: None   Other Topics Concern  . None   Social History Narrative  . None   Additional Social History:                         Sleep: Fair  Appetite:  Fair  Current Medications: Current Facility-Administered Medications  Medication Dose Route Frequency Provider Last Rate Last Dose  . acetaminophen (TYLENOL) tablet 650 mg  650 mg Oral Q6H PRN Pucilowska, Jolanta B, MD      . alum & mag hydroxide-simeth (MAALOX/MYLANTA) 200-200-20 MG/5ML suspension 30 mL  30 mL Oral Q4H PRN Pucilowska, Jolanta B, MD      . folic acid (FOLVITE) tablet 2 mg  2 mg Oral Daily Pucilowska, Jolanta B, MD   2 mg at 06/13/16 0836  . magnesium hydroxide (MILK OF MAGNESIA) suspension 30 mL  30 mL Oral Daily PRN Pucilowska, Jolanta B, MD      . neomycin-bacitracin-polymyxin (NEOSPORIN) ointment   Topical Daily Pucilowska, Jolanta B, MD      . nicotine (NICODERM CQ - dosed in mg/24 hours) patch 14 mg  14 mg Transdermal Daily Pucilowska, Jolanta B, MD   14 mg at 06/13/16 0837  . prenatal vitamin w/FE, FA (PRENATAL 1 + 1) 27-1 MG tablet 1 tablet  1 tablet Oral Q1200 Pucilowska, Jolanta B, MD   1 tablet at 06/13/16 0836    Lab Results:  No results found for this or any previous visit (from the past 48 hour(s)).  Blood Alcohol level:  Lab Results  Component Value Date   ETH <5 60/11/9321    Metabolic Disorder Labs: No results found for: HGBA1C, MPG No results found for: PROLACTIN No results found for: CHOL, TRIG, HDL, CHOLHDL, VLDL, LDLCALC  Physical Findings: AIMS: Facial and Oral Movements Muscles of Facial Expression: None, normal Lips and Perioral Area: None, normal Jaw: None, normal Tongue: None, normal,Extremity Movements Upper (arms, wrists, hands, fingers): None, normal Lower (legs, knees, ankles, toes): None, normal, Trunk Movements Neck, shoulders, hips: None, normal, Overall Severity Severity of  abnormal movements (highest score from questions above): None, normal Incapacitation due to abnormal movements: None, normal Patient's awareness of abnormal movements (rate only patient's report): No Awareness, Dental Status Current problems with teeth and/or dentures?: No Does patient usually wear dentures?: No  CIWA:  CIWA-Ar Total: 0 COWS:     Musculoskeletal: Strength & Muscle Tone: within normal limits Gait & Station: normal Patient leans: N/A  Psychiatric Specialty Exam: Physical Exam  Nursing note and vitals reviewed. Psychiatric: Her speech is normal and behavior is normal. Judgment and thought content normal. Cognition and memory are normal. She does not express impulsivity. She does not exhibit a depressed mood.    Review of Systems  Psychiatric/Behavioral: Positive for substance abuse.  All other systems reviewed and are negative.   Blood pressure 106/62, pulse 83, temperature 98.6 F (37  C), temperature source Oral, resp. rate 18, height _0  (1.626 m), weight 88 kg (194 lb), SpO2 98 %.Body mass index is 33.3 kg/m.  General Appearance: Casual  Eye Contact:  Good  Speech:  Clear and Coherent  Volume:  Normal  Mood:  Anxious and Depressed  Affect:  Appropriate  Thought Process:  Goal Directed and Descriptions of Associations: Intact  Orientation:  Full (Time, Place, and Person)  Thought Content:  WDL  Suicidal Thoughts:  No  Homicidal Thoughts:  No  Memory:  Immediate;   Fair Recent;   Fair Remote;   Fair  Judgement:  Impaired  Insight:  Shallow  Psychomotor Activity:  Psychomotor Retardation  Concentration:  Concentration: Fair and Attention Span: Fair  Recall:  [employer] of Knowledge:  Fair  Language:  Fair  Akathisia:  No  Handed:  Right  AIMS (if indicated):     Assets:  Communication Skills Desire for Improvement Financial Resources/Insurance Housing Intimacy Physical Health Resilience Social Support Transportation  ADL's:  Intact   Cognition:  WNL  Sleep:  Number of Hours: 7.3     Treatment Plan Summary: Daily contact with patient to assess and evaluate symptoms and progress in treatment and Medication management   Michelle Payne is a 41 year old female with a history of bipolar disorder admitted for suicidal ideation in the context of medication discontinuation, pregnancy, and substance use.  1. Suicidal ideation. The patient is able to contract for safety in the hospital.  2. Mood. She has been maintained on a combination of Lamictal, Celexa, and Xanax. She discontinued medication when pregnant. She is not interested in restarting medicines at present.  3. Substance abuse. The patient reports 13 years of sobriety with recent relapse on cocaine and marijuana. She denies drinking. She is not interested in residential substance abuse treatment program participation.  4. Pregnancy. She established care with a local provider. OB/GYN consult is greatly appreciated. She is taking prenatal vitamins.  5. Disposition. She will be discharged to home with family. She will follow up with [facility] in [place].  Patient reports confidence that feeling that she will continue to control her mood and suicidality outside the hospital. Understands risks of not taking medicine. Patient is hoping for discharge on Monday.    Alethia Berthold, MD 06/13/2016, 12:24 PM

## 2016-06-14 DIAGNOSIS — F3181 Bipolar II disorder: Secondary | ICD-10-CM

## 2016-06-14 NOTE — [facility] Suicide Risk Assessment (Signed)
[facility]   Principal Problem: Bipolar 2 disorder, major depressive episode [facility](HCC) Discharge Diagnoses:  Patient Active Problem List   Diagnosis Date Noted  . Bipolar 2 disorder, major depressive episode (HCC) [F31.81] 06/09/2016  . Cocaine use disorder, moderate, dependence (HCC) [F14.20] 06/09/2016  . Cannabis use disorder, moderate, dependence (HCC) [F12.20] 06/09/2016  . UTI (urinary tract infection) [N39.0] 06/09/2016  . Substance induced mood disorder (HCC) [F19.94] 06/09/2016  . Tobacco use disorder [F17.200] 05/25/2016  . Antepartum multigravida of advanced maternal age 76[O09.529] 05/25/2016  . Pregnancy [Z34.90] 05/25/2016  . Positive pregnancy test [Z32.01] 05/25/2016  . Radicular pain in right arm [M79.2] 08/08/2012  . Acute back pain [M54.9] 07/11/2012  . Patellar contusion [S80.00XA] 07/11/2012    Total Time spent with patient: 30 minutes  Musculoskeletal: Strength & Muscle Tone: within normal limits Gait & Station: normal Patient leans: N/A  Psychiatric Specialty Exam: Review of Systems  Psychiatric/Behavioral: Positive for substance abuse.  All other systems reviewed and are negative.   Blood pressure 108/66, pulse 77, temperature 98 F (36.7 C), temperature source Oral, resp. rate 16, height 5\' 4"  (1.626 m), weight 88 kg (194 lb), SpO2 98 %.Body mass index is 33.3 kg/m.  General Appearance: Casual  Eye Contact::  Good  Speech:  Clear and Coherent409  Volume:  Normal  Mood:  Euthymic  Affect:  Appropriate  Thought Process:  Goal Directed and Descriptions of Associations: Intact  Orientation:  Full (Time, Place, and Person)  Thought Content:  WDL  Suicidal Thoughts:  No  Homicidal Thoughts:  No  Memory:  Immediate;   Fair Recent;   Fair Remote;   Fair  Judgement:  Impaired  Insight:  Present  Psychomotor Activity:  Normal  Concentration:  Fair  Recall:  [employer]Fair  Fund of Knowledge:Fair  Language: Fair  Akathisia:  No   Handed:  Right  AIMS (if indicated):     Assets:  Communication Skills Desire for Improvement Financial Resources/Insurance Housing Intimacy Physical Health Resilience Social Support Transportation  Sleep:  Number of Hours: 7.45  Cognition: WNL  ADL's:  Intact   Mental Status Per Nursing Assessment::   On Admission:  NA  Demographic Factors:  Caucasian and Unemployed  Loss Factors: Financial problems/change in socioeconomic status  Historical Factors: Family history of mental illness or substance abuse and Impulsivity  Risk Reduction Factors:   Pregnancy, Responsible for children under 40 years of age, Sense of responsibility to family, Living with another person, especially a relative and Positive social support  Continued Clinical Symptoms:  Bipolar Disorder:   Bipolar II Depressive phase Alcohol/Substance Abuse/Dependencies  Cognitive Features That Contribute To Risk:  None    Suicide Risk:  Minimal: No identifiable suicidal ideation.  Patients presenting with no risk factors but with morbid ruminations; may be classified as minimal risk based on the severity of the depressive symptoms  Follow-up Information    Services, [facility]. Go on 06/13/2016.   Why:  Please follow-up with [facility] on May 8th between 8AM-10 AM. It is important to get there as early as possible for prompt services. Please bring your ID, SS card, medicaid card, and proof of household income.  Contact information: [address] [place] [id number] [phone]           Plan Of Care/Follow-up recommendations:  Activity:  as tolerated. Diet:  low sodium heart healthy. Other:  keep follow up appointment.  Kristine LineaJolanta Derian Pfost, MD 06/14/2016, 10:23 AM

## 2016-06-14 NOTE — Progress Notes (Signed)
  [facility] :  Will you be returning to the same living situation after discharge:  Yes,  returning home. At discharge, do you have transportation home?: Yes,  family will pick pt up. Do you have the ability to pay for your medications: Yes,  Medicaid.  Release of information consent forms completed and in the chart;  Patient's signature needed at discharge.  Patient to Follow up at: Follow-up Information    [facility], [facility]. Go on 06/13/2016.   Why:  Please follow-up with [facility] on May 8th between 8AM-10 AM. It is important to get there as early as possible for prompt services. Please bring your ID, SS card, medicaid card, and proof of household income.  Contact information: [address] [place] [address] [phone]           Next level of care provider has access to [facility] Link:no  Safety Planning and Suicide Prevention discussed: Yes,  SPE handout provided to patient.  Have you used any form of tobacco in the last 30 days? (Cigarettes, Smokeless Tobacco, Cigars, and/or Pipes): Yes  Has patient been referred to the Quitline?: Patient refused referral  Patient has been referred for addiction treatment: Pt. refused referral  Lynden OxfordKadijah R Mcclellan Payne, MSW, LCSWA 06/14/2016, 11:45 AM

## 2016-06-14 NOTE — Progress Notes (Signed)
Patient denies SI/HI, denies A/V hallucinations. Patient verbalizes understanding of discharge instructions, follow up care and prescriptions.Waiting for ride. 

## 2016-06-14 NOTE — Plan of Care (Signed)
Problem: [facility] Participation in Recreation Therapeutic Interventions Goal: STG-Patient will demonstrate improved self esteem by identif STG: Self-Esteem - Within 4 treatment sessions, patient will verbalize at least 5 positive affirmation statements in each of 2 treatment sessions to increase self-esteem.  Outcome: Completed/Met Date Met: 06/14/16 Treatment Session 2; Completed 2 out of 2: At approximately 8:45 am, LRT met with patient in patient room. Patient verbalized 5 positive affirmation statements. Patient reported it felt "better than before". LRT encouraged patient to continue saying positive affirmation statements.  Leonette Monarch, LRT/CTRS 05.07.18 10:34 am Goal: STG-Other Recreation Therapy Goal (Specify) STG: Stress Management - Within 4 treatment sessions, patient will verbalize understanding of the stress management techniques in each of 2 treatment sessions to increase stress management skills.  Outcome: Completed/Met Date Met: 06/14/16 Treatment Session 2; Completed 2 out of 2: At approximately 8:45 am, LRT met with patient in patient room. Patient reported she read over and practiced the stress management techniques. Patient verbalized understanding and reported the techniques were helpful. LRT encouraged patient to continue practicing the stress management techniques.  Leonette Monarch, LRT/CTRS 05.07.18 10:35 am

## 2016-06-14 NOTE — Progress Notes (Signed)
Recreation Therapy Notes  Date: 05.07.18 Time: 1:00 pm Location: Craft Room  Group Topic: Wellness  Goal Area(s) Addresses:  Patient will identify at least one item per dimension of health. Patient will examine areas they are deficient in.  Behavioral Response: Did not attend  Intervention: 6 Dimensions of Wellness  Activity: Patients were given a definition sheet defining each dimension of health and a worksheet with each dimension listed. Patients were instructed to write what they were currently doing in each dimension of health.  Education: LRT educated patients on ways they can improve each dimension of health.  Education Outcome: Patient did not attend group.   Clinical Observations/Feedback: Patient did not attend group.  Jacquelynn CreeGreene,Laira Penninger M, LRT/CTRS 06/14/2016 1:38 PM
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
## 2016-06-14 NOTE — Progress Notes (Signed)
Recreation Therapy Notes  INPATIENT RECREATION TR PLAN  Patient Details Name: [name] MRN: [id number] DOB: 1975-05-21 Today's Date: 06/14/2016  Rec Therapy Plan Is patient appropriate for Therapeutic Recreation?: Yes Treatment times per week: At least once a week TR Treatment/Interventions: 1:1 session, Group participation (Comment) (Appropriate participation in daily recreational therapy tx)  Discharge Criteria Pt will be discharged from therapy if:: Treatment goals are met, Discharged Treatment plan/goals/alternatives discussed and agreed upon by:: Patient/family  Discharge Summary Short term goals set: See Care Plan Short term goals met: Complete Progress toward goals comments: One-to-one attended Which groups?: Leisure education, Social skills One-to-one attended: Self-esteem, stress management Reason goals not met: N/A Therapeutic equipment acquired: None Reason patient discharged from therapy: Discharge from hospital Pt/family agrees with progress & goals achieved: Yes Date patient discharged from therapy: 06/14/16   Leonette Monarch, LRT/CTRS 06/14/2016, 3:06 PM

## 2016-06-22 ENCOUNTER — Ambulatory Visit (INDEPENDENT_AMBULATORY_CARE_PROVIDER_SITE_OTHER): Payer: Medicaid Other | Admitting: Women's Health

## 2016-06-22 ENCOUNTER — Ambulatory Visit: Payer: Medicaid Other | Admitting: *Deleted

## 2016-06-22 ENCOUNTER — Encounter: Payer: Self-pay | Admitting: Women's Health

## 2016-06-22 VITALS — BP 82/52 | HR 70 | Wt 201.6 lb

## 2016-06-22 DIAGNOSIS — Z3A12 12 weeks gestation of pregnancy: Secondary | ICD-10-CM

## 2016-06-22 DIAGNOSIS — O09529 Supervision of elderly multigravida, unspecified trimester: Secondary | ICD-10-CM | POA: Insufficient documentation

## 2016-06-22 DIAGNOSIS — O0990 Supervision of high risk pregnancy, unspecified, unspecified trimester: Secondary | ICD-10-CM | POA: Insufficient documentation

## 2016-06-22 DIAGNOSIS — Z331 Pregnant state, incidental: Secondary | ICD-10-CM

## 2016-06-22 DIAGNOSIS — Z3682 Encounter for antenatal screening for nuchal translucency: Secondary | ICD-10-CM

## 2016-06-22 DIAGNOSIS — F418 Other specified anxiety disorders: Secondary | ICD-10-CM | POA: Insufficient documentation

## 2016-06-22 DIAGNOSIS — O0991 Supervision of high risk pregnancy, unspecified, first trimester: Secondary | ICD-10-CM | POA: Diagnosis not present

## 2016-06-22 DIAGNOSIS — Z1389 Encounter for screening for other disorder: Secondary | ICD-10-CM | POA: Diagnosis not present

## 2016-06-22 DIAGNOSIS — Z3481 Encounter for supervision of other normal pregnancy, first trimester: Secondary | ICD-10-CM

## 2016-06-22 DIAGNOSIS — F1910 Other psychoactive substance abuse, uncomplicated: Secondary | ICD-10-CM

## 2016-06-22 DIAGNOSIS — F17200 Nicotine dependence, unspecified, uncomplicated: Secondary | ICD-10-CM

## 2016-06-22 DIAGNOSIS — O09521 Supervision of elderly multigravida, first trimester: Secondary | ICD-10-CM

## 2016-06-22 LAB — POCT URINALYSIS DIPSTICK
Blood, UA: NEGATIVE
GLUCOSE UA: NEGATIVE
KETONES UA: NEGATIVE
LEUKOCYTES UA: NEGATIVE
Nitrite, UA: NEGATIVE
Protein, UA: NEGATIVE
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
## 2016-06-22 NOTE — Patient Instructions (Signed)

## 2016-06-23 ENCOUNTER — Telehealth: Payer: Self-pay | Admitting: *Deleted

## 2016-06-23 LAB — VARICELLA ZOSTER ANTIBODY, IGG: VARICELLA: 1755 {index} (ref >165)

## 2016-06-23 LAB — CBC
HEMATOCRIT: 37.9 % (ref 34.0–46.6)
Hemoglobin: 12.6 g/dL (ref 11.1–15.9)
MCH: 31.9 pg (ref 26.6–33.0)
MCHC: 33.2 g/dL (ref 31.5–35.7)
MCV: 96 fL (ref 79–97)
Platelets: 214 10*3/uL (ref 150–379)
RBC: 3.95 x10E6/uL (ref 3.77–5.28)
RDW: 14.4 % (ref 12.3–15.4)
WBC: 10 10*3/uL (ref 3.4–10.8)

## 2016-06-23 LAB — RUBELLA SCREEN: RUBELLA: 3.61 {index} (ref >0.99)

## 2016-06-23 LAB — SICKLE CELL SCREEN: Sickle Cell Screen: NEGATIVE

## 2016-06-23 LAB — PMP SCREEN PROFILE (10S), URINE
AMPHETAMINE SCREEN URINE: NEGATIVE ng/mL
BARBITURATE SCREEN URINE: NEGATIVE ng/mL
BENZODIAZEPINE SCREEN, URINE: NEGATIVE ng/mL
CANNABINOIDS UR QL SCN: NEGATIVE ng/mL
CREATININE(CRT), U: 69 mg/dL (ref 20.0–300.0)
Cocaine (Metab) Scrn, Ur: NEGATIVE ng/mL
Methadone Screen, Urine: NEGATIVE ng/mL
OPIATE SCREEN URINE: NEGATIVE ng/mL
OXYCODONE+OXYMORPHONE UR QL SCN: NEGATIVE ng/mL
PH UR, DRUG SCRN: 5.5 (ref 4.5–8.9)
PHENCYCLIDINE QUANTITATIVE URINE: NEGATIVE ng/mL
Propoxyphene Scrn, Ur: NEGATIVE ng/mL

## 2016-06-23 LAB — URINALYSIS, ROUTINE W REFLEX MICROSCOPIC
Bilirubin, UA: NEGATIVE
GLUCOSE, UA: NEGATIVE
Ketones, UA: NEGATIVE
Nitrite, UA: NEGATIVE
PH UA: 5.5 (ref 5.0–7.5)
Protein, UA: NEGATIVE
RBC, UA: NEGATIVE
Specific Gravity, UA: 1.014 (ref 1.005–1.030)
UUROB: 0.2 mg/dL (ref 0.2–1.0)

## 2016-06-23 LAB — ABO/RH: Rh Factor: POSITIVE

## 2016-06-23 LAB — MICROSCOPIC EXAMINATION: Casts: NONE SEEN /lpf

## 2016-06-23 LAB — HIV ANTIBODY (ROUTINE TESTING W REFLEX): HIV Screen 4th Generation wRfx: NONREACTIVE

## 2016-06-23 LAB — ANTIBODY SCREEN: Antibody Screen: NEGATIVE

## 2016-06-23 LAB — HEPATITIS B SURFACE ANTIGEN: Hepatitis B Surface Ag: NEGATIVE

## 2016-06-23 LAB — RPR: RPR: NONREACTIVE

## 2016-06-23 NOTE — Telephone Encounter (Signed)
LMOVM that fax came from pharmacy for clarification of Xanax. Informed that per Selena BattenKim she does not need to take during pregnancy and if she starts to have problems with anxiety, to give us a call to talk with one of the providers. Advised to call if further questions.

## 2016-06-23 NOTE — Telephone Encounter (Signed)
Fax from pharmacy states patient is pregnancy and xanax is not recommended. Please advise.

## 2016-06-24 LAB — GC/CHLAMYDIA PROBE AMP
Chlamydia trachomatis, NAA: NEGATIVE
NEISSERIA GONORRHOEAE BY PCR: NEGATIVE

## 2016-06-24 LAB — URINE CULTURE

## 2016-06-29 ENCOUNTER — Other Ambulatory Visit: Payer: Medicaid Other

## 2016-06-29 ENCOUNTER — Ambulatory Visit (INDEPENDENT_AMBULATORY_CARE_PROVIDER_SITE_OTHER): Payer: Medicaid Other

## 2016-06-29 ENCOUNTER — Telehealth: Payer: Self-pay | Admitting: Obstetrics & Gynecology

## 2016-06-29 DIAGNOSIS — Z3682 Encounter for antenatal screening for nuchal translucency: Secondary | ICD-10-CM

## 2016-06-29 NOTE — Telephone Encounter (Signed)
Spoke to patient and informed her that Myra from [facility] has attempted to call her but she has not responded. Patient states she has a new phone number and messages were on her other phone. Number give for [facility] and encouraged to call.

## 2016-06-29 NOTE — Progress Notes (Signed)
US 13+4 wks,ant pl gr 0,normal ovaries bilat,crl 79.62 mm,fhr 144 bpm,NB present,NT 2.5 mm

## 2016-07-02 LAB — MATERNAL SCREEN, INTEGRATED #1
CROWN RUMP LENGTH MAT SCREEN: 79.6 mm
GEST. AGE ON COLLECTION DATE: 13.3 wk
Maternal Age at EDD: 40.1 yr
Nuchal Translucency (NT): 2.5 mm
Number of Fetuses: 1
PAPP-A Value: 1422.5 ng/mL
WEIGHT: 204 [lb_av]

## 2016-07-08 ENCOUNTER — Telehealth: Payer: Self-pay | Admitting: *Deleted

## 2016-07-08 NOTE — Telephone Encounter (Signed)
Chase called from [facility] stating that pt had called several times requesting a refill on her alprazolam. Upon reviewing pts chart it appeared that the medication was not prescribed by us, but by her primary MD. I advised Michelle Payne that she informed us at her prenatal visits that she had not been taking that medication and that we could not advise her to start taking it again. Chase stated he would call pt to discuss this with her and then call the prescribing MD if he needed to.

## 2016-07-20 ENCOUNTER — Ambulatory Visit (INDEPENDENT_AMBULATORY_CARE_PROVIDER_SITE_OTHER): Payer: Medicaid Other | Admitting: Women's Health

## 2016-07-20 ENCOUNTER — Encounter: Payer: Self-pay | Admitting: Women's Health

## 2016-07-20 VITALS — BP 100/60 | HR 80 | Wt 209.6 lb

## 2016-07-20 DIAGNOSIS — Z1389 Encounter for screening for other disorder: Secondary | ICD-10-CM | POA: Diagnosis not present

## 2016-07-20 DIAGNOSIS — Z331 Pregnant state, incidental: Secondary | ICD-10-CM | POA: Diagnosis not present

## 2016-07-20 DIAGNOSIS — F1910 Other psychoactive substance abuse, uncomplicated: Secondary | ICD-10-CM

## 2016-07-20 DIAGNOSIS — Z363 Encounter for antenatal screening for malformations: Secondary | ICD-10-CM | POA: Diagnosis not present

## 2016-07-20 DIAGNOSIS — O0992 Supervision of high risk pregnancy, unspecified, second trimester: Secondary | ICD-10-CM

## 2016-07-20 DIAGNOSIS — Z3682 Encounter for antenatal screening for nuchal translucency: Secondary | ICD-10-CM | POA: Diagnosis not present

## 2016-07-20 LAB — POCT URINALYSIS DIPSTICK
Glucose, UA: NEGATIVE
KETONES UA: NEGATIVE
Leukocytes, UA: NEGATIVE
Nitrite, UA: NEGATIVE
PROTEIN UA: NEGATIVE
RBC UA: NEGATIVE

## 2016-07-20 NOTE — Patient Instructions (Addendum)
Call [facility] to schedule appointment [phone]  For your lower back pain you may:  Purchase a pregnancy belt from [employer], [employer], [employer], etc and wear it while you are up and about  Take warm baths  Use a heating pad to your lower back for no longer than 20 minutes at a time, and do not place near abdomen  Take tylenol as needed. Please follow directions on the bottle  Kinesthesiology tape (can get from sporting goods store), google how to tape belly for pregnancy   Second Trimester of Pregnancy The second trimester is from week 14 through week 27 (months 4 through 6). The second trimester is often a time when you feel your best. Your body has adjusted to being pregnant, and you begin to feel better physically. Usually, morning sickness has lessened or quit completely, you may have more energy, and you may have an increase in appetite. The second trimester is also a time when the fetus is growing rapidly. At the end of the sixth month, the fetus is about 9 inches long and weighs about 1 pounds. You will likely begin to feel the baby move (quickening) between 16 and 20 weeks of pregnancy. Body changes during your second trimester Your body continues to go through many changes during your second trimester. The changes vary from woman to woman.  Your weight will continue to increase. You will notice your lower abdomen bulging out.  You may begin to get stretch marks on your hips, abdomen, and breasts.  You may develop headaches that can be relieved by medicines. The medicines should be approved by your health care provider.  You may urinate more often because the fetus is pressing on your bladder.  You may develop or continue to have heartburn as a result of your pregnancy.  You may develop constipation because certain hormones are causing the muscles that push waste through your intestines to slow down.  You may develop hemorrhoids or swollen, bulging  veins (varicose veins).  You may have back pain. This is caused by: ? Weight gain. ? Pregnancy hormones that are relaxing the joints in your pelvis. ? A shift in weight and the muscles that support your balance.  Your breasts will continue to grow and they will continue to become tender.  Your gums may bleed and may be sensitive to brushing and flossing.  Dark spots or blotches (chloasma, mask of pregnancy) may develop on your face. This will likely fade after the baby is born.  A dark line from your belly button to the pubic area (linea nigra) may appear. This will likely fade after the baby is born.  You may have changes in your hair. These can include thickening of your hair, rapid growth, and changes in texture. Some women also have hair loss during or after pregnancy, or hair that feels dry or thin. Your hair will most likely return to normal after your baby is born.  What to expect at prenatal visits During a routine prenatal visit:  You will be weighed to make sure you and the fetus are growing normally.  Your blood pressure will be taken.  Your abdomen will be measured to track your baby's growth.  The fetal heartbeat will be listened to.  Any test results from the previous visit will be discussed.  Your health care provider may ask you:  How you are feeling.  If you are feeling the baby move.  If you have had any abnormal  symptoms, such as leaking fluid, bleeding, severe headaches, or abdominal cramping.  If you are using any tobacco products, including cigarettes, chewing tobacco, and electronic cigarettes.  If you have any questions.  Other tests that may be performed during your second trimester include:  Blood tests that check for: ? Low iron levels (anemia). ? High blood sugar that affects pregnant women (gestational diabetes) between 2724 and 28 weeks. ? Rh antibodies. This is to check for a protein on red blood cells (Rh factor).  Urine tests to check  for infections, diabetes, or protein in the urine.  An ultrasound to confirm the proper growth and development of the baby.  An amniocentesis to check for possible genetic problems.  Fetal screens for spina bifida and Down syndrome.  HIV (human immunodeficiency virus) testing. Routine prenatal testing includes screening for HIV, unless you choose not to have this test.  Follow these instructions at home: Medicines  Follow your health care provider's instructions regarding medicine use. Specific medicines may be either safe or unsafe to take during pregnancy.  Take a prenatal vitamin that contains at least 600 micrograms (mcg) of folic acid.  If you develop constipation, try taking a stool softener if your health care provider approves. Eating and drinking  Eat a balanced diet that includes fresh fruits and vegetables, whole grains, good sources of protein such as meat, eggs, or tofu, and low-fat dairy. Your health care provider will help you determine the amount of weight gain that is right for you.  Avoid raw meat and uncooked cheese. These carry germs that can cause birth defects in the baby.  If you have low calcium intake from food, talk to your health care provider about whether you should take a daily calcium supplement.  Limit foods that are high in fat and processed sugars, such as fried and sweet foods.  To prevent constipation: ? Drink enough fluid to keep your urine clear or pale yellow. ? Eat foods that are high in fiber, such as fresh fruits and vegetables, whole grains, and beans. Activity  Exercise only as directed by your health care provider. Most women can continue their usual exercise routine during pregnancy. Try to exercise for 30 minutes at least 5 days a week. Stop exercising if you experience uterine contractions.  Avoid heavy lifting, wear low heel shoes, and practice good posture.  A sexual relationship may be continued unless your health care provider  directs you otherwise. Relieving pain and discomfort  Wear a good support bra to prevent discomfort from breast tenderness.  Take warm sitz baths to soothe any pain or discomfort caused by hemorrhoids. Use hemorrhoid cream if your health care provider approves.  Rest with your legs elevated if you have leg cramps or low back pain.  If you develop varicose veins, wear support hose. Elevate your feet for 15 minutes, 3-4 times a day. Limit salt in your diet. Prenatal Care  Write down your questions. Take them to your prenatal visits.  Keep all your prenatal visits as told by your health care provider. This is important. Safety  Wear your seat belt at all times when driving.  Make a list of emergency phone numbers, including numbers for family, friends, the hospital, and police and fire departments. General instructions  Ask your health care provider for a referral to a local prenatal education class. Begin classes no later than the beginning of month 6 of your pregnancy.  Ask for help if you have counseling or nutritional needs during  pregnancy. Your health care provider can offer advice or refer you to specialists for help with various needs.  Do not use hot tubs, steam rooms, or saunas.  Do not douche or use tampons or scented sanitary pads.  Do not cross your legs for long periods of time.  Avoid cat litter boxes and soil used by cats. These carry germs that can cause birth defects in the baby and possibly loss of the fetus by miscarriage or stillbirth.  Avoid all smoking, herbs, alcohol, and unprescribed drugs. Chemicals in these products can affect the formation and growth of the baby.  Do not use any products that contain nicotine or tobacco, such as cigarettes and e-cigarettes. If you need help quitting, ask your health care provider.  Visit your dentist if you have not gone yet during your pregnancy. Use a soft toothbrush to brush your teeth and be gentle when you  floss. Contact a health care provider if:  You have dizziness.  You have mild pelvic cramps, pelvic pressure, or nagging pain in the abdominal area.  You have persistent nausea, vomiting, or diarrhea.  You have a bad smelling vaginal discharge.  You have pain when you urinate. Get help right away if:  You have a fever.  You are leaking fluid from your vagina.  You have spotting or bleeding from your vagina.  You have severe abdominal cramping or pain.  You have rapid weight gain or weight loss.  You have shortness of breath with chest pain.  You notice sudden or extreme swelling of your face, hands, ankles, feet, or legs.  You have not felt your baby move in over an hour.  You have severe headaches that do not go away when you take medicine.  You have vision changes. Summary  The second trimester is from week 14 through week 27 (months 4 through 6). It is also a time when the fetus is growing rapidly.  Your body goes through many changes during pregnancy. The changes vary from woman to woman.  Avoid all smoking, herbs, alcohol, and unprescribed drugs. These chemicals affect the formation and growth your baby.  Do not use any tobacco products, such as cigarettes, chewing tobacco, and e-cigarettes. If you need help quitting, ask your health care provider.  Contact your health care provider if you have any questions. Keep all prenatal visits as told by your health care provider. This is important. This information is not intended to replace advice given to you by your health care provider. Make sure you discuss any questions you have with your health care provider. Document Released: 01/19/2001 Document Revised: 07/03/2015 Document Reviewed: 03/28/2012 Elsevier Interactive Patient Education  2017 [employer].

## 2016-07-20 NOTE — Progress Notes (Signed)
High Risk Pregnancy Diagnosis(es): AMA, polysubstance abuse [id number] 4641w4d Estimated Date of Delivery: 12/31/16 BP 100/60   Pulse 80   Wt 209 lb 9.6 oz (95.1 kg)   LMP  (LMP Unknown)   BMI 35.98 kg/m   Urinalysis: Negative HPI:  Doing well, reports no drug use since last visit BP, weight, and urine reviewed.  Reports good fm. Denies regular uc's, lof, vb, uti s/s. Some low back pain. [facility] referral sent at new ob visit d/t dep/anx- no meds and pt wanted referral- they were unable to contact pt- our office notified her and she was supposed to contact them but lost number. Number given to her again today  Fundal Height:  16wks Fetal Heart rate:  161 Edema:  none  Reviewed warning s/s to report. Gave printed info on low back pain All questions were answered Assessment: 541w4d AMA, polysubstance abuse Medication(s) Plans:  Baby ASA Treatment Plan:  U/S @ 20, 24, 28, 32, 36wks    2x/wk testing nst/sono @ 36wks    Deliver @ 40wks Follow up in 2wks for high-risk OB appt and anatomy u/s 2nd IT and UDS today

## 2016-07-21 LAB — PMP SCREEN PROFILE (10S), URINE
Amphetamine Scrn, Ur: NEGATIVE ng/mL
BARBITURATE SCREEN URINE: NEGATIVE ng/mL
BENZODIAZEPINE SCREEN, URINE: NEGATIVE ng/mL
CANNABINOIDS UR QL SCN: NEGATIVE ng/mL
Cocaine (Metab) Scrn, Ur: NEGATIVE ng/mL
Creatinine(Crt), U: 73.5 mg/dL (ref 20.0–300.0)
Methadone Screen, Urine: NEGATIVE ng/mL
OPIATE SCREEN URINE: NEGATIVE ng/mL
OXYCODONE+OXYMORPHONE UR QL SCN: NEGATIVE ng/mL
Ph of Urine: 7 (ref 4.5–8.9)
Phencyclidine Qn, Ur: NEGATIVE ng/mL
Propoxyphene Scrn, Ur: NEGATIVE ng/mL

## 2016-07-23 LAB — MATERNAL SCREEN, INTEGRATED #2
AFP MARKER: 38.1 ng/mL
AFP MoM: 1.5
CROWN RUMP LENGTH: 79.6 mm
DIA MOM: 1.54
DIA VALUE: 214.9 pg/mL
Estriol, Unconjugated: 1.08 ng/mL
GESTATIONAL AGE: 16.3 wk
Gest. Age on Collection Date: 13.3 weeks
HCG MOM: 1.26
Maternal Age at EDD: 40.1 yr
NUCHAL TRANSLUCENCY MOM: 1.29
Nuchal Translucency (NT): 2.5 mm
Number of Fetuses: 1
PAPP-A MOM: 1.65
PAPP-A VALUE: 1422.5 ng/mL
TEST RESULTS: NEGATIVE
Weight: 204 [lb_av]
Weight: 204 [lb_av]
hCG Value: 32.5 IU/mL
uE3 MoM: 1.45

## 2016-08-03 ENCOUNTER — Ambulatory Visit (INDEPENDENT_AMBULATORY_CARE_PROVIDER_SITE_OTHER): Payer: Medicaid Other | Admitting: Obstetrics & Gynecology

## 2016-08-03 ENCOUNTER — Encounter: Payer: Self-pay | Admitting: Obstetrics & Gynecology

## 2016-08-03 ENCOUNTER — Ambulatory Visit (INDEPENDENT_AMBULATORY_CARE_PROVIDER_SITE_OTHER): Payer: Medicaid Other

## 2016-08-03 VITALS — BP 120/70 | HR 76 | Wt 219.0 lb

## 2016-08-03 DIAGNOSIS — Z331 Pregnant state, incidental: Secondary | ICD-10-CM

## 2016-08-03 DIAGNOSIS — Z363 Encounter for antenatal screening for malformations: Secondary | ICD-10-CM

## 2016-08-03 DIAGNOSIS — O09522 Supervision of elderly multigravida, second trimester: Secondary | ICD-10-CM | POA: Diagnosis not present

## 2016-08-03 DIAGNOSIS — F1910 Other psychoactive substance abuse, uncomplicated: Secondary | ICD-10-CM

## 2016-08-03 DIAGNOSIS — Z3482 Encounter for supervision of other normal pregnancy, second trimester: Secondary | ICD-10-CM

## 2016-08-03 DIAGNOSIS — Z1389 Encounter for screening for other disorder: Secondary | ICD-10-CM | POA: Diagnosis not present

## 2016-08-03 LAB — POCT URINALYSIS DIPSTICK
GLUCOSE UA: NEGATIVE
Ketones, UA: NEGATIVE
Leukocytes, UA: NEGATIVE
NITRITE UA: NEGATIVE
Protein, UA: NEGATIVE
RBC UA: NEGATIVE

## 2016-08-03 NOTE — Progress Notes (Signed)
[id number] 5964w4d Estimated Date of Delivery: 12/31/16  Blood pressure 120/70, pulse 76, weight 219 lb (99.3 kg).   BP weight and urine results all reviewed and noted.  Please refer to the obstetrical flow sheet for the fundal height and fetal heart rate documentation:  Patient reports good fetal movement, denies any bleeding and no rupture of membranes symptoms or regular contractions. Patient is without complaints. All questions were answered.  Orders Placed This Encounter  Procedures  . POCT urinalysis dipstick    Plan:  Continued routine obstetrical care, sonogram normal Pt denies any illicit or prescription drug abuse  No Follow-up on file.

## 2016-08-03 NOTE — Progress Notes (Signed)
US 18+4 wks,breech,cx 4.9 cm,ant pl gr 0,normal ovaries bilat,svp of fluid 5.2 cm,fhr 152 bpm,EFW 231 g,anatomy complete,no obvious abnormalities seen

## 2016-08-31 ENCOUNTER — Ambulatory Visit (INDEPENDENT_AMBULATORY_CARE_PROVIDER_SITE_OTHER): Payer: Medicaid Other | Admitting: Women's Health

## 2016-08-31 ENCOUNTER — Encounter: Payer: Self-pay | Admitting: Women's Health

## 2016-08-31 VITALS — BP 100/70 | HR 74 | Wt 225.8 lb

## 2016-08-31 DIAGNOSIS — O09522 Supervision of elderly multigravida, second trimester: Secondary | ICD-10-CM | POA: Diagnosis not present

## 2016-08-31 DIAGNOSIS — Z87898 Personal history of other specified conditions: Secondary | ICD-10-CM

## 2016-08-31 DIAGNOSIS — Z3A22 22 weeks gestation of pregnancy: Secondary | ICD-10-CM | POA: Diagnosis not present

## 2016-08-31 DIAGNOSIS — Z331 Pregnant state, incidental: Secondary | ICD-10-CM | POA: Diagnosis not present

## 2016-08-31 DIAGNOSIS — O09892 Supervision of other high risk pregnancies, second trimester: Secondary | ICD-10-CM | POA: Diagnosis not present

## 2016-08-31 DIAGNOSIS — Z1389 Encounter for screening for other disorder: Secondary | ICD-10-CM | POA: Diagnosis not present

## 2016-08-31 DIAGNOSIS — O99322 Drug use complicating pregnancy, second trimester: Secondary | ICD-10-CM

## 2016-08-31 DIAGNOSIS — O0992 Supervision of high risk pregnancy, unspecified, second trimester: Secondary | ICD-10-CM

## 2016-08-31 DIAGNOSIS — F1910 Other psychoactive substance abuse, uncomplicated: Secondary | ICD-10-CM

## 2016-08-31 DIAGNOSIS — O0990 Supervision of high risk pregnancy, unspecified, unspecified trimester: Secondary | ICD-10-CM

## 2016-08-31 DIAGNOSIS — F1991 Other psychoactive substance use, unspecified, in remission: Secondary | ICD-10-CM

## 2016-08-31 LAB — POCT URINALYSIS DIPSTICK
Glucose, UA: NEGATIVE
KETONES UA: NEGATIVE
Leukocytes, UA: NEGATIVE
Nitrite, UA: NEGATIVE
Protein, UA: NEGATIVE
RBC UA: NEGATIVE

## 2016-08-31 NOTE — Patient Instructions (Addendum)
You will have your sugar test next visit.  Please do not eat or drink anything after midnight the night before you come, not even water.  You will be here for at least two hours.     Call the office ([phone]) or go to [facility] if:  You begin to have strong, frequent contractions  Your water breaks.  Sometimes it is a big gush of fluid, sometimes it is just a trickle that keeps getting your panties wet or running down your legs  You have vaginal bleeding.  It is normal to have a small amount of spotting if your cervix was checked.   You don't feel your baby moving like normal.  If you don't, get you something to eat and drink and lay down and focus on feeling your baby move.   If your baby is still not moving like normal, you should call the office or go to [facility].  [place] Pediatricians/Family Doctors:  [facility] [phone]            [facility] [phone]                 [facility] [phone] (usually not accepting new patients unless you have family there already, you are always welcome to call and ask)            [place] Pediatricians/Family Doctors:   [facility]: [phone]  [facility]: [phone]    Second Trimester of Pregnancy The second trimester is from week 13 through week 28, months 4 through 6. The second trimester is often a time when you feel your best. Your body has also adjusted to being pregnant, and you begin to feel better physically. Usually, morning sickness has lessened or quit completely, you may have more energy, and you may have an increase in appetite. The second trimester is also a time when the fetus is growing rapidly. At the end of the sixth month, the fetus is about 9 inches long and weighs about 1 pounds. You will likely begin to feel the baby move (quickening) between 18 and 20 weeks of the pregnancy. BODY CHANGES Your body goes through many changes  during pregnancy. The changes vary from woman to woman.   Your weight will continue to increase. You will notice your lower abdomen bulging out.  You may begin to get stretch marks on your hips, abdomen, and breasts.  You may develop headaches that can be relieved by medicines approved by your health care provider.  You may urinate more often because the fetus is pressing on your bladder.  You may develop or continue to have heartburn as a result of your pregnancy.  You may develop constipation because certain hormones are causing the muscles that push waste through your intestines to slow down.  You may develop hemorrhoids or swollen, bulging veins (varicose veins).  You may have back pain because of the weight gain and pregnancy hormones relaxing your joints between the bones in your pelvis and as a result of a shift in weight and the muscles that support your balance.  Your breasts will continue to grow and be tender.  Your gums may bleed and may be sensitive to brushing and flossing.  Dark spots or blotches (chloasma, mask of pregnancy) may develop on your face. This will likely fade after the baby is born.  A dark line from your belly button to the pubic area (linea nigra) may appear. This will likely fade after the baby   is born.  You may have changes in your hair. These can include thickening of your hair, rapid growth, and changes in texture. Some women also have hair loss during or after pregnancy, or hair that feels dry or thin. Your hair will most likely return to normal after your baby is born. WHAT TO EXPECT AT YOUR PRENATAL VISITS During a routine prenatal visit:  You will be weighed to make sure you and the fetus are growing normally.  Your blood pressure will be taken.  Your abdomen will be measured to track your baby's growth.  The fetal heartbeat will be listened to.  Any test results from the previous visit will be discussed. Your health care provider may ask  you:  How you are feeling.  If you are feeling the baby move.  If you have had any abnormal symptoms, such as leaking fluid, bleeding, severe headaches, or abdominal cramping.  If you have any questions. Other tests that may be performed during your second trimester include:  Blood tests that check for:  Low iron levels (anemia).  Gestational diabetes (between 24 and 28 weeks).  Rh antibodies.  Urine tests to check for infections, diabetes, or protein in the urine.  An ultrasound to confirm the proper growth and development of the baby.  An amniocentesis to check for possible genetic problems.  Fetal screens for spina bifida and Down syndrome. HOME CARE INSTRUCTIONS   Avoid all smoking, herbs, alcohol, and unprescribed drugs. These chemicals affect the formation and growth of the baby.  Follow your health care provider's instructions regarding medicine use. There are medicines that are either safe or unsafe to take during pregnancy.  Exercise only as directed by your health care provider. Experiencing uterine cramps is a good sign to stop exercising.  Continue to eat regular, healthy meals.  Wear a good support bra for breast tenderness.  Do not use hot tubs, steam rooms, or saunas.  Wear your seat belt at all times when driving.  Avoid raw meat, uncooked cheese, cat litter boxes, and soil used by cats. These carry germs that can cause birth defects in the baby.  Take your prenatal vitamins.  Try taking a stool softener (if your health care provider approves) if you develop constipation. Eat more high-fiber foods, such as fresh vegetables or fruit and whole grains. Drink plenty of fluids to keep your urine clear or pale yellow.  Take warm sitz baths to soothe any pain or discomfort caused by hemorrhoids. Use hemorrhoid cream if your health care provider approves.  If you develop varicose veins, wear support hose. Elevate your feet for 15 minutes, 3-4 times a day.  Limit salt in your diet.  Avoid heavy lifting, wear low heel shoes, and practice good posture.  Rest with your legs elevated if you have leg cramps or low back pain.  Visit your dentist if you have not gone yet during your pregnancy. Use a soft toothbrush to brush your teeth and be gentle when you floss.  A sexual relationship may be continued unless your health care provider directs you otherwise.  Continue to go to all your prenatal visits as directed by your health care provider. SEEK MEDICAL CARE IF:   You have dizziness.  You have mild pelvic cramps, pelvic pressure, or nagging pain in the abdominal area.  You have persistent nausea, vomiting, or diarrhea.  You have a bad smelling vaginal discharge.  You have pain with urination. SEEK IMMEDIATE MEDICAL CARE IF:   You have   a fever.  You are leaking fluid from your vagina.  You have spotting or bleeding from your vagina.  You have severe abdominal cramping or pain.  You have rapid weight gain or loss.  You have shortness of breath with chest pain.  You notice sudden or extreme swelling of your face, hands, ankles, feet, or legs.  You have not felt your baby move in over an hour.  You have severe headaches that do not go away with medicine.  You have vision changes. Document Released: 01/19/2001 Document Revised: 01/30/2013 Document Reviewed: 03/28/2012 [employer] Patient Information 2015 [employer]. This information is not intended to replace advice given to you by your health care provider. Make sure you discuss any questions you have with your health care provider.     

## 2016-08-31 NOTE — Progress Notes (Signed)
High Risk Pregnancy Diagnosis(es): AMA, h/o polysubstance abuse [id number] Estimated Date of Delivery: 12/31/16 BP 100/70   Pulse 74   Wt 225 lb 12.8 oz (102.4 kg)   LMP  (LMP Unknown)   BMI 38.76 kg/m   Urinalysis: Negative HPI:  Doing well, no complaints.  BP, weight, and urine reviewed.  Reports good fm. Denies regular uc's, lof, vb, uti s/s. No complaints.  Fundal Height:  23 Fetal Heart rate:  150 Edema:  none  Reviewed ptl s/s, fm All questions were answered Assessment: 4044w4d AMA, h/o polysubstance abuse Medication(s) Plans:  Baby asa daily Treatment Plan:  U/S @ 24, 28, 32, 36wks    2x/wk testing nst/sono @ 36wks    Deliver @ 40wks Follow up in 4wks for high-risk OB appt, pn2, and efw/afi us/

## 2016-09-01 LAB — MED LIST OPTION NOT SELECTED

## 2016-09-03 LAB — PMP SCREEN PROFILE (10S), URINE
Amphetamine Scrn, Ur: NEGATIVE ng/mL
BARBITURATE SCREEN URINE: NEGATIVE ng/mL
BENZODIAZEPINE SCREEN, URINE: NEGATIVE ng/mL
CANNABINOIDS UR QL SCN: NEGATIVE ng/mL
Cocaine (Metab) Scrn, Ur: NEGATIVE ng/mL
Creatinine(Crt), U: 51.1 mg/dL (ref 20.0–300.0)
Methadone Screen, Urine: NEGATIVE ng/mL
OXYCODONE+OXYMORPHONE UR QL SCN: NEGATIVE ng/mL
Opiate Scrn, Ur: NEGATIVE ng/mL
PH UR, DRUG SCRN: 6.2 (ref 4.5–8.9)
Phencyclidine Qn, Ur: NEGATIVE ng/mL
Propoxyphene Scrn, Ur: NEGATIVE ng/mL

## 2016-09-28 ENCOUNTER — Ambulatory Visit (INDEPENDENT_AMBULATORY_CARE_PROVIDER_SITE_OTHER): Payer: Medicaid Other

## 2016-09-28 ENCOUNTER — Encounter: Payer: Self-pay | Admitting: Women's Health

## 2016-09-28 ENCOUNTER — Ambulatory Visit (INDEPENDENT_AMBULATORY_CARE_PROVIDER_SITE_OTHER): Payer: Medicaid Other | Admitting: Women's Health

## 2016-09-28 ENCOUNTER — Other Ambulatory Visit: Payer: Medicaid Other

## 2016-09-28 VITALS — BP 90/50 | HR 94 | Wt 227.0 lb

## 2016-09-28 DIAGNOSIS — Z3A26 26 weeks gestation of pregnancy: Secondary | ICD-10-CM | POA: Diagnosis not present

## 2016-09-28 DIAGNOSIS — O09523 Supervision of elderly multigravida, third trimester: Secondary | ICD-10-CM

## 2016-09-28 DIAGNOSIS — O09522 Supervision of elderly multigravida, second trimester: Secondary | ICD-10-CM | POA: Diagnosis not present

## 2016-09-28 DIAGNOSIS — Z1389 Encounter for screening for other disorder: Secondary | ICD-10-CM | POA: Diagnosis not present

## 2016-09-28 DIAGNOSIS — O1212 Gestational proteinuria, second trimester: Secondary | ICD-10-CM | POA: Diagnosis not present

## 2016-09-28 DIAGNOSIS — O09892 Supervision of other high risk pregnancies, second trimester: Secondary | ICD-10-CM

## 2016-09-28 DIAGNOSIS — Z331 Pregnant state, incidental: Secondary | ICD-10-CM | POA: Diagnosis not present

## 2016-09-28 DIAGNOSIS — O0990 Supervision of high risk pregnancy, unspecified, unspecified trimester: Secondary | ICD-10-CM

## 2016-09-28 DIAGNOSIS — O0992 Supervision of high risk pregnancy, unspecified, second trimester: Secondary | ICD-10-CM

## 2016-09-28 DIAGNOSIS — Z131 Encounter for screening for diabetes mellitus: Secondary | ICD-10-CM

## 2016-09-28 LAB — POCT URINALYSIS DIPSTICK
Blood, UA: NEGATIVE
GLUCOSE UA: NEGATIVE
Ketones, UA: NEGATIVE
LEUKOCYTES UA: NEGATIVE
NITRITE UA: NEGATIVE

## 2016-09-28 NOTE — Progress Notes (Signed)
High Risk Pregnancy Diagnosis(es): AMA, h/o polysubstance abuse [id number] [redacted]w[redacted]d Estimated Date of Delivery: 12/31/16 BP (!) 90/50   Pulse 94   Wt 227 lb (103 kg)   LMP  (LMP Unknown)   BMI 38.96 kg/m   Urinalysis: Positive for tr protein HPI:  Seems down today, reports she's been under a lot of stress lately. Denies recent drug use. Last 3 drug screens have all been neg.  BP, weight, and urine reviewed.  Reports good fm. Denies regular uc's, lof, vb, uti s/s. No complaints. Still contemplating BTL, is concerned about spinal anethesia, discussed further and gave printed info. Doesn't get epidurals for births. Also discussed LARCs.   Fundal Height:  26 Fetal Heart rate:  146 u/s Edema:  none  Reviewed today's u/s: efw 57%, svp 7.7cm, breech. Discussed ptl s/s, fkc. Recommended Tdap at HD/PCP per CDC guidelines.  All questions were answered Assessment: [redacted]w[redacted]d AMA, h/o polysubstance abuse Medication(s) Plans:  Baby asa Treatment Plan:  Growth U/S q 4wks,  2x/wk testing nst/sono @ 36wks    Deliver @ 40wks Follow up in 4wks for high-risk OB appt and efw/afi u/s PN2 today

## 2016-09-28 NOTE — Progress Notes (Signed)
[place] 26+4 wks,breech,cx 3.9 cm,anterior pl gr 0,normal ovaries bilat,svp of fluid 7.7 cm,fhr 146 bpm,efw 1012 g 57%

## 2016-09-28 NOTE — Patient Instructions (Addendum)
Call the office ([phone]) or go to [facility] if:  You begin to have strong, frequent contractions  Your water breaks.  Sometimes it is a big gush of fluid, sometimes it is just a trickle that keeps getting your panties wet or running down your legs  You have vaginal bleeding.  It is normal to have a small amount of spotting if your cervix was checked.   You don't feel your baby moving like normal.  If you don't, get you something to eat and drink and lay down and focus on feeling your baby move.  You should feel at least 10 movements in 2 hours.  If you don't, you should call the office or go to [facility].    Tdap Vaccine  It is recommended that you get the Tdap vaccine during the third trimester of EACH pregnancy to help protect your baby from getting pertussis (whooping cough)  27-36 weeks is the BEST time to do this so that you can pass the protection on to your baby. During pregnancy is better than after pregnancy, but if you are unable to get it during pregnancy it will be offered at the hospital.   You can get this vaccine at the health department or your family doctor  Everyone who will be around your baby should also be up-to-date on their vaccines. Adults (who are not pregnant) only need 1 dose of Tdap during adulthood.   Third Trimester of Pregnancy The third trimester is from week 29 through week 42, months 7 through 9. The third trimester is a time when the fetus is growing rapidly. At the end of the ninth month, the fetus is about 20 inches in length and weighs 6-10 pounds.  BODY CHANGES Your body goes through many changes during pregnancy. The changes vary from woman to woman.   Your weight will continue to increase. You can expect to gain 25-35 pounds (11-16 kg) by the end of the pregnancy.  You may begin to get stretch marks on your hips, abdomen, and breasts.  You may urinate more often because the fetus is moving lower into your pelvis and pressing on  your bladder.  You may develop or continue to have heartburn as a result of your pregnancy.  You may develop constipation because certain hormones are causing the muscles that push waste through your intestines to slow down.  You may develop hemorrhoids or swollen, bulging veins (varicose veins).  You may have pelvic pain because of the weight gain and pregnancy hormones relaxing your joints between the bones in your pelvis. Backaches may result from overexertion of the muscles supporting your posture.  You may have changes in your hair. These can include thickening of your hair, rapid growth, and changes in texture. Some women also have hair loss during or after pregnancy, or hair that feels dry or thin. Your hair will most likely return to normal after your baby is born.  Your breasts will continue to grow and be tender. A yellow discharge may leak from your breasts called colostrum.  Your belly button may stick out.  You may feel short of breath because of your expanding uterus.  You may notice the fetus "dropping," or moving lower in your abdomen.  You may have a bloody mucus discharge. This usually occurs a few days to a week before labor begins.  Your cervix becomes thin and soft (effaced) near your due date. WHAT TO EXPECT AT YOUR PRENATAL EXAMS  You will have prenatal   exams every 2 weeks until week 36. Then, you will have weekly prenatal exams. During a routine prenatal visit:  You will be weighed to make sure you and the fetus are growing normally.  Your blood pressure is taken.  Your abdomen will be measured to track your baby's growth.  The fetal heartbeat will be listened to.  Any test results from the previous visit will be discussed.  You may have a cervical check near your due date to see if you have effaced. At around 36 weeks, your caregiver will check your cervix. At the same time, your caregiver will also perform a test on the secretions of the vaginal tissue.  This test is to determine if a type of bacteria, Group B streptococcus, is present. Your caregiver will explain this further. Your caregiver may ask you:  What your birth plan is.  How you are feeling.  If you are feeling the baby move.  If you have had any abnormal symptoms, such as leaking fluid, bleeding, severe headaches, or abdominal cramping.  If you have any questions. Other tests or screenings that may be performed during your third trimester include:  Blood tests that check for low iron levels (anemia).  Fetal testing to check the health, activity level, and growth of the fetus. Testing is done if you have certain medical conditions or if there are problems during the pregnancy. FALSE LABOR You may feel small, irregular contractions that eventually go away. These are called Braxton Hicks contractions, or false labor. Contractions may last for hours, days, or even weeks before true labor sets in. If contractions come at regular intervals, intensify, or become painful, it is best to be seen by your caregiver.  SIGNS OF LABOR   Menstrual-like cramps.  Contractions that are 5 minutes apart or less.  Contractions that start on the top of the uterus and spread down to the lower abdomen and back.  A sense of increased pelvic pressure or back pain.  A watery or bloody mucus discharge that comes from the vagina. If you have any of these signs before the 37th week of pregnancy, call your caregiver right away. You need to go to the hospital to get checked immediately. HOME CARE INSTRUCTIONS   Avoid all smoking, herbs, alcohol, and unprescribed drugs. These chemicals affect the formation and growth of the baby.  Follow your caregiver's instructions regarding medicine use. There are medicines that are either safe or unsafe to take during pregnancy.  Exercise only as directed by your caregiver. Experiencing uterine cramps is a good sign to stop exercising.  Continue to eat regular,  healthy meals.  Wear a good support bra for breast tenderness.  Do not use hot tubs, steam rooms, or saunas.  Wear your seat belt at all times when driving.  Avoid raw meat, uncooked cheese, cat litter boxes, and soil used by cats. These carry germs that can cause birth defects in the baby.  Take your prenatal vitamins.  Try taking a stool softener (if your caregiver approves) if you develop constipation. Eat more high-fiber foods, such as fresh vegetables or fruit and whole grains. Drink plenty of fluids to keep your urine clear or pale yellow.  Take warm sitz baths to soothe any pain or discomfort caused by hemorrhoids. Use hemorrhoid cream if your caregiver approves.  If you develop varicose veins, wear support hose. Elevate your feet for 15 minutes, 3-4 times a day. Limit salt in your diet.  Avoid heavy lifting, wear low heal   shoes, and practice good posture.  Rest a lot with your legs elevated if you have leg cramps or low back pain.  Visit your dentist if you have not gone during your pregnancy. Use a soft toothbrush to brush your teeth and be gentle when you floss.  A sexual relationship may be continued unless your caregiver directs you otherwise.  Do not travel far distances unless it is absolutely necessary and only with the approval of your caregiver.  Take prenatal classes to understand, practice, and ask questions about the labor and delivery.  Make a trial run to the hospital.  Pack your hospital bag.  Prepare the baby's nursery.  Continue to go to all your prenatal visits as directed by your caregiver. SEEK MEDICAL CARE IF:  You are unsure if you are in labor or if your water has broken.  You have dizziness.  You have mild pelvic cramps, pelvic pressure, or nagging pain in your abdominal area.  You have persistent nausea, vomiting, or diarrhea.  You have a bad smelling vaginal discharge.  You have pain with urination. SEEK IMMEDIATE MEDICAL CARE IF:    You have a fever.  You are leaking fluid from your vagina.  You have spotting or bleeding from your vagina.  You have severe abdominal cramping or pain.  You have rapid weight loss or gain.  You have shortness of breath with chest pain.  You notice sudden or extreme swelling of your face, hands, ankles, feet, or legs.  You have not felt your baby move in over an hour.  You have severe headaches that do not go away with medicine.  You have vision changes. Document Released: 01/19/2001 Document Revised: 01/30/2013 Document Reviewed: 03/28/2012 [facility] Patient Information 2015 [place], [place]. This information is not intended to replace advice given to you by your health care provider. Make sure you discuss any questions you have with your health care provider.   Regional Anesthesia Regional anesthesia is a type of medicine that temporarily blocks feeling in one area of your body. You may have regional anesthesia before a medical procedure or surgery. A health care provider who specializes in giving anesthesia (anesthesiologist) injects the medicine near a nerve or a group of nerves. This makes that area of the body numb. Regional anesthesia can be used alone or in combination with other types of anesthesia. Regional anesthesia has many benefits compared to using general anesthesia on its own, such as:  Improved pain control after your surgery.  Less nausea, vomiting, or drowsiness after surgery. This is because less medicine is needed to control pain.  A faster recovery.  There are three types of regional anesthesia:  Spinal anesthesia. This is a one-time injection of medicine into the fluid that surrounds your spinal cord. This numbs the area below and slightly above the injection site.  Epidural anesthesia. This is another medicine that may be placed into your back, but just outside of the protective tissue that covers your spinal cord. Instead of a one-time injection, the  medicine is often given gradually over time through a small tube (catheter) that remains in your back for as long as pain control is needed.  Peripheral nerve block. This is an injection that is given in an area of the body other than the spine to block all sensation below the injection site. Examples include: ? Peripheral block in the shoulder, which numbs the arm and hand. ? Peripheral block at the base of a finger, which numbs the whole finger.  Spinal and peripheral nerve blocks may be given as a single injection before your procedure, or you may also have a continuous infusion for as long as you need it. Tell a health care provider about:  Any allergies you have.  All medicines you are taking, including vitamins, herbs, eye drops, creams, and over-the-counter medicines.  Any use of illegal drugs, alcohol, and tobacco.  Any problems you or family members have had with anesthetic medicines.  Any blood disorders you have.  Any surgeries you have had.  Any medical conditions you have or have had, especially heart failure, chronic obstructive pulmonary disease (COPD), or sleep apnea.  Whether you are pregnant or may be pregnant. What are the risks? Generally, this is a safe procedure. However, problems may occur, including:  Pain.  Nausea.  Itching.  Low blood pressure.  Headache.  Nerve damage.  Infection.  Bleeding around the injection site.  Trouble urinating.  Allergic reactions to medicine.  What happens before the procedure?  Follow instructions from your health care provider about eating or drinking restrictions. You may be asked to stop eating food and stop drinking unclear liquids at least 8 hours before your procedure. Sometimes, clear liquids are allowed until 2-4 hours before surgery to keep you hydrated. Clear liquids include water, clear fruit juice, black coffee, and plain tea.  Ask your health care provider about changing or stopping your regular  medicines. This is especially important if you are taking diabetes medicines or blood thinners.  Plan to have someone take you home after the procedure.  If you will be going home right after the procedure, plan to have someone with you for 24 hours.  You may need to have blood or imaging tests. What happens during the procedure?  To reduce your risk of infection: ? Your health care team will wash or sanitize their hands. ? Your skin will be washed with soap.  Depending on the medical procedure you are having done, an IV tube will be inserted into one of your veins.  The anesthesiologist will do a physical exam to find the best location to give the regional anesthesia. A device (nerve stimulator) or imaging tool (ultrasound) may be used to help locate the nerve. This helps prevent nerve damage.  You may be given a medicine to help you relax (sedative).  You will get regional anesthesia by injection or through a continuous infusion.  The anesthesiologist will check to make sure the medicine is working before the rest of your medical procedure begins.  Depending on the type of regional anesthesia you received, you may have a small bandage (dressing) placed over the injection site. The procedure may vary among health care providers and hospitals. What happens after the procedure?  Your blood pressure, heart rate, breathing rate, and blood oxygen level will be monitored often until the medicines you were given have worn off.  Do not drive for 24 hours if you received a sedative. This information is not intended to replace advice given to you by your health care provider. Make sure you discuss any questions you have with your health care provider. Document Released: 05/19/2015 Document Revised: 07/03/2015 Document Reviewed: 05/22/2014 Elsevier Interactive Patient Education  [employer].

## 2016-09-29 LAB — CBC
HEMATOCRIT: 34 % (ref 34.0–46.6)
HEMOGLOBIN: 11.2 g/dL (ref 11.1–15.9)
MCH: 32.1 pg (ref 26.6–33.0)
MCHC: 32.9 g/dL (ref 31.5–35.7)
MCV: 97 fL (ref 79–97)
Platelets: 254 10*3/uL (ref 150–379)
RBC: 3.49 x10E6/uL — AB (ref 3.77–5.28)
RDW: 14.3 % (ref 12.3–15.4)
WBC: 11.6 10*3/uL — ABNORMAL HIGH (ref 3.4–10.8)

## 2016-09-29 LAB — GLUCOSE TOLERANCE, 2 HOURS W/ 1HR
GLUCOSE, 1 HOUR: 118 mg/dL (ref 65–179)
GLUCOSE, 2 HOUR: 50 mg/dL — AB (ref 65–152)
GLUCOSE, FASTING: 82 mg/dL (ref 65–91)

## 2016-09-29 LAB — RPR: RPR: NONREACTIVE

## 2016-09-29 LAB — ANTIBODY SCREEN: ANTIBODY SCREEN: NEGATIVE

## 2016-09-29 LAB — HIV ANTIBODY (ROUTINE TESTING W REFLEX): HIV SCREEN 4TH GENERATION: NONREACTIVE

## 2016-10-26 ENCOUNTER — Encounter: Payer: Self-pay | Admitting: Obstetrics & Gynecology

## 2016-10-26 ENCOUNTER — Ambulatory Visit (INDEPENDENT_AMBULATORY_CARE_PROVIDER_SITE_OTHER): Payer: Medicaid Other | Admitting: Obstetrics & Gynecology

## 2016-10-26 ENCOUNTER — Ambulatory Visit (INDEPENDENT_AMBULATORY_CARE_PROVIDER_SITE_OTHER): Payer: Medicaid Other

## 2016-10-26 VITALS — BP 112/72 | HR 88 | Wt 237.0 lb

## 2016-10-26 DIAGNOSIS — O09522 Supervision of elderly multigravida, second trimester: Secondary | ICD-10-CM

## 2016-10-26 DIAGNOSIS — Z1389 Encounter for screening for other disorder: Secondary | ICD-10-CM

## 2016-10-26 DIAGNOSIS — O09523 Supervision of elderly multigravida, third trimester: Secondary | ICD-10-CM

## 2016-10-26 DIAGNOSIS — Z331 Pregnant state, incidental: Secondary | ICD-10-CM | POA: Diagnosis not present

## 2016-10-26 DIAGNOSIS — Z3A30 30 weeks gestation of pregnancy: Secondary | ICD-10-CM

## 2016-10-26 DIAGNOSIS — O0992 Supervision of high risk pregnancy, unspecified, second trimester: Secondary | ICD-10-CM

## 2016-10-26 LAB — POCT URINALYSIS DIPSTICK
Blood, UA: NEGATIVE
Glucose, UA: NEGATIVE
KETONES UA: NEGATIVE
LEUKOCYTES UA: NEGATIVE
Nitrite, UA: NEGATIVE
PROTEIN UA: NEGATIVE

## 2016-10-26 NOTE — Progress Notes (Signed)
[id number] [redacted]w[redacted]d Estimated Date of Delivery: 12/31/16  Blood pressure 112/72, pulse 88, weight 237 lb (107.5 kg).   BP weight and urine results all reviewed and noted.  Please refer to the obstetrical flow sheet for the fundal height and fetal heart rate documentation:  Patient reports good fetal movement, denies any bleeding and no rupture of membranes symptoms or regular contractions. Patient is without complaints. All questions were answered.  Orders Placed This Encounter  Procedures  . POCT Urinalysis Dipstick    Plan:  Continued routine obstetrical care, sonogram is normal with EFW 59%  Return in about 2 weeks (around 11/09/2016) for LROB.

## 2016-10-26 NOTE — Progress Notes (Signed)
[place] 30+4 wks,cephalic,cx 3.2 cm,ant pl gr 1,bilat adnexa's wnl,afi 16 cm,fhr 133 bpm,efw 1724 g 59%

## 2016-11-02 ENCOUNTER — Telehealth: Payer: Self-pay | Admitting: *Deleted

## 2016-11-05 NOTE — Telephone Encounter (Signed)
Informed patient that she could take 0.5 to 1 tablet of Benadryl, Unisom or Tylenol PM only when needed for sleep. Encouraged not to drink caffeine 3 hours before bed. Informed that difficulty sleeping may not get better, the body sometimes is just getting "prepared" for a newborn. Verbalized understanding.

## 2016-11-09 ENCOUNTER — Encounter: Payer: Self-pay | Admitting: Advanced Practice Midwife

## 2016-11-09 ENCOUNTER — Ambulatory Visit (INDEPENDENT_AMBULATORY_CARE_PROVIDER_SITE_OTHER): Payer: Medicaid Other | Admitting: Advanced Practice Midwife

## 2016-11-09 VITALS — BP 120/68 | HR 96 | Wt 236.5 lb

## 2016-11-09 DIAGNOSIS — O99343 Other mental disorders complicating pregnancy, third trimester: Secondary | ICD-10-CM | POA: Diagnosis not present

## 2016-11-09 DIAGNOSIS — O99323 Drug use complicating pregnancy, third trimester: Secondary | ICD-10-CM | POA: Diagnosis not present

## 2016-11-09 DIAGNOSIS — O09523 Supervision of elderly multigravida, third trimester: Secondary | ICD-10-CM

## 2016-11-09 DIAGNOSIS — Z3A32 32 weeks gestation of pregnancy: Secondary | ICD-10-CM | POA: Diagnosis not present

## 2016-11-09 DIAGNOSIS — Z331 Pregnant state, incidental: Secondary | ICD-10-CM | POA: Diagnosis not present

## 2016-11-09 DIAGNOSIS — Z1389 Encounter for screening for other disorder: Secondary | ICD-10-CM | POA: Diagnosis not present

## 2016-11-09 DIAGNOSIS — O0993 Supervision of high risk pregnancy, unspecified, third trimester: Secondary | ICD-10-CM

## 2016-11-09 DIAGNOSIS — F1910 Other psychoactive substance abuse, uncomplicated: Secondary | ICD-10-CM | POA: Diagnosis not present

## 2016-11-09 DIAGNOSIS — F3181 Bipolar II disorder: Secondary | ICD-10-CM

## 2016-11-09 LAB — POCT URINALYSIS DIPSTICK
Blood, UA: NEGATIVE
GLUCOSE UA: NEGATIVE
KETONES UA: NEGATIVE
LEUKOCYTES UA: NEGATIVE
Nitrite, UA: NEGATIVE
Protein, UA: NEGATIVE

## 2016-11-09 MED ORDER — ONDANSETRON 4 MG PO TBDP: 4.0000 mg | ORAL_TABLET | Freq: Four times a day (QID) | ORAL | 1 refills | Status: DC | PRN

## 2016-11-09 NOTE — Progress Notes (Signed)
Fetal Surveillance Testing today:  doppler   High Risk Pregnancy Diagnosis(es):   AMA, hx polysubstance abuse  [id number] [redacted]w[redacted]d Estimated Date of Delivery: 12/31/16  Blood pressure 120/68, pulse 96, weight 236 lb 8 oz (107.3 kg).  Urinalysis: Negative   HPI: The patient is being seen today for ongoing management of the above.  Reports no drugs. Today she reports started having N&V last night  No lower GI sx. Still wishy-washy about BTL, so won't do it.    BP weight and urine results all reviewed and noted. Patient reports good fetal movement, denies any bleeding and no rupture of membranes symptoms or regular contractions.  Fundal Height:  32 Fetal Heart rate:  140    Edema:  no  Patient is without complaints other than noted in her HPI. All questions were answered.  All lab and sonogram results have been reviewed. Comments:  normal  Assessment:  1.  Pregnancy at [redacted]w[redacted]d,  Estimated Date of Delivery: 12/31/16 :  AMA                        2.  Hx polysubstance use, none since early pg per pt, all UDS neg                        3.    Medication(s) Plans:  ASA   Treatment Plan:  Growth [place] q 4 weeks, IOL 40 weeks  Return in about 2 weeks (around 11/23/2016) for HROB, US:EFW. for appointment for high risk OB care  Meds ordered this encounter  Medications  . ondansetron (ZOFRAN-ODT) 4 MG disintegrating tablet    Sig: Take 1 tablet (4 mg total) by mouth every 6 (six) hours as needed for nausea or vomiting.    Dispense:  30 tablet    Refill:  1    Order Specific Question:   Supervising Provider    Answer:   Duane Lope H [[id number]]   Orders Placed This Encounter  Procedures  . US OB Follow Up  . Pain Management Screening Profile (10S)  . POCT urinalysis dipstick

## 2016-11-09 NOTE — Patient Instructions (Signed)

## 2016-11-10 LAB — PMP SCREEN PROFILE (10S), URINE
Amphetamine Scrn, Ur: NEGATIVE ng/mL
BARBITURATE SCREEN URINE: NEGATIVE ng/mL
BENZODIAZEPINE SCREEN, URINE: NEGATIVE ng/mL
CANNABINOIDS UR QL SCN: NEGATIVE ng/mL
COCAINE(METAB.)SCREEN, URINE: NEGATIVE ng/mL
CREATININE(CRT), U: 90.8 mg/dL (ref 20.0–300.0)
METHADONE SCREEN, URINE: NEGATIVE ng/mL
OPIATE SCREEN URINE: NEGATIVE ng/mL
OXYCODONE+OXYMORPHONE UR QL SCN: NEGATIVE ng/mL
PROPOXYPHENE SCREEN URINE: NEGATIVE ng/mL
Ph of Urine: 6.8 (ref 4.5–8.9)
Phencyclidine Qn, Ur: NEGATIVE ng/mL

## 2016-11-10 LAB — MED LIST OPTION NOT SELECTED

## 2016-11-23 ENCOUNTER — Ambulatory Visit (INDEPENDENT_AMBULATORY_CARE_PROVIDER_SITE_OTHER): Payer: Medicaid Other

## 2016-11-23 ENCOUNTER — Encounter: Payer: Self-pay | Admitting: Obstetrics & Gynecology

## 2016-11-23 ENCOUNTER — Ambulatory Visit (INDEPENDENT_AMBULATORY_CARE_PROVIDER_SITE_OTHER): Payer: Medicaid Other | Admitting: Obstetrics & Gynecology

## 2016-11-23 VITALS — BP 108/60 | HR 92 | Wt 239.0 lb

## 2016-11-23 DIAGNOSIS — Z3A34 34 weeks gestation of pregnancy: Secondary | ICD-10-CM | POA: Diagnosis not present

## 2016-11-23 DIAGNOSIS — O09523 Supervision of elderly multigravida, third trimester: Secondary | ICD-10-CM

## 2016-11-23 DIAGNOSIS — Z1389 Encounter for screening for other disorder: Secondary | ICD-10-CM

## 2016-11-23 DIAGNOSIS — Z331 Pregnant state, incidental: Secondary | ICD-10-CM | POA: Diagnosis not present

## 2016-11-23 DIAGNOSIS — O0993 Supervision of high risk pregnancy, unspecified, third trimester: Secondary | ICD-10-CM

## 2016-11-23 LAB — POCT URINALYSIS DIPSTICK
Glucose, UA: NEGATIVE
KETONES UA: NEGATIVE
Leukocytes, UA: NEGATIVE
Nitrite, UA: NEGATIVE
PROTEIN UA: 1
RBC UA: NEGATIVE

## 2016-11-23 NOTE — Progress Notes (Signed)
Fetal Surveillance Testing today:  Sonogram FHR 144   High Risk Pregnancy Diagnosis(es):   AMA > 40 years old  Z6X0960 [redacted]w[redacted]d Estimated Date of Delivery: 12/31/16  Blood pressure 108/60, pulse 92, weight 239 lb (108.4 kg).  Urinalysis: 1+ protein with normal BP   HPI: The patient is being seen today for ongoing management of as above. Today she reports would like note to allow excuse from school   BP weight and urine results all reviewed and noted. Patient reports good fetal movement, denies any bleeding and no rupture of membranes symptoms or regular contractions.  Fundal Height:  37 Fetal Heart rate:  144 Edema:  none  Patient is without complaints other than noted in her HPI. All questions were answered.  All lab and sonogram results have been reviewed. Comments:    Assessment:  1.  Pregnancy at [redacted]w[redacted]d,  Estimated Date of Delivery: 12/31/16 :                          2.  AMA > 40 y/o                        3.    Medication(s) Plans:  Baby ASA  Treatment Plan:  Induction 40 weeks  Return in about 1 week (around 11/30/2016) for HROB. for appointment for high risk OB care  No orders of the defined types were placed in this encounter.  Orders Placed This Encounter  Procedures  . POCT urinalysis dipstick

## 2016-11-23 NOTE — Progress Notes (Signed)
[place] 34+4 wks,cephalic,ant pl gr 1,fhr 144 bpm,afi 10.8 cm,EFW 2517 g 49%

## 2016-11-30 ENCOUNTER — Ambulatory Visit (INDEPENDENT_AMBULATORY_CARE_PROVIDER_SITE_OTHER): Payer: Medicaid Other | Admitting: Advanced Practice Midwife

## 2016-11-30 ENCOUNTER — Encounter: Payer: Self-pay | Admitting: Advanced Practice Midwife

## 2016-11-30 VITALS — BP 120/60 | HR 103 | Wt 237.5 lb

## 2016-11-30 DIAGNOSIS — R3129 Other microscopic hematuria: Secondary | ICD-10-CM | POA: Diagnosis not present

## 2016-11-30 DIAGNOSIS — Z331 Pregnant state, incidental: Secondary | ICD-10-CM

## 2016-11-30 DIAGNOSIS — Z3A35 35 weeks gestation of pregnancy: Secondary | ICD-10-CM | POA: Diagnosis not present

## 2016-11-30 DIAGNOSIS — Z23 Encounter for immunization: Secondary | ICD-10-CM

## 2016-11-30 DIAGNOSIS — O0993 Supervision of high risk pregnancy, unspecified, third trimester: Secondary | ICD-10-CM

## 2016-11-30 DIAGNOSIS — O288 Other abnormal findings on antenatal screening of mother: Secondary | ICD-10-CM

## 2016-11-30 DIAGNOSIS — O0990 Supervision of high risk pregnancy, unspecified, unspecified trimester: Secondary | ICD-10-CM

## 2016-11-30 DIAGNOSIS — O09523 Supervision of elderly multigravida, third trimester: Secondary | ICD-10-CM

## 2016-11-30 DIAGNOSIS — Z1389 Encounter for screening for other disorder: Secondary | ICD-10-CM

## 2016-11-30 LAB — POCT URINALYSIS DIPSTICK
GLUCOSE UA: NEGATIVE
Ketones, UA: NEGATIVE
Nitrite, UA: NEGATIVE

## 2016-11-30 NOTE — Patient Instructions (Signed)

## 2016-11-30 NOTE — Progress Notes (Signed)
Fetal Surveillance Testing today:  doppler   High Risk Pregnancy Diagnosis(es):   AMA  [id number] 314w4d Estimated Date of Delivery: 12/31/16  Blood pressure 120/60, pulse (!) 103, weight 237 lb 8 oz (107.7 kg).  Urinalysis: Positive for leuks tr protein and small blood.  Denies UTI sx   HPI: The patient is being seen today for ongoing management of the above. Today she reports no co   BP weight and urine results all reviewed and noted. Patient reports good fetal movement, denies any bleeding and no rupture of membranes symptoms or regular contractions.  Fundal  Height:  37 Fetal Heart rate:  140 Edema:  no  Patient is without complaints other than noted in her HPI. All questions were answered.  All lab and sonogram results have been reviewed. Comments:    Assessment:  1.  Pregnancy at 3314w4d,  Estimated Date of Delivery: 12/31/16 :  AMA                        2.                          3.    Medication(s) Plans:  ASA 81mg   Treatment Plan:  IOL 40 weeks, culture urine  Return in about 1 week (around 12/07/2016) for LROB, HROB. for appointment for high risk OB care  No orders of the defined types were placed in this encounter.  Orders Placed This Encounter  Procedures  . Urine Culture  . Flu Vaccine QUAD 36+ mos IM  . POCT urinalysis dipstick

## 2016-12-02 LAB — URINE CULTURE

## 2016-12-07 ENCOUNTER — Encounter: Payer: Self-pay | Admitting: Women's Health

## 2016-12-07 ENCOUNTER — Ambulatory Visit (INDEPENDENT_AMBULATORY_CARE_PROVIDER_SITE_OTHER): Payer: Medicaid Other | Admitting: Women's Health

## 2016-12-07 ENCOUNTER — Other Ambulatory Visit (INDEPENDENT_AMBULATORY_CARE_PROVIDER_SITE_OTHER): Payer: Medicaid Other

## 2016-12-07 VITALS — BP 114/66 | HR 102 | Wt 237.0 lb

## 2016-12-07 DIAGNOSIS — Z1389 Encounter for screening for other disorder: Secondary | ICD-10-CM | POA: Diagnosis not present

## 2016-12-07 DIAGNOSIS — Z3A36 36 weeks gestation of pregnancy: Secondary | ICD-10-CM

## 2016-12-07 DIAGNOSIS — Z331 Pregnant state, incidental: Secondary | ICD-10-CM

## 2016-12-07 DIAGNOSIS — O09523 Supervision of elderly multigravida, third trimester: Secondary | ICD-10-CM | POA: Diagnosis not present

## 2016-12-07 DIAGNOSIS — O0993 Supervision of high risk pregnancy, unspecified, third trimester: Secondary | ICD-10-CM

## 2016-12-07 LAB — POCT URINALYSIS DIPSTICK
Glucose, UA: NEGATIVE
KETONES UA: NEGATIVE
Leukocytes, UA: NEGATIVE
NITRITE UA: NEGATIVE
Protein, UA: NEGATIVE
RBC UA: NEGATIVE

## 2016-12-07 LAB — OB RESULTS CONSOLE GBS: GBS: NEGATIVE

## 2016-12-07 NOTE — Progress Notes (Signed)
   HIGH-RISK PREGNANCY VISIT Patient name: Michelle Payne MRN [id number]  Date of birth: [birth year] Chief Complaint:   Routine Prenatal Visit  History of Present Illness:   Michelle Payne is a 41 y.o. [id number] female at 4977w4d with an Estimated Date of Delivery: 12/31/16 being seen today for ongoing management of a high-risk pregnancy complicated by AMA 41yo.  Today she reports stressed lately, FOB has new girlfriend. Cries a lot. Denies SI/HI. Hasn't resumed illicit drugs. Declines need for counseling/therapy.  Contractions: Not present. Vag. Bleeding: None.  Movement: Present. denies leaking of fluid.  Review of Systems:   Pertinent items are noted in HPI Denies abnormal vaginal discharge w/ itching/odor/irritation, headaches, visual changes, shortness of breath, chest pain, abdominal pain, severe nausea/vomiting, or problems with urination or bowel movements unless otherwise stated above. Pertinent History Reviewed:  Reviewed past medical,surgical, social, obstetrical and family history.  Reviewed problem list, medications and allergies. Physical Assessment:   Vitals:   12/07/16 1429  BP: 114/66  Pulse: (!) 102  Weight: 237 lb (107.5 kg)  Body mass index is 40.68 kg/m.           Physical Examination:   General appearance: alert, well appearing, and in no distress  Mental status: alert, oriented to person, place, and time  Skin: warm & dry   Extremities: Edema: None    Cardiovascular: normal heart rate noted  Respiratory: normal respiratory effort, no distress  Abdomen: gravid, soft, non-tender  Pelvic: Cervical exam performed  Dilation: 1 Effacement (%): Thick Station: Ballotable  Fetal Status:   Fundal Height: 35 cm Movement: Present Presentation: Vertex  Fetal Surveillance Testing today: worked in today for bpp/dopp US 36+4 wks,cephalic,BPP 8/8,AFI 12.6 cm,ant pl gr 2,normal ovaries,fhr 148 bpm,RI .54,.64=61.5 %  Results for orders placed or performed in visit on 12/07/16 (from  the past 24 hour(s))  POCT Urinalysis Dipstick   Collection Time: 12/07/16  2:30 PM  Result Value Ref Range   Color, UA     Clarity, UA     Glucose, UA Negative    Bilirubin, UA     Ketones, UA Negative    Spec Grav, UA  1.010 - 1.025   Blood, UA Negative    pH, UA  5.0 - 8.0   Protein, UA negative    Urobilinogen, UA  0.2 or 1.0 E.U./dL   Nitrite, UA Negative    Leukocytes, UA Negative Negative    Assessment & Plan:  1) High-risk pregnancy [id number] at 4777w4d with an Estimated Date of Delivery: 12/31/16   2) AMA 41yo, continue baby ASA  Labs/procedures today: u/s, gbs, gc/ct  Treatment Plan:  U/S @ 38wks    2x/wk testing nst/sono    Deliver @ 40wks   Reviewed: Preterm labor symptoms and general obstetric precautions including but not limited to vaginal bleeding, contractions, leaking of fluid and fetal movement were reviewed in detail with the patient.  All questions were answered.  Follow-up: Return for Friday for HROB/NST, then Tues for HROB and bpp/dopp u/s (schedule Tues/Fri through 11/20).  Orders Placed This Encounter  Procedures  . GC/Chlamydia Probe Amp  . Culture, beta strep (group b only)  . US FETAL BPP WO NON STRESS  . US UA Cord Doppler  . POCT Urinalysis Dipstick   Marge DuncansBooker, Shirlean Berman Randall CNM, [facility]NP[facility] 12/07/2016 3:40 PM

## 2016-12-07 NOTE — Patient Instructions (Signed)
Michelle MoralesNicole Payne, I greatly value your feedback.  If you receive a survey following your visit with us today, we appreciate you taking the time to fill it out.  Thanks, Michelle HaffKim Payne, CNM, WHNP-BC   Call the office [phone]) or go to [facility] if:  You begin to have strong, frequent contractions  Your water breaks.  Sometimes it is a big gush of fluid, sometimes it is just a trickle that keeps getting your panties wet or running down your legs  You have vaginal bleeding.  It is normal to have a small amount of spotting if your cervix was checked.   You don't feel your baby moving like normal.  If you don't, get you something to eat and drink and lay down and focus on feeling your baby move.  You should feel at least 10 movements in 2 hours.  If you don't, you should call the office or go to [facility].     [facility] Cont[facility] Contractions of the uterus can occur throughout pregnancy, but they are not always a sign that you are in labor. You may have practice contractions called Braxton Hicks contractions. These false labor contractions are sometimes confused with true labor. What are Michelle PeltonBraxton Hicks contractions? Braxton Hicks contractions are tightening movements that occur in the muscles of the uterus before labor. Unlike true labor contractions, these contractions do not result in opening (dilation) and thinning of the cervix. Toward the end of pregnancy (32-34 weeks), Braxton Hicks contractions can happen more often and may become stronger. These contractions are sometimes difficult to tell apart from true labor because they can be very uncomfortable. You should not feel embarrassed if you go to the hospital with false labor. Sometimes, the only way to tell if you are in true labor is for your health care provider to look for changes in the cervix. The health care provider will do a physical exam and may monitor your contractions. If you are not in true labor, the exam should show  that your cervix is not dilating and your water has not broken. If there are no prenatal problems or other health problems associated with your pregnancy, it is completely safe for you to be sent home with false labor. You may continue to have Braxton Hicks contractions until you go into true labor. How can I tell the difference between true labor and false labor?  Differences ? False labor ? Contractions last 30-70 seconds.: Contractions are usually shorter and not as strong as true labor contractions. ? Contractions become very regular.: Contractions are usually irregular. ? Discomfort is usually felt in the top of the uterus, and it spreads to the lower abdomen and low back.: Contractions are often felt in the front of the lower abdomen and in the groin. ? Contractions do not go away with walking.: Contractions may go away when you walk around or change positions while lying down. ? Contractions usually become more intense and increase in frequency.: Contractions get weaker and are shorter-lasting as time goes on. ? The cervix dilates and gets thinner.: The cervix usually does not dilate or become thin. Follow these instructions at home:  Take over-the-counter and prescription medicines only as told by your health care provider.  Keep up with your usual exercises and follow other instructions from your health care provider.  Eat and drink lightly if you think you are going into labor.  If Braxton Hicks contractions are making you uncomfortable: ? Change your position from lying down  or resting to walking, or change from walking to resting. ? Sit and rest in a tub of warm water. ? Drink enough fluid to keep your urine clear or pale yellow. Dehydration may cause these contractions. ? Do slow and deep breathing several times an hour.  Keep all follow-up prenatal visits as told by your health care provider. This is important. Contact a health care provider if:  You have a fever.  You  have continuous pain in your abdomen. Get help right away if:  Your contractions become stronger, more regular, and closer together.  You have fluid leaking or gushing from your vagina.  You pass blood-tinged mucus (bloody show).  You have bleeding from your vagina.  You have low back pain that you never had before.  You feel your baby's head pushing down and causing pelvic pressure.  Your baby is not moving inside you as much as it used to. Summary  Contractions that occur before labor are called Braxton Hicks contractions, false labor, or practice contractions.  Braxton Hicks contractions are usually shorter, weaker, farther apart, and less regular than true labor contractions. True labor contractions usually become progressively stronger and regular and they become more frequent.  Manage discomfort from [facility] contractions by changing position, resting in a warm bath, drinking plenty of water, or practicing deep breathing. This information is not intended to replace advice given to you by your health care provider. Make sure you discuss any questions you have with your health care provider. Document Released: 01/25/2005 Document Revised: 12/15/2015 Document Reviewed: 12/15/2015 Elsevier Interactive Patient Education  2017 [employer]

## 2016-12-07 NOTE — Progress Notes (Signed)
US 36+4 wks,cephalic,BPP 8/8,AFI 12.6 cm,ant pl gr 2,normal ovaries,fhr 148 bpm,RI .54,.64=61.5 %

## 2016-12-09 LAB — GC/CHLAMYDIA PROBE AMP
CHLAMYDIA, DNA PROBE: NEGATIVE
Neisseria gonorrhoeae by PCR: NEGATIVE

## 2016-12-10 ENCOUNTER — Ambulatory Visit (INDEPENDENT_AMBULATORY_CARE_PROVIDER_SITE_OTHER): Payer: Medicaid Other | Admitting: Obstetrics and Gynecology

## 2016-12-10 ENCOUNTER — Encounter: Payer: Self-pay | Admitting: Obstetrics and Gynecology

## 2016-12-10 VITALS — BP 122/64 | HR 97 | Wt 237.0 lb

## 2016-12-10 DIAGNOSIS — O0993 Supervision of high risk pregnancy, unspecified, third trimester: Secondary | ICD-10-CM

## 2016-12-10 DIAGNOSIS — Z1389 Encounter for screening for other disorder: Secondary | ICD-10-CM | POA: Diagnosis not present

## 2016-12-10 DIAGNOSIS — O09523 Supervision of elderly multigravida, third trimester: Secondary | ICD-10-CM

## 2016-12-10 DIAGNOSIS — Z3A37 37 weeks gestation of pregnancy: Secondary | ICD-10-CM

## 2016-12-10 DIAGNOSIS — Z331 Pregnant state, incidental: Secondary | ICD-10-CM

## 2016-12-10 LAB — POCT URINALYSIS DIPSTICK
COLOR UA: NEGATIVE
Glucose, UA: NEGATIVE
Ketones, UA: NEGATIVE
LEUKOCYTES UA: NEGATIVE
NITRITE UA: NEGATIVE
RBC UA: NEGATIVE

## 2016-12-10 NOTE — Progress Notes (Signed)
Michelle Payne is a 41 y.o. female High Risk Pregnancy HROB Diagnosis(es):   AMA >40  [id number] 2147w0d Estimated Date of Delivery: 12/31/16    HPI: The patient is being seen today for ongoing management of  AMA >40 Today she reports no complaints Patient reports good fetal movement, denies any bleeding and no rupture of membranes symptoms or regular contractions.   BP weight and urine results reviewed and noted. Blood pressure 122/64, pulse 97, weight 237 lb (107.5 kg).  Fundal Height:  39.5 cm Fetal Heart rate:  145 with accels to 165 Physical Examination: Abdomen - soft, nontender, nondistended, no masses or organomegaly                                     Pelvic - Not indicated                                     Edema:  None  Urinalysis: trace protein  Fetal Surveillance Testing today:  NST - reactive  Lab and sonogram results have been reviewed.   Assessment:  1.  Pregnancy at 2647w0d,  [id number]   :  Estimated Date of Delivery: 12/31/16                        2.  AMA 41 yo                      Medication(s) Plans:  Baby aspirin  Treatment Plan:  U/S @ 38wks    2x/wk testing nst/sono    Deliver @ 40wks   Follow up in 4 days for appointment for high risk OB care, HROB BPP/dopp U/S    By signing my name below, I, Izna Ahmed, attest that this documentation has been prepared under the direction and in the presence of Tilda BurrowFerguson, Crystina Borrayo V, MD. Electronically Signed: Redge GainerIzna Ahmed, Medical Scribe. 12/10/16. 11:50 AM.  I personally performed the services described in this documentation, which was SCRIBED in my presence. The recorded information has been reviewed and considered accurate. It has been edited as necessary during review. Tilda BurrowFERGUSON,Nashid Pellum V, MD

## 2016-12-11 LAB — CULTURE, BETA STREP (GROUP B ONLY): STREP GP B CULTURE: NEGATIVE

## 2016-12-12 ENCOUNTER — Encounter: Payer: Self-pay | Admitting: Obstetrics & Gynecology

## 2016-12-13 ENCOUNTER — Other Ambulatory Visit: Payer: Self-pay | Admitting: Women's Health

## 2016-12-13 DIAGNOSIS — O09523 Supervision of elderly multigravida, third trimester: Secondary | ICD-10-CM

## 2016-12-14 ENCOUNTER — Encounter: Payer: Self-pay | Admitting: Obstetrics & Gynecology

## 2016-12-14 ENCOUNTER — Ambulatory Visit (INDEPENDENT_AMBULATORY_CARE_PROVIDER_SITE_OTHER): Payer: Medicaid Other | Admitting: Obstetrics & Gynecology

## 2016-12-14 ENCOUNTER — Ambulatory Visit (INDEPENDENT_AMBULATORY_CARE_PROVIDER_SITE_OTHER): Payer: Medicaid Other

## 2016-12-14 VITALS — BP 112/58 | HR 86 | Wt 239.5 lb

## 2016-12-14 DIAGNOSIS — Z3A37 37 weeks gestation of pregnancy: Secondary | ICD-10-CM | POA: Diagnosis not present

## 2016-12-14 DIAGNOSIS — Z1389 Encounter for screening for other disorder: Secondary | ICD-10-CM | POA: Diagnosis not present

## 2016-12-14 DIAGNOSIS — O09523 Supervision of elderly multigravida, third trimester: Secondary | ICD-10-CM

## 2016-12-14 DIAGNOSIS — O0993 Supervision of high risk pregnancy, unspecified, third trimester: Secondary | ICD-10-CM

## 2016-12-14 DIAGNOSIS — F1910 Other psychoactive substance abuse, uncomplicated: Secondary | ICD-10-CM

## 2016-12-14 DIAGNOSIS — Z331 Pregnant state, incidental: Secondary | ICD-10-CM | POA: Diagnosis not present

## 2016-12-14 LAB — POCT URINALYSIS DIPSTICK
GLUCOSE UA: NEGATIVE
Ketones, UA: NEGATIVE
Leukocytes, UA: NEGATIVE
NITRITE UA: NEGATIVE
PROTEIN UA: NEGATIVE
RBC UA: NEGATIVE

## 2016-12-14 NOTE — Progress Notes (Signed)
US 37+4 wks,cephalic,fhr 119 bpm,BPP 8/8,bilat adnexa's wnl,AFI 19.7 cm,RI .65,.71,.66,.65=88%,anterior pl gr 2

## 2016-12-14 NOTE — Progress Notes (Signed)
   HIGH-RISK PREGNANCY VISIT Patient name: Michelle Payne MRN [id number]  Date of birth: August 10, 1975 Chief Complaint:   High Risk Gestation (US today)  History of Present Illness:   Michelle Payne is a 41 y.o. [id number] female at 8146w4d with an Estimated Date of Delivery: 12/31/16 being seen today for ongoing management of a high-risk pregnancy complicated by advanced maternal age of 41 years old.  Today she reports no complaints. Contractions: Irregular. Vag. Bleeding: None.  Movement: Present. denies leaking of fluid.  Review of Systems:   Pertinent items are noted in HPI Denies abnormal vaginal discharge w/ itching/odor/irritation, headaches, visual changes, shortness of breath, chest pain, abdominal pain, severe nausea/vomiting, or problems with urination or bowel movements unless otherwise stated above. Pertinent History Reviewed:  Reviewed past medical,surgical, social, obstetrical and family history.  Reviewed problem list, medications and allergies. Physical Assessment:   Vitals:   12/14/16 1344  BP: (!) 112/58  Pulse: 86  Weight: 239 lb 8 oz (108.6 kg)  Body mass index is 41.11 kg/m.           Physical Examination:   General appearance: alert, well appearing, and in no distress  Mental status: alert, oriented to person, place, and time  Skin: warm & dry   Extremities: Edema: None    Cardiovascular: normal heart rate noted  Respiratory: normal respiratory effort, no distress  Abdomen: gravid, soft, non-tender  Pelvic: Cervical exam deferred         Fetal Status:     Movement: Present    Fetal Surveillance Testing today: BPP 8/8  Results for orders placed or performed in visit on 12/14/16 (from the past 24 hour(s))  POCT urinalysis dipstick   Collection Time: 12/14/16  1:40 PM  Result Value Ref Range   Color, UA     Clarity, UA     Glucose, UA neg    Bilirubin, UA     Ketones, UA neg    Spec Grav, UA  1.010 - 1.025   Blood, UA neg    pH, UA  5.0 - 8.0   Protein, UA neg     Urobilinogen, UA  0.2 or 1.0 E.U./dL   Nitrite, UA neg    Leukocytes, UA Negative Negative    Assessment & Plan:  1) High-risk pregnancy [id number] at 5046w4d with an Estimated Date of Delivery: 12/31/16   2) AMA >40 y/O, stable   3) History of substance abuse, not engaging  Labs/procedures today: sonogram  Treatment Plan:  Weekly BPP with induction 40 weeks or as clinically indicated  Reviewed: Term labor symptoms and general obstetric precautions including but not limited to vaginal bleeding, contractions, leaking of fluid and fetal movement were reviewed in detail with the patient.  All questions were answered.  Follow-up: Return in about 1 week (around 12/21/2016) for BPP/sono, HROB.  Orders Placed This Encounter  Procedures  . POCT urinalysis dipstick   Lot Medford H CNM, WHNP-BC 12/14/2016 2:18 PM

## 2016-12-17 ENCOUNTER — Other Ambulatory Visit: Payer: Medicaid Other | Admitting: Women's Health

## 2016-12-20 ENCOUNTER — Other Ambulatory Visit: Payer: Self-pay | Admitting: Obstetrics & Gynecology

## 2016-12-20 DIAGNOSIS — O09523 Supervision of elderly multigravida, third trimester: Secondary | ICD-10-CM

## 2016-12-21 ENCOUNTER — Ambulatory Visit (INDEPENDENT_AMBULATORY_CARE_PROVIDER_SITE_OTHER): Payer: Medicaid Other | Admitting: Women's Health

## 2016-12-21 ENCOUNTER — Encounter: Payer: Self-pay | Admitting: Women's Health

## 2016-12-21 ENCOUNTER — Ambulatory Visit (INDEPENDENT_AMBULATORY_CARE_PROVIDER_SITE_OTHER): Payer: Medicaid Other

## 2016-12-21 VITALS — BP 124/62 | HR 82 | Wt 241.0 lb

## 2016-12-21 DIAGNOSIS — Z3A38 38 weeks gestation of pregnancy: Secondary | ICD-10-CM

## 2016-12-21 DIAGNOSIS — O0993 Supervision of high risk pregnancy, unspecified, third trimester: Secondary | ICD-10-CM

## 2016-12-21 DIAGNOSIS — O09523 Supervision of elderly multigravida, third trimester: Secondary | ICD-10-CM

## 2016-12-21 DIAGNOSIS — Z331 Pregnant state, incidental: Secondary | ICD-10-CM

## 2016-12-21 DIAGNOSIS — Z1389 Encounter for screening for other disorder: Secondary | ICD-10-CM | POA: Diagnosis not present

## 2016-12-21 LAB — POCT URINALYSIS DIPSTICK
GLUCOSE UA: NEGATIVE
Ketones, UA: NEGATIVE
Leukocytes, UA: NEGATIVE
NITRITE UA: NEGATIVE

## 2016-12-21 NOTE — Progress Notes (Signed)
   HIGH-RISK PREGNANCY VISIT Patient name: Michelle Payne Cansler MRN [id number]  Date of birth: [birth year] Chief Complaint:   High Risk Gestation (US today)  History of Present Illness:   Michelle Payne Condie is a 41 y.o. [id number] female at 1037w4d with an Estimated Date of Delivery: 12/31/16 being seen today for ongoing management of a high-risk pregnancy complicated by AMA 41yo.  Today she reports no complaints. Contractions: Not present. Vag. Bleeding: None.  Movement: Present. denies leaking of fluid.  Review of Systems:   Pertinent items are noted in HPI Denies abnormal vaginal discharge w/ itching/odor/irritation, headaches, visual changes, shortness of breath, chest pain, abdominal pain, severe nausea/vomiting, or problems with urination or bowel movements unless otherwise stated above. Pertinent History Reviewed:  Reviewed past medical,surgical, social, obstetrical and family history.  Reviewed problem list, medications and allergies. Physical Assessment:   Vitals:   12/21/16 1419  BP: 124/62  Pulse: 82  Weight: 241 lb (109.3 kg)  Body mass index is 41.37 kg/m.           Physical Examination:   General appearance: alert, well appearing, and in no distress  Mental status: alert, oriented to person, place, and time  Skin: warm & dry   Extremities: Edema: None    Cardiovascular: normal heart rate noted  Respiratory: normal respiratory effort, no distress  Abdomen: gravid, soft, non-tender  Pelvic: Cervical exam performed  Dilation: 1.5 Effacement (%): Thick Station: Ballotable  Fetal Status: Fetal Heart Rate (bpm): + u/s Fundal Height: 37 cm Movement: Present Presentation: Vertex  Fetal Surveillance Testing today: US 38+4 wks,cephalic,BPP 8/8,anterior pl gr 3,afi 20 cm,RI .63,.59=79%,EFW 3706 g 79%   Results for orders placed or performed in visit on 12/21/16 (from the past 24 hour(s))  POCT urinalysis dipstick   Collection Time: 12/21/16  2:21 PM  Result Value Ref Range   Color, UA     Clarity, UA     Glucose, UA neg    Bilirubin, UA     Ketones, UA neg    Spec Grav, UA  1.010 - 1.025   Blood, UA trace    pH, UA  5.0 - 8.0   Protein, UA trace    Urobilinogen, UA  0.2 or 1.0 E.U./dL   Nitrite, UA neg    Leukocytes, UA Negative Negative    Assessment & Plan:  1) High-risk pregnancy [id number] at 6137w4d with an Estimated Date of Delivery: 12/31/16   2) AMA, 41yo, normal efw/afi/bpp/dopp today's u/s  Labs/procedures today: SVE  Treatment Plan: 2x/wk testing nst/sono   Deliver @ 40wks  Reviewed: Term labor symptoms and general obstetric precautions including but not limited to vaginal bleeding, contractions, leaking of fluid and fetal movement were reviewed in detail with the patient.  All questions were answered.  Follow-up: Return for Frid as scheduled for hrob/nst, then Tues for hrob and bpp/dopp u/s.  Orders Placed This Encounter  Procedures  . POCT urinalysis dipstick   Marge DuncansBooker, Kahron Kauth Randall CNM, [facility]NP[facility] 12/21/2016 2:57 PM

## 2016-12-21 NOTE — Patient Instructions (Signed)
Michelle MoralesNicole Payne, I greatly value your feedback.  If you receive a survey following your visit with us today, we appreciate you taking the time to fill it out.  Thanks, Michelle HaffKim Lavona Payne, CNM, WHNP-BC   Call the office [phone]) or go to [facility] if:  You begin to have strong, frequent contractions  Your water breaks.  Sometimes it is a big gush of fluid, sometimes it is just a trickle that keeps getting your panties wet or running down your legs  You have vaginal bleeding.  It is normal to have a small amount of spotting if your cervix was checked.   You don't feel your baby moving like normal.  If you don't, get you something to eat and drink and lay down and focus on feeling your baby move.  You should feel at least 10 movements in 2 hours.  If you don't, you should call the office or go to [facility].     [facility] Contractions of the uterus can occur throughout pregnancy, but they are not always a sign that you are in labor. You may have practice contractions called Braxton Hicks contractions. These false labor contractions are sometimes confused with true labor. What are Michelle PeltonBraxton Hicks contractions? Braxton Hicks contractions are tightening movements that occur in the muscles of the uterus before labor. Unlike true labor contractions, these contractions do not result in opening (dilation) and thinning of the cervix. Toward the end of pregnancy (32-34 weeks), Braxton Hicks contractions can happen more often and may become stronger. These contractions are sometimes difficult to tell apart from true labor because they can be very uncomfortable. You should not feel embarrassed if you go to the hospital with false labor. Sometimes, the only way to tell if you are in true labor is for your health care provider to look for changes in the cervix. The health care provider will do a physical exam and may monitor your contractions. If you are not in true labor, the exam should show  that your cervix is not dilating and your water has not broken. If there are no prenatal problems or other health problems associated with your pregnancy, it is completely safe for you to be sent home with false labor. You may continue to have Braxton Hicks contractions until you go into true labor. How can I tell the difference between true labor and false labor?  Differences ? False labor ? Contractions last 30-70 seconds.: Contractions are usually shorter and not as strong as true labor contractions. ? Contractions become very regular.: Contractions are usually irregular. ? Discomfort is usually felt in the top of the uterus, and it spreads to the lower abdomen and low back.: Contractions are often felt in the front of the lower abdomen and in the groin. ? Contractions do not go away with walking.: Contractions may go away when you walk around or change positions while lying down. ? Contractions usually become more intense and increase in frequency.: Contractions get weaker and are shorter-lasting as time goes on. ? The cervix dilates and gets thinner.: The cervix usually does not dilate or become thin. Follow these instructions at home:  Take over-the-counter and prescription medicines only as told by your health care provider.  Keep up with your usual exercises and follow other instructions from your health care provider.  Eat and drink lightly if you think you are going into labor.  If Braxton Hicks contractions are making you uncomfortable: ? Change your position from lying down  or resting to walking, or change from walking to resting. ? Sit and rest in a tub of warm water. ? Drink enough fluid to keep your urine clear or pale yellow. Dehydration may cause these contractions. ? Do slow and deep breathing several times an hour.  Keep all follow-up prenatal visits as told by your health care provider. This is important. Contact a health care provider if:  You have a fever.  You  have continuous pain in your abdomen. Get help right away if:  Your contractions become stronger, more regular, and closer together.  You have fluid leaking or gushing from your vagina.  You pass blood-tinged mucus (bloody show).  You have bleeding from your vagina.  You have low back pain that you never had before.  You feel your baby's head pushing down and causing pelvic pressure.  Your baby is not moving inside you as much as it used to. Summary  Contractions that occur before labor are called Braxton Hicks contractions, false labor, or practice contractions.  Braxton Hicks contractions are usually shorter, weaker, farther apart, and less regular than true labor contractions. True labor contractions usually become progressively stronger and regular and they become more frequent.  Manage discomfort from [facility] contractions by changing position, resting in a warm bath, drinking plenty of water, or practicing deep breathing. This information is not intended to replace advice given to you by your health care provider. Make sure you discuss any questions you have with your health care provider. Document Released: 01/25/2005 Document Revised: 12/15/2015 Document Reviewed: 12/15/2015 Elsevier Interactive Patient Education  2017 [employer]

## 2016-12-21 NOTE — Progress Notes (Signed)
US 38+4 wks,cephalic,BPP 8/8,anterior pl gr 3,afi 20 cm,RI .63,.59=79%,EFW 3706 g 79%

## 2016-12-24 ENCOUNTER — Encounter: Payer: Self-pay | Admitting: Women's Health

## 2016-12-24 ENCOUNTER — Other Ambulatory Visit: Payer: Self-pay | Admitting: Women's Health

## 2016-12-24 ENCOUNTER — Ambulatory Visit (INDEPENDENT_AMBULATORY_CARE_PROVIDER_SITE_OTHER): Payer: Medicaid Other | Admitting: Women's Health

## 2016-12-24 VITALS — BP 128/68 | HR 99 | Wt 239.5 lb

## 2016-12-24 DIAGNOSIS — Z331 Pregnant state, incidental: Secondary | ICD-10-CM | POA: Diagnosis not present

## 2016-12-24 DIAGNOSIS — O1213 Gestational proteinuria, third trimester: Secondary | ICD-10-CM

## 2016-12-24 DIAGNOSIS — O09523 Supervision of elderly multigravida, third trimester: Secondary | ICD-10-CM | POA: Diagnosis not present

## 2016-12-24 DIAGNOSIS — Z1389 Encounter for screening for other disorder: Secondary | ICD-10-CM | POA: Diagnosis not present

## 2016-12-24 DIAGNOSIS — Z3A39 39 weeks gestation of pregnancy: Secondary | ICD-10-CM | POA: Diagnosis not present

## 2016-12-24 DIAGNOSIS — O0990 Supervision of high risk pregnancy, unspecified, unspecified trimester: Secondary | ICD-10-CM

## 2016-12-24 LAB — POCT URINALYSIS DIPSTICK
GLUCOSE UA: NEGATIVE
Ketones, UA: NEGATIVE
Nitrite, UA: NEGATIVE

## 2016-12-24 NOTE — Treatment Plan (Signed)
Induction Assessment Scheduling Form Fax to [facility]:  [phone]  Michelle Payne                                                                                   DOB:  January 05, 1976                                                            MRN:  [id number]                                                                     Phone #:   [phone]                         Provider:  [facility]  GP:  [id number]                                                            Estimated Date of Delivery: 12/31/16  Dating Criteria: 9wk u/s    Medical Indications for induction:  AMA Admission Date/Time:  11/23 @ 0800 direct admit, no IOL spots Gestational age on admission:  40.0   Filed Weights   12/24/16 1142  Weight: 239 lb 8 oz (108.6 kg)   HIV:   neg GBS:  neg  SVE: Tight 3/50/-2, vtx   Method of induction(proposed):  pitocin   Scheduling Provider Signature:  Marge DuncansBooker, Nakiesha Rumsey Randall, CNM                                            Today's Date:  12/24/2016

## 2016-12-24 NOTE — Progress Notes (Addendum)
HIGH-RISK PREGNANCY VISIT Patient name: Michelle Payne MRN [id number]  Date of birth: 1975/04/01 Chief Complaint:   High Risk Gestation (NST)  History of Present Illness:   Michelle Moralesicole Freestone is a 41 y.o. [id number] female at 7349w0d with an Estimated Date of Delivery: 12/31/16 being seen today for ongoing management of a high-risk pregnancy complicated by AMA 41yo.  Today she reports no complaints and wants membranes swept. Denies ha, visual changes, ruq/epigastric pain, n/v.   Contractions: Not present. Vag. Bleeding: None.  Movement: Present. denies leaking of fluid.  Review of Systems:   Pertinent items are noted in HPI Denies abnormal vaginal discharge w/ itching/odor/irritation, headaches, visual changes, shortness of breath, chest pain, abdominal pain, severe nausea/vomiting, or problems with urination or bowel movements unless otherwise stated above. Pertinent History Reviewed:  Reviewed past medical,surgical, social, obstetrical and family history.  Reviewed problem list, medications and allergies. Physical Assessment:   Vitals:   12/24/16 1142  BP: 128/68  Pulse: 99  Weight: 239 lb 8 oz (108.6 kg)  Body mass index is 41.11 kg/m.           Physical Examination:   General appearance: alert, well appearing, and in no distress  Mental status: alert, oriented to person, place, and time  Skin: warm & dry   Extremities: Edema: Trace    Cardiovascular: normal heart rate noted  Respiratory: normal respiratory effort, no distress  Abdomen: gravid, soft, non-tender  Pelvic: Cervical exam performed  Dilation: 3 Effacement (%): 50 Station: -2   2/50/-2, vtx, wants membrane sweeping, discussed r/b- pt decided to proceed, so membranes swept. Now tight 3/50/-2, vtx w/ small amt brb on exam glove, had stopped by time pt getting dressed  Fetal Status: Fetal Heart Rate (bpm): 130 Fundal Height: 39 cm Movement: Present Presentation: Vertex  Fetal Surveillance Testing today: NST: FHR baseline 130  bpm, Variability: moderate, Accelerations:present, Decelerations:  Absent= Cat 1/Reactive     Results for orders placed or performed in visit on 12/24/16 (from the past 24 hour(s))  POCT urinalysis dipstick   Collection Time: 12/24/16 11:43 AM  Result Value Ref Range   Color, UA     Clarity, UA     Glucose, UA neg    Bilirubin, UA     Ketones, UA neg    Spec Grav, UA  1.010 - 1.025   Blood, UA trace    pH, UA  5.0 - 8.0   Protein, UA 2+    Urobilinogen, UA  0.2 or 1.0 E.U./dL   Nitrite, UA neg    Leukocytes, UA Trace (A) Negative    Assessment & Plan:  1) High-risk pregnancy [id number] at 4449w0d with an Estimated Date of Delivery: 12/31/16   2) AMA, 41yo, continue baby ASA  3) Proteinuria, 2+ today, bp normal, asymptomatic, no s/s UTI- will send urine cx and p:c ratio. Reviewed pre-e warning s/s, reasons to seek care.  Labs/procedures today: nst  Treatment Plan: 2x/wk testing nst/sono     Deliver @ 40wks, scheduled as direct admit for 11/23 @ 0800 w/ Birdena Crandallainey, L&D charge RN as no IOL slots open, and per [facility] needs IOL @ exactly 40wks d/t AMA.  IOL form faxed via Epic and orders placed   Reviewed: Term labor symptoms and general obstetric precautions including but not limited to vaginal bleeding, contractions, leaking of fluid and fetal movement were reviewed in detail with the patient.  All questions were answered.  Follow-up: Return for As scheduled Tues for HROB, bpp/dopp u/s.  Orders Placed This Encounter  Procedures  . Urine Culture  . Protein / creatinine ratio, urine  . POCT urinalysis dipstick   Marge DuncansBooker, Ladajah Soltys Randall CNM, [facility]BC 12/24/2016 1:08 PM

## 2016-12-24 NOTE — Patient Instructions (Signed)
Michelle MoralesNicole Payne, I greatly value your feedback.  If you receive a survey following your visit with us today, we appreciate you taking the time to fill it out.  Thanks, Michelle HaffKim Payne List, CNM, WHNP-BC  Your induction is scheduled for Friday 11/23 @ 8:00am. [facility] (Emergency) entrance and let them know you are there to be induced. They will send someone from Labor & Delivery to come get you.     Call the office [phone]) or [facility] to [facility] if:  You begin to have strong, frequent contractions  Your water breaks.  Sometimes it is a big gush of fluid, sometimes it is just a trickle that keeps getting your panties wet or running down your legs  You have vaginal bleeding.  It is normal to have a small amount of spotting if your cervix was checked.   You don't feel your baby moving like normal.  If you don't, get you something to eat and drink and lay down and focus on feeling your baby move.  You should feel at least 10 movements in 2 hours.  If you don't, you should call the office or [facility].     [facility] Contractions of the uterus can occur throughout pregnancy, but they are not always a sign that you are in labor. You may have practice contractions called Braxton Hicks contractions. These false labor contractions are sometimes confused with true labor. What are Michelle PeltonBraxton Hicks contractions? Braxton Hicks contractions are tightening movements that occur in the muscles of the uterus before labor. Unlike true labor contractions, these contractions do not result in opening (dilation) and thinning of the cervix. Toward the end of pregnancy (32-34 weeks), Braxton Hicks contractions can happen more often and may become stronger. These contractions are sometimes difficult to tell apart from true labor because they can be very uncomfortable. You should not feel embarrassed if you go to the hospital with false labor. Sometimes, the only  way to tell if you are in true labor is for your health care provider to look for changes in the cervix. The health care provider will do a physical exam and may monitor your contractions. If you are not in true labor, the exam should show that your cervix is not dilating and your water has not broken. If there are no prenatal problems or other health problems associated with your pregnancy, it is completely safe for you to be sent home with false labor. You may continue to have Braxton Hicks contractions until you go into true labor. How can I tell the difference between true labor and false labor?  Differences ? False labor ? Contractions last 30-70 seconds.: Contractions are usually shorter and not as strong as true labor contractions. ? Contractions become very regular.: Contractions are usually irregular. ? Discomfort is usually felt in the top of the uterus, and it spreads to the lower abdomen and low back.: Contractions are often felt in the front of the lower abdomen and in the groin. ? Contractions do not go away with walking.: Contractions may go away when you walk around or change positions while lying down. ? Contractions usually become more intense and increase in frequency.: Contractions get weaker and are shorter-lasting as time goes on. ? The cervix dilates and gets thinner.: The cervix usually does not dilate or become thin. Follow these instructions at home:  Take over-the-counter and prescription medicines only as told by your health care provider.  Keep  up with your usual exercises and follow other instructions from your health care provider.  Eat and drink lightly if you think you are going into labor.  If Braxton Hicks contractions are making you uncomfortable: ? Change your position from lying down or resting to walking, or change from walking to resting. ? Sit and rest in a tub of warm water. ? Drink enough fluid to keep your urine clear or pale yellow. Dehydration may  cause these contractions. ? Do slow and deep breathing several times an hour.  Keep all follow-up prenatal visits as told by your health care provider. This is important. Contact a health care provider if:  You have a fever.  You have continuous pain in your abdomen. Get help right away if:  Your contractions become stronger, more regular, and closer together.  You have fluid leaking or gushing from your vagina.  You pass blood-tinged mucus (bloody show).  You have bleeding from your vagina.  You have low back pain that you never had before.  You feel your baby's head pushing down and causing pelvic pressure.  Your baby is not moving inside you as much as it used to. Summary  Contractions that occur before labor are called Braxton Hicks contractions, false labor, or practice contractions.  Braxton Hicks contractions are usually shorter, weaker, farther apart, and less regular than true labor contractions. True labor contractions usually become progressively stronger and regular and they become more frequent.  Manage discomfort from [facility] contractions by changing position, resting in a warm bath, drinking plenty of water, or practicing deep breathing. This information is not intended to replace advice given to you by your health care provider. Make sure you discuss any questions you have with your health care provider. Document Released: 01/25/2005 Document Revised: 12/15/2015 Document Reviewed: 12/15/2015 Elsevier Interactive Patient Education  2017 [employer]

## 2016-12-25 LAB — PROTEIN / CREATININE RATIO, URINE
CREATININE, UR: 81.1 mg/dL
PROTEIN UR: 66.6 mg/dL
Protein/Creat Ratio: 821 mg/g creat — ABNORMAL HIGH (ref 0–200)

## 2016-12-26 LAB — URINE CULTURE

## 2016-12-27 ENCOUNTER — Other Ambulatory Visit: Payer: Self-pay | Admitting: Women's Health

## 2016-12-27 DIAGNOSIS — O09513 Supervision of elderly primigravida, third trimester: Secondary | ICD-10-CM

## 2016-12-28 ENCOUNTER — Encounter: Payer: Self-pay | Admitting: Women's Health

## 2016-12-28 ENCOUNTER — Ambulatory Visit (INDEPENDENT_AMBULATORY_CARE_PROVIDER_SITE_OTHER): Payer: Medicaid Other

## 2016-12-28 ENCOUNTER — Other Ambulatory Visit: Payer: Self-pay

## 2016-12-28 ENCOUNTER — Encounter: Payer: Self-pay | Admitting: Obstetrics & Gynecology

## 2016-12-28 ENCOUNTER — Ambulatory Visit (INDEPENDENT_AMBULATORY_CARE_PROVIDER_SITE_OTHER): Payer: Medicaid Other | Admitting: Obstetrics & Gynecology

## 2016-12-28 VITALS — BP 118/74 | HR 99 | Wt 238.0 lb

## 2016-12-28 DIAGNOSIS — O09523 Supervision of elderly multigravida, third trimester: Secondary | ICD-10-CM

## 2016-12-28 DIAGNOSIS — Z331 Pregnant state, incidental: Secondary | ICD-10-CM | POA: Diagnosis not present

## 2016-12-28 DIAGNOSIS — O1213 Gestational proteinuria, third trimester: Secondary | ICD-10-CM | POA: Insufficient documentation

## 2016-12-28 DIAGNOSIS — O09513 Supervision of elderly primigravida, third trimester: Secondary | ICD-10-CM | POA: Diagnosis not present

## 2016-12-28 DIAGNOSIS — Z1389 Encounter for screening for other disorder: Secondary | ICD-10-CM

## 2016-12-28 DIAGNOSIS — Z3A39 39 weeks gestation of pregnancy: Secondary | ICD-10-CM

## 2016-12-28 DIAGNOSIS — O0993 Supervision of high risk pregnancy, unspecified, third trimester: Secondary | ICD-10-CM

## 2016-12-28 LAB — POCT URINALYSIS DIPSTICK
GLUCOSE UA: NEGATIVE
KETONES UA: NEGATIVE
Leukocytes, UA: NEGATIVE
NITRITE UA: NEGATIVE
RBC UA: NEGATIVE

## 2016-12-28 NOTE — Progress Notes (Signed)
US 39+4 wks,cephalic,BPP 8/8 FHR 132 bpm,bilat adnexa's wnl,ant pl gr 3, afi 17 cm, RI .55,.54=56%

## 2016-12-28 NOTE — Progress Notes (Signed)
[id number] 4547w4d Estimated Date of Delivery: 12/31/16  Blood pressure 118/74, pulse 99, weight 238 lb (108 kg).   BP weight and urine results all reviewed and noted.  Please refer to the obstetrical flow sheet for the fundal height and fetal heart rate documentation:  Patient reports good fetal movement, denies any bleeding and no rupture of membranes symptoms or regular contractions. Patient is without complaints. All questions were answered.  Orders Placed This Encounter  Procedures  . POCT urinalysis dipstick    Plan:  Continued routine obstetrical care, cx 2.5/20/-3/soft  Return in about 6 weeks (around 02/08/2017) for post partum visit.

## 2016-12-29 ENCOUNTER — Telehealth (HOSPITAL_COMMUNITY): Payer: Self-pay | Admitting: *Deleted

## 2016-12-29 ENCOUNTER — Encounter (HOSPITAL_COMMUNITY): Payer: Self-pay | Admitting: *Deleted

## 2016-12-29 ENCOUNTER — Inpatient Hospital Stay (HOSPITAL_COMMUNITY)
Admission: AD | Admit: 2016-12-29 | Discharge: 2016-12-31 | DRG: 806 | Disposition: A | Discharge status: Home / Self Care | Payer: Medicaid Other | Source: Ambulatory Visit | Attending: Obstetrics & Gynecology | Admitting: Obstetrics & Gynecology

## 2016-12-29 DIAGNOSIS — F1220 Cannabis dependence, uncomplicated: Secondary | ICD-10-CM | POA: Diagnosis present

## 2016-12-29 DIAGNOSIS — O99324 Drug use complicating childbirth: Secondary | ICD-10-CM | POA: Diagnosis present

## 2016-12-29 DIAGNOSIS — F1420 Cocaine dependence, uncomplicated: Secondary | ICD-10-CM | POA: Diagnosis present

## 2016-12-29 DIAGNOSIS — Z3A39 39 weeks gestation of pregnancy: Secondary | ICD-10-CM

## 2016-12-29 DIAGNOSIS — O1213 Gestational proteinuria, third trimester: Principal | ICD-10-CM | POA: Diagnosis present

## 2016-12-29 DIAGNOSIS — Z87891 Personal history of nicotine dependence: Secondary | ICD-10-CM

## 2016-12-29 DIAGNOSIS — Z7982 Long term (current) use of aspirin: Secondary | ICD-10-CM

## 2016-12-29 DIAGNOSIS — Z3483 Encounter for supervision of other normal pregnancy, third trimester: Secondary | ICD-10-CM | POA: Diagnosis present

## 2016-12-29 LAB — CBC
HEMATOCRIT: 38.2 % (ref 36.0–46.0)
HEMOGLOBIN: 12.6 g/dL (ref 12.0–15.0)
MCH: 31.9 pg (ref 26.0–34.0)
MCHC: 33 g/dL (ref 30.0–36.0)
MCV: 96.7 fL (ref 78.0–100.0)
PLATELETS: 235 10*3/uL (ref 150–400)
RBC: 3.95 MIL/uL (ref 3.87–5.11)
RDW: 15 % (ref 11.5–15.5)
WBC: 19.3 10*3/uL — AB (ref 4.0–10.5)

## 2016-12-29 LAB — TYPE AND SCREEN
ABO/RH(D): O POS
Antibody Screen: NEGATIVE

## 2016-12-29 LAB — ABO/RH: ABO/RH(D): O POS

## 2016-12-29 MED ORDER — LIDOCAINE HCL (PF) 1 % IJ SOLN
30.0000 mL | INTRAMUSCULAR | Status: DC | PRN
  Filled 2016-12-29: qty 30

## 2016-12-29 MED ORDER — FENTANYL CITRATE (PF) 100 MCG/2ML IJ SOLN
100.0000 ug | INTRAMUSCULAR | Status: DC | PRN
  Administered 2016-12-29 (×2): 50 ug via INTRAVENOUS

## 2016-12-29 MED ORDER — FENTANYL CITRATE (PF) 100 MCG/2ML IJ SOLN
INTRAMUSCULAR | Status: AC
  Filled 2016-12-29: qty 2

## 2016-12-29 MED ORDER — OXYCODONE-ACETAMINOPHEN 5-325 MG PO TABS
1.0000 | ORAL_TABLET | ORAL | Status: DC | PRN
  Administered 2016-12-30 (×3): 1 via ORAL
  Filled 2016-12-29 (×3): qty 1

## 2016-12-29 MED ORDER — NALOXONE HCL 0.4 MG/ML IJ SOLN
INTRAMUSCULAR | Status: AC
  Filled 2016-12-29: qty 1

## 2016-12-29 MED ORDER — ACETAMINOPHEN 325 MG PO TABS: 650.0000 mg | ORAL_TABLET | ORAL | Status: DC | PRN

## 2016-12-29 MED ORDER — OXYTOCIN BOLUS FROM INFUSION
500.0000 mL | Freq: Once | INTRAVENOUS | Status: AC
  Administered 2016-12-29: 500 mL via INTRAVENOUS

## 2016-12-29 MED ORDER — LACTATED RINGERS IV SOLN
INTRAVENOUS | Status: DC
  Administered 2016-12-29: 22:00:00 via INTRAVENOUS

## 2016-12-29 MED ORDER — ONDANSETRON HCL 4 MG/2ML IJ SOLN: 4.0000 mg | Freq: Four times a day (QID) | INTRAMUSCULAR | Status: DC | PRN

## 2016-12-29 MED ORDER — LACTATED RINGERS IV SOLN: 500.0000 mL | INTRAVENOUS | Status: DC | PRN

## 2016-12-29 MED ORDER — OXYTOCIN 40 UNITS IN LACTATED RINGERS INFUSION - SIMPLE MED
INTRAVENOUS | Status: AC
  Filled 2016-12-29: qty 1000

## 2016-12-29 MED ORDER — SOD CITRATE-CITRIC ACID 500-334 MG/5ML PO SOLN: 30.0000 mL | ORAL | Status: DC | PRN

## 2016-12-29 MED ORDER — OXYCODONE-ACETAMINOPHEN 5-325 MG PO TABS
2.0000 | ORAL_TABLET | ORAL | Status: DC | PRN
  Administered 2016-12-31: 2 via ORAL
  Filled 2016-12-29: qty 2

## 2016-12-29 MED ORDER — LIDOCAINE HCL (PF) 1 % IJ SOLN
INTRAMUSCULAR | Status: AC
  Filled 2016-12-29: qty 30

## 2016-12-29 MED ORDER — OXYTOCIN 40 UNITS IN LACTATED RINGERS INFUSION - SIMPLE MED: 2.5000 [IU]/h | INTRAVENOUS | Status: DC

## 2016-12-29 NOTE — Telephone Encounter (Signed)
Preadmission screen  

## 2016-12-29 NOTE — MAU Note (Signed)
Pt here with c/o contractions all day, worsening at 5 pm. Was 2 cm yesterday in the office. Has had 3 prior vaginal deliveries.

## 2016-12-29 NOTE — H&P (Signed)
LABOR AND DELIVERY ADMISSION HISTORY AND PHYSICAL NOTE  Michelle Payne is a 41 y.o. female [phone] with IUP at 4560w5d by 1st trimester US at 9 week presenting for SOL. Patient reports worsening contractions throughout the day. THC/Cocaine/ Benzo positive on 5/2, repeat x3 negative. Baby ASA.  She reports positive fetal movement. She denies leakage of fluid or vaginal bleeding.  Prenatal History/Complications:  Past Medical History: Past Medical History:  Diagnosis Date  . Anxiety   . Arthritis    knees  . Bipolar 2 disorder (HCC)   . Carpal tunnel syndrome on left 02/2016  . COPD (chronic obstructive pulmonary disease) (HCC)    denies home O2  . Depression    depression, bipolar  . Headache    unspecified; 2 x/week    Past Surgical History: Past Surgical History:  Procedure Laterality Date  . CARPAL TUNNEL RELEASE Left 02/24/2016   Procedure: LEFT CARPAL TUNNEL RELEASE;  Surgeon: Betha LoaKevin Kuzma, MD;  Location: [facility];  Service: Orthopedics;  Laterality: Left;  . TOOTH EXTRACTION    . WISDOM TOOTH EXTRACTION      Obstetrical History: OB History    Gravida Para Term Preterm AB Living   7 3 3   3 3    SAB TAB Ectopic Multiple Live Births   3       3      Social History: Social History   Socioeconomic History  . Marital status: Single    Spouse name: None  . Number of children: None  . Years of education: None  . Highest education level: None  Social Needs  . Financial resource strain: None  . Food insecurity - worry: None  . Food insecurity - inability: None  . Transportation needs - medical: None  . Transportation needs - non-medical: None  Occupational History  . None  Tobacco Use  . Smoking status: Former Smoker    Packs/day: 0.25    Years: 15.00    Pack years: 3.75    Last attempt to quit: 07/29/2016    Years since quitting: 0.4  . Smokeless tobacco: Never Used  Substance and Sexual Activity  . Alcohol use: No  . Drug use: No     Comment: last used 06/09/16  . Sexual activity: Yes    Birth control/protection: None  Other Topics Concern  . None  Social History Narrative  . None    Family History: Family History  Problem Relation Age of Onset  . Diabetes Paternal Grandmother   . Heart disease Maternal Grandmother   . Cancer Maternal Grandfather   . Hypertension Mother   . ADD / ADHD Son   . ADD / ADHD Son     Allergies: Allergies  Allergen Reactions  . Bee Venom Shortness Of Breath and Swelling    Medications Prior to Admission  Medication Sig Dispense Refill Last Dose  . albuterol (PROVENTIL HFA;VENTOLIN HFA) 108 (90 Base) MCG/ACT inhaler Inhale into the lungs every 6 (six) hours as needed for wheezing or shortness of breath.   12/29/2016 at Unknown time  . aspirin EC 81 MG tablet Take 81 mg by mouth daily.   12/28/2016 at Unknown time  . ondansetron (ZOFRAN-ODT) 4 MG disintegrating tablet Take 1 tablet (4 mg total) by mouth every 6 (six) hours as needed for nausea or vomiting. 30 tablet 1 Past Month at Unknown time  . prenatal vitamin w/FE, FA (PRENATAL 1 + 1) 27-1 MG TABS tablet Take 1 tablet by mouth daily at  12 noon. 30 each 11 12/29/2016 at Unknown time     Review of Systems   All systems reviewed and negative except as stated in HPI  Blood pressure 124/67, pulse (!) 107, temperature 98 F (36.7 C), temperature source Oral, resp. rate 19, height 5\' 4"  (1.626 m), weight 240 lb (108.9 kg), SpO2 97 %. General appearance: alert, cooperative and no distress Lungs: clear to auscultation bilaterally Heart: regular rate and rhythm Abdomen: soft, non-tender; bowel sounds normal Extremities: No calf swelling or tenderness Presentation: cephalic Fetal monitoring:  HR 140, moderate variability, + accels, no decel Uterine activity: q1-2 min Dilation: 5 Effacement (%): 80 Station: -2 Exam by:: traci l rn    Prenatal labs: ABO, Rh: O/Positive/-- (05/15 1520) Antibody: Negative (08/21  0843) Rubella: 3.61 (05/15 1520) RPR: Non Reactive (08/21 0843)  HBsAg: Negative (05/15 1520)  HIV:  Negative GBS: Negative (10/30 0000)  1 hr Glucola: 82/118/50 Genetic screening: negative Anatomy US: Normal   Prenatal Transfer Tool  Maternal Diabetes: No Genetic Screening: Normal Maternal Ultrasounds/Referrals: Normal Fetal Ultrasounds or other Referrals:  None Maternal Substance Abuse:  Yes:  Type: Marijuana, Cocaine, Other: Benzo (5/2) repeat x3 negative Significant Maternal Medications:  Meds include: Other:  ASA 81 mg Significant Maternal Lab Results: Lab values include: Group B Strep negative  No results found for this or any previous visit (from the past 24 hour(s)).  Patient Active Problem List   Diagnosis Date Noted  . Uterine contractions during pregnancy 12/29/2016  . Proteinuria complicating pregnancy in third trimester 12/28/2016  . Supervision of high risk pregnancy, antepartum 06/22/2016  . AMA (advanced maternal age) multigravida 35+ 06/22/2016  . Polysubstance abuse (HCC) 06/22/2016  . Depression with anxiety 06/22/2016  . Bipolar 2 disorder, major depressive episode (HCC) 06/09/2016  . Cocaine use disorder, moderate, dependence (HCC) 06/09/2016  . Cannabis use disorder, moderate, dependence (HCC) 06/09/2016  . UTI (urinary tract infection) 06/09/2016  . Substance induced mood disorder (HCC) 06/09/2016  . Tobacco use disorder 05/25/2016  . Radicular pain in right arm 08/08/2012  . Acute back pain 07/11/2012  . Patellar contusion 07/11/2012    Assessment: Michelle Payne is a 41 y.o. [id number] at 6761w5d here for SOL.  #Labor: q1-2 min #Pain: Per patient #FWB: Cat 1 #ID:  GBS neg  #MOF: Breast  #MOC: POP #Circ:  N/A  Abdoulaye Diallo 12/29/2016, 9:53 PM  .I confirm that I have verified the information documented in the resident's note and that I have also personally reperformed the physical exam and all medical decision making activities. The patient  was seen and examined by me also Agree with note NST reactive and reassuring UCs as listed Cervical exams as listed in note  Aviva SignsWilliams, Marie L, CNM

## 2016-12-30 LAB — HIV ANTIBODY (ROUTINE TESTING W REFLEX): HIV Screen 4th Generation wRfx: NONREACTIVE

## 2016-12-30 LAB — RPR: RPR Ser Ql: NONREACTIVE

## 2016-12-30 MED ORDER — SIMETHICONE 80 MG PO CHEW: 80.0000 mg | CHEWABLE_TABLET | ORAL | Status: DC | PRN

## 2016-12-30 MED ORDER — WITCH HAZEL-GLYCERIN EX PADS: 1.0000 "application " | MEDICATED_PAD | CUTANEOUS | Status: DC | PRN

## 2016-12-30 MED ORDER — SENNOSIDES-DOCUSATE SODIUM 8.6-50 MG PO TABS
2.0000 | ORAL_TABLET | ORAL | Status: DC
  Administered 2016-12-30 – 2016-12-31 (×2): 2 via ORAL
  Filled 2016-12-30 (×2): qty 2

## 2016-12-30 MED ORDER — BENZOCAINE-MENTHOL 20-0.5 % EX AERO: 1.0000 "application " | INHALATION_SPRAY | CUTANEOUS | Status: DC | PRN

## 2016-12-30 MED ORDER — COCONUT OIL OIL
1.0000 "application " | TOPICAL_OIL | Status: DC | PRN
  Administered 2016-12-30: 1 via TOPICAL
  Filled 2016-12-30: qty 120

## 2016-12-30 MED ORDER — ONDANSETRON HCL 4 MG PO TABS: 4.0000 mg | ORAL_TABLET | ORAL | Status: DC | PRN

## 2016-12-30 MED ORDER — ONDANSETRON HCL 4 MG/2ML IJ SOLN: 4.0000 mg | INTRAMUSCULAR | Status: DC | PRN

## 2016-12-30 MED ORDER — DIPHENHYDRAMINE HCL 25 MG PO CAPS: 25.0000 mg | ORAL_CAPSULE | Freq: Four times a day (QID) | ORAL | Status: DC | PRN

## 2016-12-30 MED ORDER — TETANUS-DIPHTH-ACELL PERTUSSIS 5-2.5-18.5 LF-MCG/0.5 IM SUSP: 0.5000 mL | Freq: Once | INTRAMUSCULAR | Status: DC

## 2016-12-30 MED ORDER — PRENATAL MULTIVITAMIN CH
1.0000 | ORAL_TABLET | Freq: Every day | ORAL | Status: DC
  Administered 2016-12-30 – 2016-12-31 (×2): 1 via ORAL
  Filled 2016-12-30 (×2): qty 1

## 2016-12-30 MED ORDER — ZOLPIDEM TARTRATE 5 MG PO TABS: 5.0000 mg | ORAL_TABLET | Freq: Every evening | ORAL | Status: DC | PRN

## 2016-12-30 MED ORDER — ACETAMINOPHEN 325 MG PO TABS: 650.0000 mg | ORAL_TABLET | ORAL | Status: DC | PRN

## 2016-12-30 MED ORDER — DIBUCAINE 1 % RE OINT: 1.0000 "application " | TOPICAL_OINTMENT | RECTAL | Status: DC | PRN

## 2016-12-30 MED ORDER — IBUPROFEN 600 MG PO TABS
600.0000 mg | ORAL_TABLET | Freq: Four times a day (QID) | ORAL | Status: DC
  Administered 2016-12-30 – 2016-12-31 (×7): 600 mg via ORAL
  Filled 2016-12-30 (×6): qty 1

## 2016-12-30 NOTE — Progress Notes (Signed)
POSTPARTUM PROGRESS NOTE  Post Partum Day #1 Subjective:  Michelle Payne is a 41 y.o. [id number] 3113w5d s/p SVD.  No acute events overnight.  Pt denies problems with ambulating, voiding or po intake.  She denies nausea or vomiting.  Pain is well controlled.  She has not had flatus. She has not had bowel movement.  Lochia Minimal.   Objective: Blood pressure 122/68, pulse 99, temperature 98.7 F (37.1 C), temperature source Oral, resp. rate 18, height 5\' 4"  (1.626 m), weight 240 lb (108.9 kg), SpO2 97 %, unknown if currently breastfeeding.  Physical Exam:  General: alert, cooperative and no distress Lochia:normal flow Chest: no respiratory distress Heart:regular rate, distal pulses intact Abdomen: soft, nontender,  Uterine Fundus: firm, appropriately tender DVT Evaluation: No calf swelling or tenderness Extremities: mild edema  Recent Labs    12/29/16 2147  HGB 12.6  HCT 38.2    Assessment/Plan:  ASSESSMENT: Michelle Payne is a 41 y.o. [id number] 5813w5d s/p SVD. Progressing well clinically.  Plan for discharge tomorrow   LOS: 1 day   Abdoulaye DialloMD 12/30/2016, 9:11 AM   OB FELLOW POSTPARTUM PROGRESS NOTE ATTESTATION  I have seen and examined this patient and agree with above documentation in the resident's note.  Doing well. Plan to d/c home tomorrow  Frederik PearJulie P Alijah Hyde, MD OB Fellow 11:57 AM

## 2016-12-30 NOTE — Lactation Note (Signed)
This note was copied from a baby's chart. Lactation Consultation Note Mom's 3rd baby. Baby 5 hrs old. Mom stated BF well after delivery. Sleeping soundly now. Encouraged mom to rest while baby sleeping.  Educated newborn behavior,cluster feeding, I&O, supply and demand. Mom encouraged to feed baby 8-12 times/24 hours and with feeding cues.  Mom has tubular breast breast w/space 2 fingers width. Hand expression taught, no colostrum noted at this time. Encouraged to call for assistance or questions. WH/LC brochure given w/resources, support groups and LC services.  Patient Name: Michelle Payne ZOXWR'UToday's Date: 12/30/2016 Reason for consult: Initial assessment   Maternal Data Has patient been taught Hand Expression?: Yes Does the patient have breastfeeding experience prior to this delivery?: Yes  Feeding    LATCH Score                   Interventions Interventions: Breast feeding basics reviewed  Lactation Tools Discussed/Used WIC Program: Yes   Consult Status Consult Status: Follow-up Date: 12/30/16(in pm) Follow-up type: In-patient    Charyl DancerCARVER, Bobie Caris G 12/30/2016, 4:06 AM

## 2016-12-31 ENCOUNTER — Inpatient Hospital Stay (HOSPITAL_COMMUNITY): Admission: RE | Admit: 2016-12-31 | Payer: Medicaid Other | Source: Ambulatory Visit

## 2016-12-31 LAB — RAPID URINE DRUG SCREEN, HOSP PERFORMED
Amphetamines: NOT DETECTED
BARBITURATES: NOT DETECTED
Benzodiazepines: POSITIVE — AB
COCAINE: NOT DETECTED
Opiates: NOT DETECTED
Tetrahydrocannabinol: NOT DETECTED

## 2016-12-31 MED ORDER — IBUPROFEN 600 MG PO TABS: 600.0000 mg | ORAL_TABLET | Freq: Four times a day (QID) | ORAL | 0 refills | Status: DC
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
## 2016-12-31 NOTE — Discharge Summary (Signed)
OB Discharge Summary     Patient Name: Michelle Payne DOB: 05/24/1975 MRN: [id number]  Date of admission: 12/29/2016 Delivering MD: Aviva SignsWILLIAMS, MARIE L   Date of discharge: 12/31/2016  Admitting diagnosis: 39WKS CTX, LABOR Intrauterine pregnancy: 7570w5d     Secondary diagnosis:  Active Problems:   SVD (spontaneous vaginal delivery)  Additional problems: None     Discharge diagnosis: Term Pregnancy Delivered and AMA                                                                                                Post partum procedures:None  Augmentation: None  Complications: None  Hospital course:  Onset of Labor With Vaginal Delivery     41 y.o. yo [id number] at 4470w5d was admitted in Active Labor on 12/29/2016. Patient had an uncomplicated labor course as follows:  Membrane Rupture Time/Date: 10:22 PM ,12/29/2016   Intrapartum Procedures: Episiotomy: None [1]                                         Lacerations:  None [1]  Patient had a delivery of a Viable infant. 12/29/2016  Information for the patient's newborn:  Michelle Payne [[id number]][[id number]]  Delivery Method: Vaginal, Spontaneous(Filed from Delivery Summary)    Pateint had an uncomplicated postpartum course.  She is ambulating, tolerating a regular diet, passing flatus, and urinating well. Patient is discharged home in stable condition on 12/31/16.   Physical exam  Vitals:   12/30/16 0110 12/30/16 0917 12/30/16 1854 12/31/16 0546  BP: 122/68 121/78 108/69 (!) 101/59  Pulse: 99 79 78 68  Resp: 18 15 16 18   Temp: 98.7 F (37.1 C) 98 F (36.7 C) 98.4 F (36.9 C) 97.7 F (36.5 C)  TempSrc: Oral Oral Oral Oral  SpO2:    98%  Weight:      Height:       General: alert, cooperative and no distress Lochia: appropriate Uterine Fundus: firm Incision: N/A DVT Evaluation: No evidence of DVT seen on physical exam. Labs: Lab Results  Component Value Date   WBC 19.3 (H) 12/29/2016   HGB 12.6 12/29/2016   HCT 38.2  12/29/2016   MCV 96.7 12/29/2016   PLT 235 12/29/2016   CMP Latest Ref Rng & Units 06/09/2016  Glucose 65 - 99 mg/dL 027(O130(H)  BUN 6 - 20 mg/dL 12  Creatinine 5.360.44 - 6.441.00 mg/dL 0.340.67  Sodium 742135 - 595145 mmol/L 135  Potassium 3.5 - 5.1 mmol/L 3.1(L)  Chloride 101 - 111 mmol/L 105  CO2 22 - 32 mmol/L 20(L)  Calcium 8.9 - 10.3 mg/dL 9.2  Total Protein 6.5 - 8.1 g/dL 7.4  Total Bilirubin 0.3 - 1.2 mg/dL 0.8  Alkaline Phos 38 - 126 U/L 88  AST 15 - 41 U/L 27  ALT 14 - 54 U/L 16    Discharge instruction: per After Visit Summary and "Baby and Me Booklet".  After visit meds:  Allergies as of 12/31/2016      Reactions   Bee  Venom Shortness Of Breath, Swelling      Medication List    TAKE these medications   albuterol 108 (90 Base) MCG/ACT inhaler Commonly known as:  PROVENTIL HFA;VENTOLIN HFA Inhale into the lungs every 6 (six) hours as needed for wheezing or shortness of breath.   aspirin EC 81 MG tablet Take 81 mg by mouth daily.   ibuprofen 600 MG tablet Commonly known as:  ADVIL,MOTRIN Take 1 tablet (600 mg total) by mouth every 6 (six) hours.   ondansetron 4 MG disintegrating tablet Commonly known as:  ZOFRAN-ODT Take 1 tablet (4 mg total) by mouth every 6 (six) hours as needed for nausea or vomiting.   prenatal vitamin w/FE, FA 27-1 MG Tabs tablet Take 1 tablet by mouth daily at 12 noon.       Diet: routine diet  Activity: Advance as tolerated. Pelvic rest for 6 weeks.   Outpatient follow up:4 weeks Follow up Appt: Future Appointments  Date Time Provider Department Center  02/10/2017  4:00 PM Cheral MarkerBooker, Kimberly R, CNM [facility]   Follow up Visit:No Follow-up on file.  Postpartum contraception: Progesterone only pills  Newborn Data: Live born female  Birth Weight: 7 lb 10.4 oz (3470 g) APGAR: 8, 9  Newborn Delivery   Birth date/time:  12/29/2016 22:33:00 Delivery type:  Vaginal, Spontaneous     Baby Feeding: Breast Disposition:home with  mother   12/31/2016 Michelle NeighboursAbdoulaye Diallo, MD  OB FELLOW DISCHARGE ATTESTATION  I have seen and examined this patient and agree with above documentation in the resident's note.   Frederik PearJulie P Degele, MD OB Fellow 10:53 AM

## 2016-12-31 NOTE — Discharge Instructions (Signed)

## 2016-12-31 NOTE — Lactation Note (Signed)
This note was copied from a baby's chart. Lactation Consultation Note: mom reports baby has been feeding a lot and it is hurting her. Mom using cradle hold and baby sliding down to tip of nipple. Assisted with getting better positioning in football hold. Mom reports that feels much better. Has coconut oil. Asking about formula. Encouraged to wait , to breast feed often with good positioning to promote a good milk supply. Reviewed engorgement prevention and treatment. No further questions at present. Reviewed our phone number, OP appointments and BFSG as resources for support after DC.    Patient Name: Girl Lysle Moralesicole Ury WUJWJ'XToday's Date: 12/31/2016 Reason for consult: Follow-up assessment   Maternal Data Formula Feeding for Exclusion: No Has patient been taught Hand Expression?: Yes Does the patient have breastfeeding experience prior to this delivery?: Yes  Feeding Feeding Type: Breast Fed Length of feed: (still nursing when I left room)  LATCH Score Latch: Grasps breast easily, tongue down, lips flanged, rhythmical sucking.  Audible Swallowing: A few with stimulation  Type of Nipple: Everted at rest and after stimulation  Comfort (Breast/Nipple): Filling, red/small blisters or bruises, mild/mod discomfort  Hold (Positioning): Assistance needed to correctly position infant at breast and maintain latch.  LATCH Score: 7  Interventions Interventions: Breast feeding basics reviewed;Support pillows;Position options;Assisted with latch;Expressed milk;Coconut oil  Lactation Tools Discussed/Used     Consult Status Consult Status: Complete    Pamelia HoitWeeks, Kyian Obst D 12/31/2016, 10:35 AM

## 2017-02-10 ENCOUNTER — Ambulatory Visit (INDEPENDENT_AMBULATORY_CARE_PROVIDER_SITE_OTHER): Payer: Medicaid Other | Admitting: Women's Health

## 2017-02-10 ENCOUNTER — Encounter: Payer: Self-pay | Admitting: Women's Health

## 2017-02-10 ENCOUNTER — Other Ambulatory Visit: Payer: Self-pay

## 2017-02-10 DIAGNOSIS — F418 Other specified anxiety disorders: Secondary | ICD-10-CM | POA: Diagnosis not present

## 2017-02-10 DIAGNOSIS — Z1389 Encounter for screening for other disorder: Secondary | ICD-10-CM | POA: Diagnosis not present

## 2017-02-10 MED ORDER — NORETHINDRONE 0.35 MG PO TABS: 1.0000 | ORAL_TABLET | Freq: Every day | ORAL | 11 refills | Status: DC

## 2017-02-10 NOTE — Progress Notes (Signed)
POSTPARTUM VISIT Patient name: Michelle Payne MRN [id number]  Date of birth: 08/13/1975 Chief Complaint:   Postpartum Care  History of Present Illness:   Michelle Payne is a 42 y.o. [phone] Caucasian female being seen today for a postpartum visit. She is 6 weeks postpartum following a spontaneous vaginal delivery at 39.5 gestational weeks. Anesthesia: none. I have fully reviewed the prenatal and intrapartum course. Pregnancy complicated by AMA, proteinuria at end of pregnancy, h/o polysubstance abuse- all uds negative after 06/09/16, dep/anx- pcp restarted her on celexa and xanax last week. Took 1/2 of 1mg  xanax and slept for 2 hours, so does not want to take anymore- states she actually doesn't feel she needs it. Denies SI/HI/II.  Postpartum course has been uncomplicated. Bleeding no bleeding. Bowel function is normal. Bladder function is normal.  Patient is not sexually active. Last sexual activity: prior to birth of baby.  Contraception method is wants POPs.  [place] Postpartum Depression Screening: negative. Score 1.   Last pap 2017 @ [facility].  Results were normal .  Patient's last menstrual period was 02/07/2017.  Baby's course has been uncomplicated. Baby is feeding by breast.  Review of Systems:   Pertinent items are noted in HPI Denies Abnormal vaginal discharge w/ itching/odor/irritation, headaches, visual changes, shortness of breath, chest pain, abdominal pain, severe nausea/vomiting, or problems with urination or bowel movements. Pertinent History Reviewed:  Reviewed past medical,surgical, obstetrical and family history.  Reviewed problem list, medications and allergies. OB History  Gravida Para Term Preterm AB Living  7 4 4   3 4   SAB TAB Ectopic Multiple Live Births  3     0 4    # Outcome Date GA Lbr Len/2nd Weight Sex Delivery Anes PTL Lv  7 Term 12/29/16 [redacted]w[redacted]d 19:30 / 00:03 7 lb 10.4 oz (3.47 kg) F Vag-Spont None  LIV  6 Term 07/10/04 [redacted]w[redacted]d  7 lb 6 oz (3.345 kg) M Vag-Spont  None N LIV  5 Term 11/04/93 [redacted]w[redacted]d  7 lb (3.175 kg) M Vag-Spont None N LIV  4 Term 11/10/91 [redacted]w[redacted]d  7 lb 2 oz (3.232 kg) F Vag-Spont None N LIV  3 SAB           2 SAB           1 SAB              Physical Assessment:   Vitals:   02/10/17 1559  BP: 132/88  Pulse: 81  Weight: 221 lb (100.2 kg)  Height: 5\' 4"  (1.626 m)  Body mass index is 37.93 kg/m.       Physical Examination:   General appearance: alert, well appearing, and in no distress  Mental status: alert, oriented to person, place, and time  Skin: warm & dry   Cardiovascular: normal heart rate noted   Respiratory: normal respiratory effort, no distress   Breasts: deferred, no complaints   Abdomen: soft, non-tender   Pelvic: VULVA: normal appearing vulva with no masses, tenderness or lesions, UTERUS: uterus is normal size, shape, consistency and nontender  Rectal: No hemorrhoids  Extremities: No edema       No results found for this or any previous visit (from the past 24 hour(s)).  Assessment & Plan:  1) Postpartum exam 2) 6 wks s/p SVB 3) Breastfeeding 4) Depression screening 5) Contraception counseling, pt prefers POPs Rx micronor w/ 11RF, understands has to take at exact same time daily to be effective, if late taking use condom as back-up  6) Dep/anx> continue celexa per PCP, advised no further xanax esp while breastfeeding  Follow-up: Return in about 6 months (around 08/10/2017) for Physical.   No orders of the defined types were placed in this encounter.   Marge DuncansBooker, [place] CNM, [facility]NP-BC 02/10/2017 4:39 PM

## 2017-02-10 NOTE — Patient Instructions (Signed)
Norethindrone tablets (contraception) What is this medicine? NORETHINDRONE (nor eth IN drone) is an oral contraceptive. The product contains a female hormone known as a progestin. It is used to prevent pregnancy. This medicine may be used for other purposes; ask your health care provider or pharmacist if you have questions. COMMON BRAND NAME(S): Camila, Deblitane 28-Day, Errin, Heather, Jencycla, Jolivette, Lyza, Nor-QD, Nora-BE, Norlyroc, Ortho Micronor, Sharobel 28-Day What should I tell my health care provider before I take this medicine? They need to know if you have any of these conditions: -blood vessel disease or blood clots -breast, cervical, or vaginal cancer -diabetes -heart disease -kidney disease -liver disease -mental depression -migraine -seizures -stroke -vaginal bleeding -an unusual or allergic reaction to norethindrone, other medicines, foods, dyes, or preservatives -pregnant or trying to get pregnant -breast-feeding How should I use this medicine? Take this medicine by mouth with a glass of water. You may take it with or without food. Follow the directions on the prescription label. Take this medicine at the same time each day and in the order directed on the package. Do not take your medicine more often than directed. Contact your pediatrician regarding the use of this medicine in children. Special care may be needed. This medicine has been used in female children who have started having menstrual periods. A patient package insert for the product will be given with each prescription and refill. Read this sheet carefully each time. The sheet may change frequently. Overdosage: If you think you have taken too much of this medicine contact a poison control center or emergency room at once. NOTE: This medicine is only for you. Do not share this medicine with others. What if I miss a dose? Try not to miss a dose. Every time you miss a dose or take a dose late your chance of  pregnancy increases. When 1 pill is missed (even if only 3 hours late), take the missed pill as soon as possible and continue taking a pill each day at the regular time (use a back up method of birth control for the next 48 hours). If more than 1 dose is missed, use an additional birth control method for the rest of your pill pack until menses occurs. Contact your health care professional if more than 1 dose has been missed. What may interact with this medicine? Do not take this medicine with any of the following medications: -amprenavir or fosamprenavir -bosentan This medicine may also interact with the following medications: -antibiotics or medicines for infections, especially rifampin, rifabutin, rifapentine, and griseofulvin, and possibly penicillins or tetracyclines -aprepitant -barbiturate medicines, such as phenobarbital -carbamazepine -felbamate -modafinil -oxcarbazepine -phenytoin -ritonavir or other medicines for HIV infection or AIDS -St. John's wort -topiramate This list may not describe all possible interactions. Give your health care provider a list of all the medicines, herbs, non-prescription drugs, or dietary supplements you use. Also tell them if you smoke, drink alcohol, or use illegal drugs. Some items may interact with your medicine. What should I watch for while using this medicine? Visit your doctor or health care professional for regular checks on your progress. You will need a regular breast and pelvic exam and Pap smear while on this medicine. Use an additional method of birth control during the first cycle that you take these tablets. If you have any reason to think you are pregnant, stop taking this medicine right away and contact your doctor or health care professional. If you are taking this medicine for hormone related problems, it   may take several cycles of use to see improvement in your condition. This medicine does not protect you against HIV infection (AIDS)  or any other sexually transmitted diseases. What side effects may I notice from receiving this medicine? Side effects that you should report to your doctor or health care professional as soon as possible: -breast tenderness or discharge -pain in the abdomen, chest, groin or leg -severe headache -skin rash, itching, or hives -sudden shortness of breath -unusually weak or tired -vision or speech problems -yellowing of skin or eyes Side effects that usually do not require medical attention (report to your doctor or health care professional if they continue or are bothersome): -changes in sexual desire -change in menstrual flow -facial hair growth -fluid retention and swelling -headache -irritability -nausea -weight gain or loss This list may not describe all possible side effects. Call your doctor for medical advice about side effects. You may report side effects to FDA at 1-800-FDA-1088. Where should I keep my medicine? Keep out of the reach of children. Store at room temperature between 15 and 30 degrees C (59 and 86 degrees F). Throw away any unused medicine after the expiration date. NOTE: This sheet is a summary. It may not cover all possible information. If you have questions about this medicine, talk to your doctor, pharmacist, or health care provider.  2018 Elsevier/Gold Standard (2011-10-15 16:41:35)  

## 2017-12-05 ENCOUNTER — Ambulatory Visit: Payer: Medicaid Other | Admitting: Women's Health

## 2017-12-05 ENCOUNTER — Encounter: Payer: Self-pay | Admitting: Women's Health

## 2017-12-05 VITALS — BP 124/86 | HR 108 | Ht 64.0 in | Wt 215.0 lb

## 2017-12-05 DIAGNOSIS — N926 Irregular menstruation, unspecified: Secondary | ICD-10-CM | POA: Diagnosis not present

## 2017-12-05 DIAGNOSIS — Z3202 Encounter for pregnancy test, result negative: Secondary | ICD-10-CM

## 2017-12-05 LAB — POCT URINE PREGNANCY: PREG TEST UR: NEGATIVE

## 2017-12-05 MED ORDER — NORETHINDRONE 0.35 MG PO TABS: 1.0000 | ORAL_TABLET | Freq: Every day | ORAL | 11 refills | Status: DC

## 2017-12-05 NOTE — Patient Instructions (Signed)
Progestin-only methods *depo shot every 3 months *pills (what you are on right now) *nexplanon (goes in your arm, lasts 20yrs) *IUD (lasts 5-Liletta or 10 years-Paragard, depending on one you choose)

## 2017-12-05 NOTE — Progress Notes (Signed)
   GYN VISIT Patient name: Michelle Payne MRN [id number]  Date of birth: 1975-03-29 Chief Complaint:   Contraception  History of Present Illness:   Michelle Payne is a 42 y.o. [phone] Caucasian female being seen today for report of period q 2wks since stopping breastfeeding 3 months ago. On micronor, never misses or late for dosage. Denies abnormal discharge, itching/odor/irritation.  No change in sex partners. Offered to switch to different progestin-only contraception d/t age/smoking/high-normal DBP, pt prefers to stay on micronor.   Patient's last menstrual period was 11/28/2017 (approximate). The current method of family planning is oral progesterone-only contraceptive. Last pap 2017 @ [facility]. Results were:  normal Review of Systems:   Pertinent items are noted in HPI Denies fever/chills, dizziness, headaches, visual disturbances, fatigue, shortness of breath, chest pain, abdominal pain, vomiting, abnormal vaginal discharge/itching/odor/irritation, problems with periods, bowel movements, urination, or intercourse unless otherwise stated above.  Pertinent History Reviewed:  Reviewed past medical,surgical, social, obstetrical and family history.  Reviewed problem list, medications and allergies. Physical Assessment:   Vitals:   12/05/17 1600  BP: 124/86  Pulse: (!) 108  Weight: 215 lb (97.5 kg)  Height: 5\' 4"  (1.626 m)  Body mass index is 36.9 kg/m.       Physical Examination:   General appearance: alert, well appearing, and in no distress  Mental status: alert, oriented to person, place, and time  Skin: warm & dry   Cardiovascular: normal heart rate noted  Respiratory: normal respiratory effort, no distress  Abdomen: soft, non-tender   Pelvic: examination not indicated  Extremities: no edema   Results for orders placed or performed in visit on 12/05/17 (from the past 24 hour(s))  POCT urine pregnancy   Collection Time: 12/05/17  4:39 PM  Result Value Ref Range   Preg Test, Ur  Negative Negative    Assessment & Plan:  1) Irregular periods> since stopped breastfeeding ago, taking micronor as directed. Will send gc/ct. Wants to stay on micronor. Let us know if changes mind  Meds:  Meds ordered this encounter  Medications  . norethindrone (MICRONOR,CAMILA,ERRIN) 0.35 MG tablet    Sig: Take 1 tablet (0.35 mg total) by mouth daily.    Dispense:  1 Package    Refill:  11    Order Specific Question:   Supervising Provider    Answer:   Duane Lope H [[id number]]    Orders Placed This Encounter  Procedures  . GC/Chlamydia Probe Amp  . POCT urine pregnancy    Return for Jan for , Pap & physical.  Cheral Marker CNM, [facility] 12/05/2017 4:53 PM

## 2017-12-06 LAB — GC/CHLAMYDIA PROBE AMP
Chlamydia trachomatis, NAA: NEGATIVE
Neisseria gonorrhoeae by PCR: NEGATIVE

## 2018-02-20 ENCOUNTER — Other Ambulatory Visit (HOSPITAL_COMMUNITY)
Admission: RE | Admit: 2018-02-20 | Discharge: 2018-02-20 | Disposition: A | Discharge status: Home / Self Care | Payer: Medicaid Other | Source: Ambulatory Visit | Attending: Obstetrics & Gynecology | Admitting: Obstetrics & Gynecology

## 2018-02-20 ENCOUNTER — Encounter: Payer: Self-pay | Admitting: Women's Health

## 2018-02-20 ENCOUNTER — Ambulatory Visit: Payer: Medicaid Other | Admitting: Women's Health

## 2018-02-20 VITALS — BP 114/81 | HR 86 | Ht 64.0 in | Wt 213.3 lb

## 2018-02-20 DIAGNOSIS — Z0000 Encounter for general adult medical examination without abnormal findings: Secondary | ICD-10-CM | POA: Diagnosis not present

## 2018-02-20 DIAGNOSIS — Z113 Encounter for screening for infections with a predominantly sexual mode of transmission: Secondary | ICD-10-CM | POA: Diagnosis not present

## 2018-02-20 DIAGNOSIS — Z01419 Encounter for gynecological examination (general) (routine) without abnormal findings: Secondary | ICD-10-CM | POA: Insufficient documentation

## 2018-02-20 NOTE — Addendum Note (Signed)
Addended by: Federico FlakeNES, PEGGY A on: 02/20/2018 04:30 PM   Modules accepted: Orders

## 2018-02-20 NOTE — Patient Instructions (Signed)
Call [facility] at [phone] to schedule your mammogram 

## 2018-02-20 NOTE — Progress Notes (Signed)
   WELL-WOMAN EXAMINATION Patient name: Michelle Payne MRN [id number]  Date of birth: 03/14/1975 Chief Complaint:   Gynecologic Exam (pap/physcial)  History of Present Illness:   Michelle Payne is a 43 y.o. [phone] Caucasian female being seen today for a routine well-woman exam.  Current complaints: none  PCP: Sudie Bailey      does desire labs Patient's last menstrual period was 02/11/2018. The current method of family planning is oral progesterone-only contraceptive Last pap 2017 @ [facility]. Results were: normal Last mammogram: never. Results were: n/a. Family h/o breast cancer: No Last colonoscopy: never. Results were: n/a. Family h/o colorectal cancer: No Review of Systems:   Pertinent items are noted in HPI Denies any headaches, blurred vision, fatigue, shortness of breath, chest pain, abdominal pain, abnormal vaginal discharge/itching/odor/irritation, problems with periods, bowel movements, urination, or intercourse unless otherwise stated above. Pertinent History Reviewed:  Reviewed past medical,surgical, social and family history.  Reviewed problem list, medications and allergies. Physical Assessment:   Vitals:   02/20/18 1534  BP: 114/81  Pulse: 86  Weight: 213 lb 4.8 oz (96.8 kg)  Height: 5\' 4"  (1.626 m)  Body mass index is 36.61 kg/m.        Physical Examination:   General appearance - well appearing, and in no distress  Mental status - alert, oriented to person, place, and time  Psych:  She has a normal mood and affect  Skin - warm and dry, normal color, no suspicious lesions noted  Chest - effort normal, all lung fields clear to auscultation bilaterally  Heart - normal rate and regular rhythm  Neck:  midline trachea, no thyromegaly or nodules  Breasts - breasts appear normal, no suspicious masses, no skin or nipple changes or  axillary nodes; Lt breast has small pinpoint white cyst on nipple, nontender  Abdomen - soft, nontender, nondistended, no masses or  organomegaly  Pelvic - VULVA: normal appearing vulva with no masses, tenderness or lesions  VAGINA: normal appearing vagina with normal color and discharge, no lesions  CERVIX: normal appearing cervix without discharge or lesions, no CMT  Thin prep pap is done w/ HR HPV cotesting  UTERUS: uterus is felt to be normal size, shape, consistency and nontender   ADNEXA: No adnexal masses or tenderness noted.  Extremities:  No swelling or varicosities noted  No results found for this or any previous visit (from the past 24 hour(s)).  Assessment & Plan:  1) Well-Woman Exam  2) Past due for screening mammo> gave pt info to call/schedule  3) STD screen  Labs/procedures today: as below  Mammogram-asap  Colonoscopy @42yo  or sooner if problems  Orders Placed This Encounter  Procedures  . CBC  . Comprehensive metabolic panel  . TSH  . RPR  . HIV Antibody (routine testing w rflx)  . Hepatitis B surface antigen    Follow-up: Return in about 1 year (around 02/21/2019) for Physical.  Cheral Marker CNM, [facility] 02/20/2018 4:09 PM

## 2018-02-21 LAB — CBC
HEMOGLOBIN: 12.8 g/dL (ref 11.1–15.9)
Hematocrit: 37.8 % (ref 34.0–46.6)
MCH: 30.3 pg (ref 26.6–33.0)
MCHC: 33.9 g/dL (ref 31.5–35.7)
MCV: 90 fL (ref 79–97)
PLATELETS: 272 10*3/uL (ref 150–450)
RBC: 4.22 x10E6/uL (ref 3.77–5.28)
RDW: 13.2 % (ref 11.7–15.4)
WBC: 11.1 10*3/uL — ABNORMAL HIGH (ref 3.4–10.8)

## 2018-02-21 LAB — COMPREHENSIVE METABOLIC PANEL
A/G RATIO: 1.5 (ref 1.2–2.2)
ALBUMIN: 4.1 g/dL (ref 3.5–5.5)
ALT: 11 IU/L (ref 0–32)
AST: 11 IU/L (ref 0–40)
Alkaline Phosphatase: 141 IU/L — ABNORMAL HIGH (ref 39–117)
BILIRUBIN TOTAL: 0.2 mg/dL (ref 0.0–1.2)
BUN / CREAT RATIO: 14 (ref 9–23)
BUN: 11 mg/dL (ref 6–24)
CO2: 21 mmol/L (ref 20–29)
Calcium: 9.1 mg/dL (ref 8.7–10.2)
Chloride: 102 mmol/L (ref 96–106)
Creatinine, Ser: 0.76 mg/dL (ref 0.57–1.00)
GFR calc non Af Amer: 98 mL/min/{1.73_m2} (ref >59)
GFR, EST AFRICAN AMERICAN: 113 mL/min/{1.73_m2} (ref >59)
Globulin, Total: 2.7 g/dL (ref 1.5–4.5)
Glucose: 91 mg/dL (ref 65–99)
POTASSIUM: 4.4 mmol/L (ref 3.5–5.2)
SODIUM: 140 mmol/L (ref 134–144)
TOTAL PROTEIN: 6.8 g/dL (ref 6.0–8.5)

## 2018-02-21 LAB — HEPATITIS B SURFACE ANTIGEN: Hepatitis B Surface Ag: NEGATIVE

## 2018-02-21 LAB — HIV ANTIBODY (ROUTINE TESTING W REFLEX): HIV Screen 4th Generation wRfx: NONREACTIVE

## 2018-02-21 LAB — TSH: TSH: 1.67 u[IU]/mL (ref 0.450–4.500)

## 2018-02-21 LAB — RPR: RPR Ser Ql: NONREACTIVE

## 2018-02-23 LAB — CYTOLOGY - PAP
Diagnosis: NEGATIVE
Diagnosis: REACTIVE
HPV (WINDOPATH): NOT DETECTED

## 2018-09-19 ENCOUNTER — Other Ambulatory Visit (HOSPITAL_COMMUNITY): Payer: Self-pay | Admitting: Nurse Practitioner

## 2018-09-19 ENCOUNTER — Ambulatory Visit (HOSPITAL_COMMUNITY)
Admission: RE | Admit: 2018-09-19 | Discharge: 2018-09-19 | Disposition: A | Discharge status: Home / Self Care | Payer: Medicaid Other | Source: Ambulatory Visit | Attending: Nurse Practitioner | Admitting: Nurse Practitioner

## 2018-09-19 ENCOUNTER — Other Ambulatory Visit: Payer: Self-pay

## 2018-09-19 DIAGNOSIS — M5440 Lumbago with sciatica, unspecified side: Secondary | ICD-10-CM

## 2018-09-19 DIAGNOSIS — M542 Cervicalgia: Secondary | ICD-10-CM | POA: Diagnosis present

## 2018-09-19 IMAGING — DX LUMBAR SPINE - COMPLETE 4+ VIEW
5 series · 5 of 5 positions shown · non-contrast
Comparison: [DATE]

CLINICAL DATA: Low back pain with sciatica

EXAM:
LUMBAR SPINE - COMPLETE 4+ VIEW

[l-spine ap]
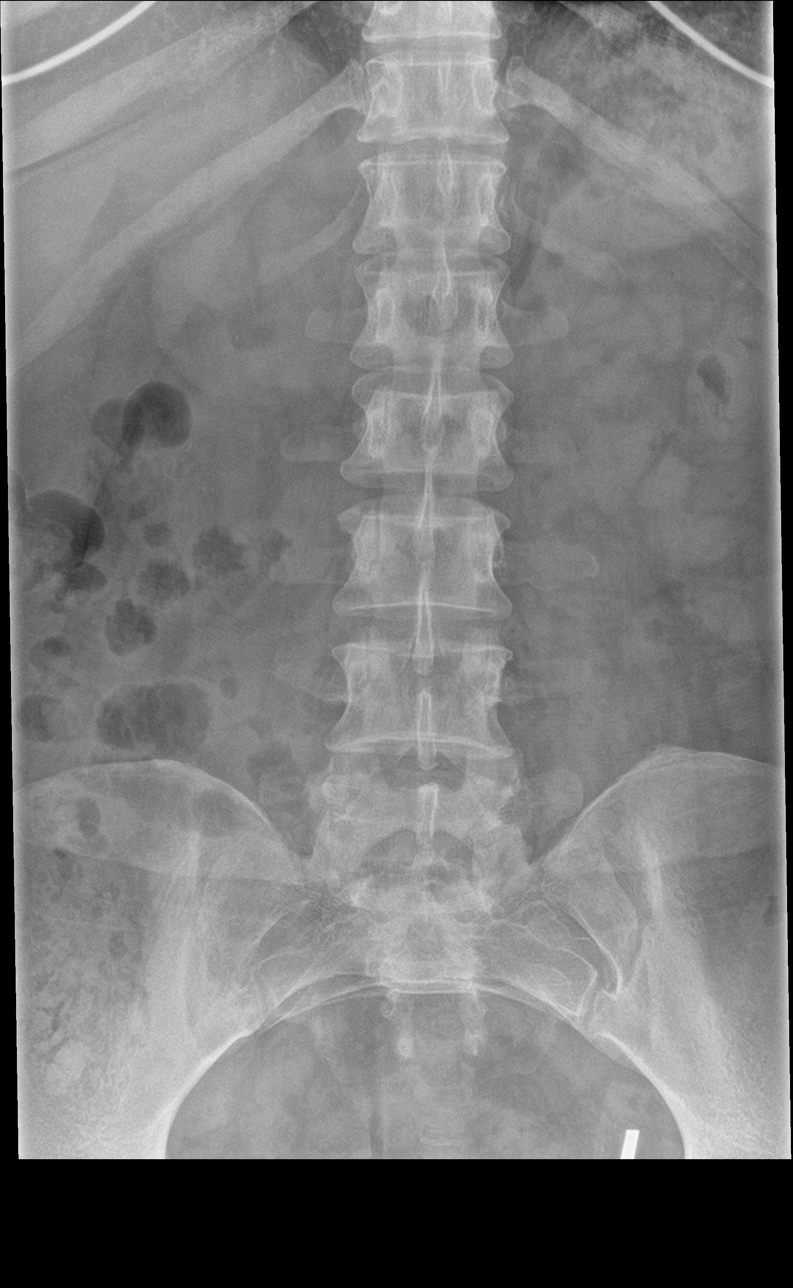

[l-spine obl (1 of 2)]
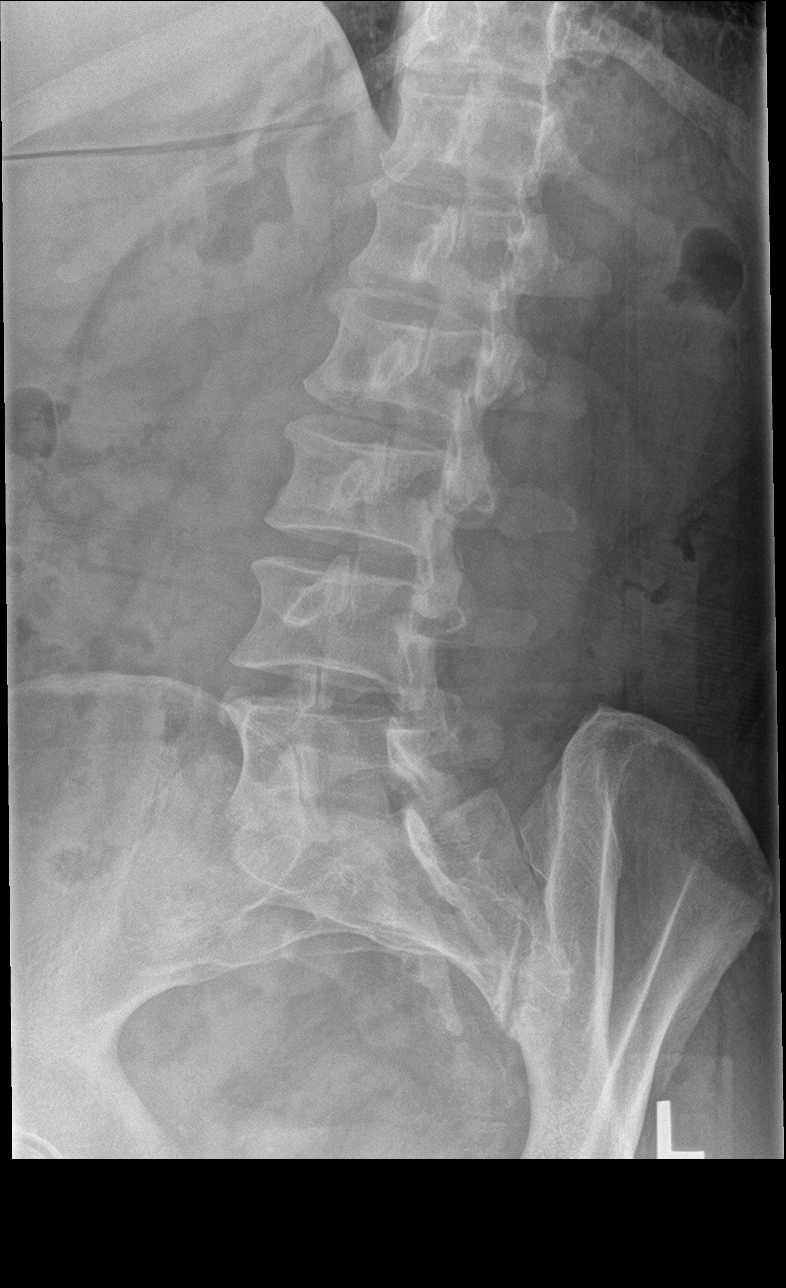

[l-spine obl (2 of 2)]
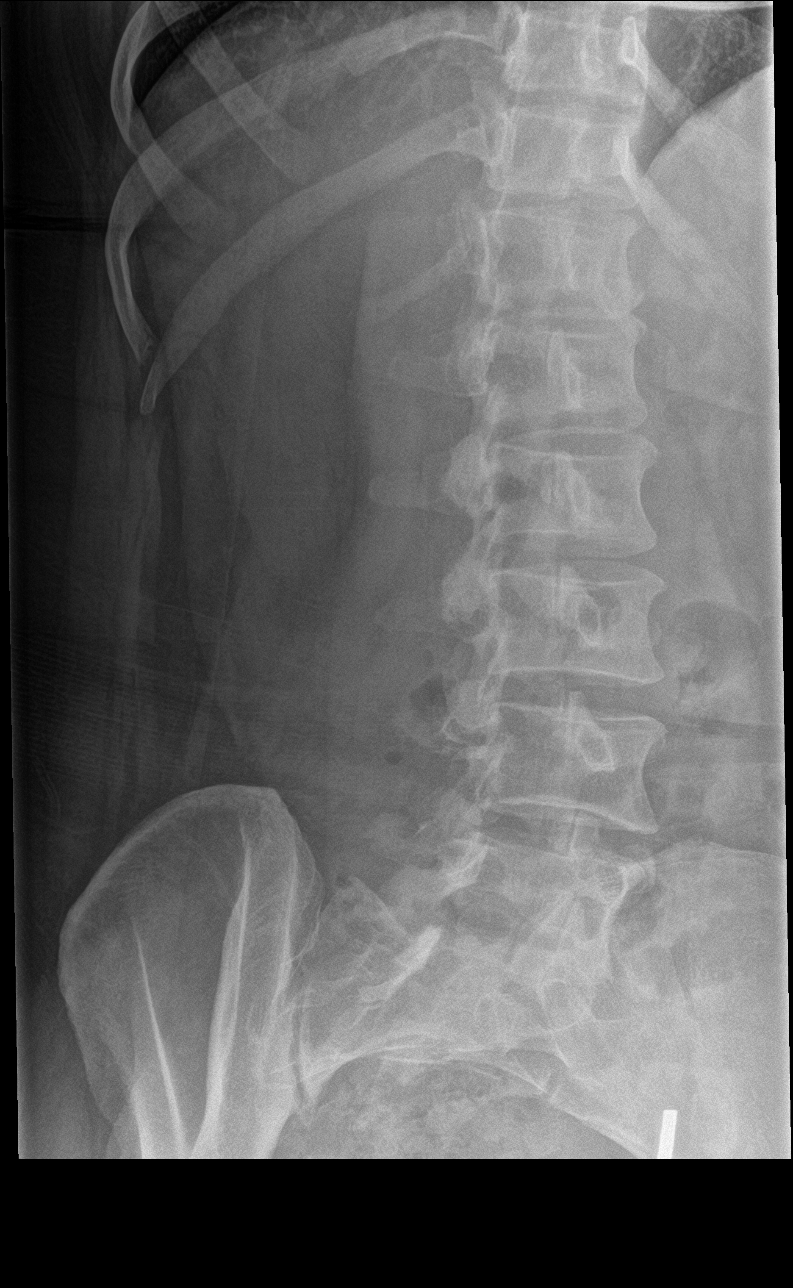

[l-spine lat]
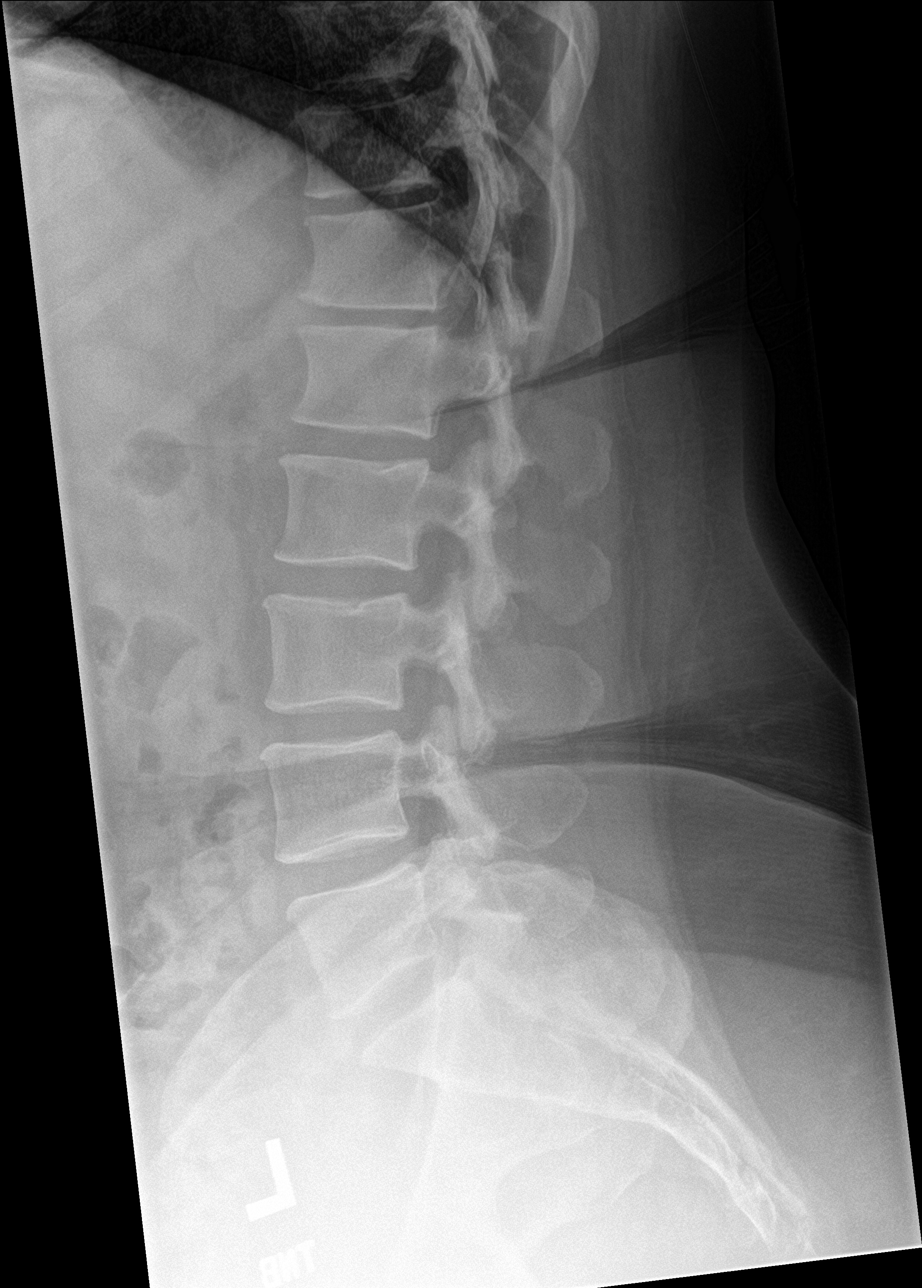

[l-spine spot]
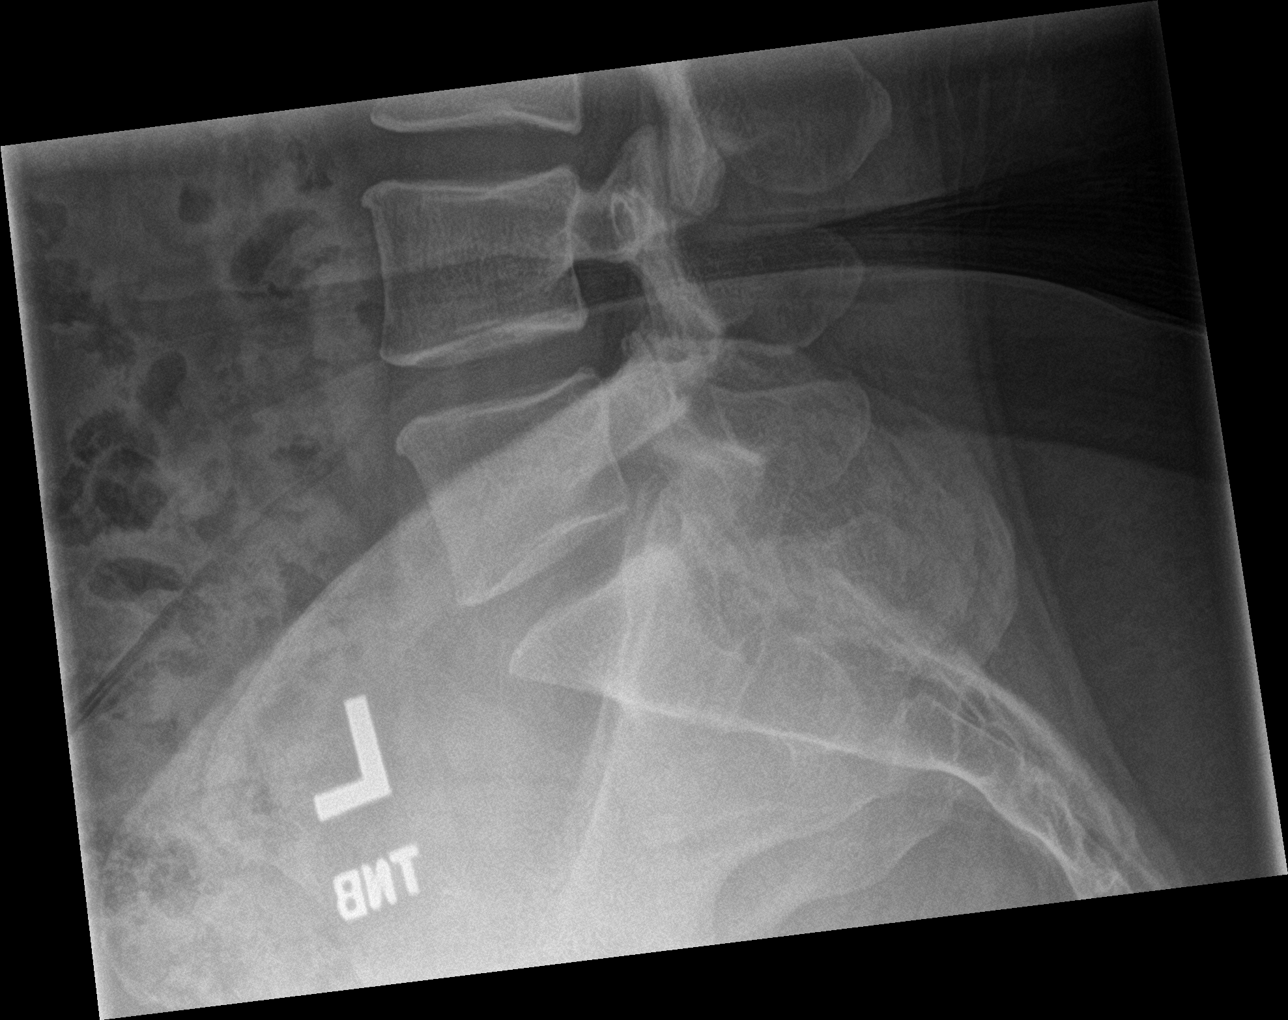

[5 of 5 positions shown; findings below may reference images not displayed]

FINDINGS: Frontal, lateral, spot lumbosacral lateral, and bilateral oblique
views were obtained. There are 5 non-rib-bearing lumbar type
vertebral bodies. There is no fracture or spondylolisthesis. Disc
spaces appear unremarkable. There is slight facet osteoarthritic
change at L5-S1 bilaterally. Facets at other levels appear
unremarkable.
IMPRESSION: Slight osteoarthritic change in the facets at L5-S1 bilaterally. No
appreciable disc space narrowing. No fracture or spondylolisthesis.

## 2018-09-19 IMAGING — DX CERVICAL SPINE - COMPLETE 4+ VIEW
5 series · 5 of 5 positions shown · non-contrast
Comparison: [DATE]

CLINICAL DATA: Cervicalgia

EXAM:
CERVICAL SPINE - COMPLETE 4+ VIEW

[c-spine lat]
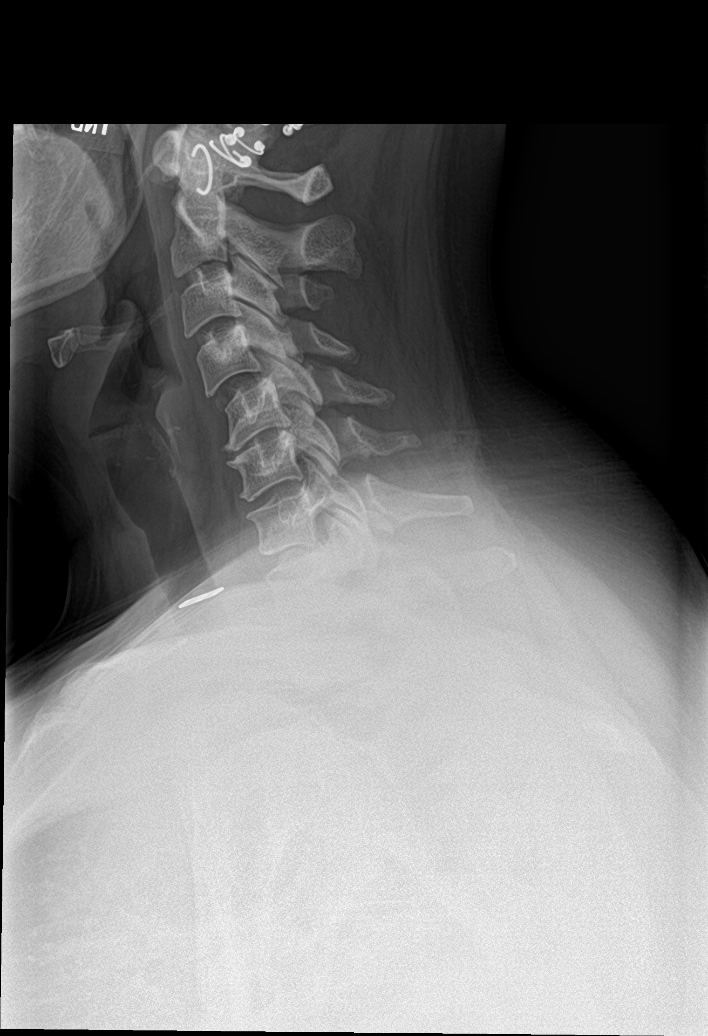

[c-spine obl (1 of 2)]
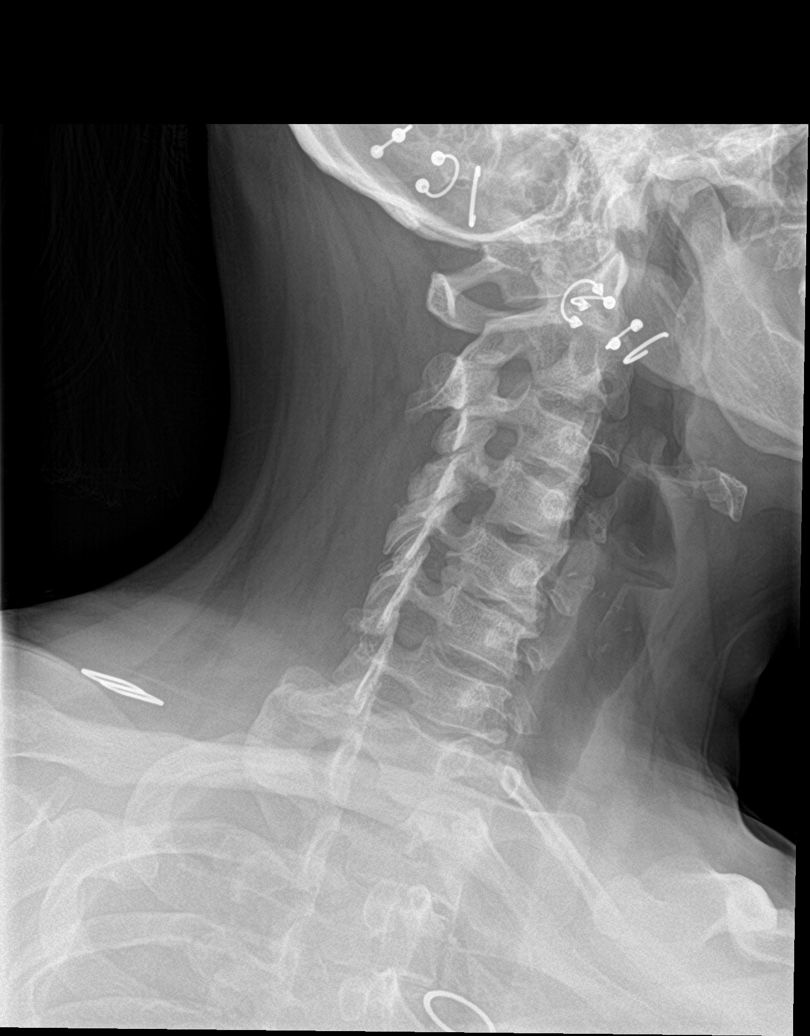

[c-spine obl (2 of 2)]
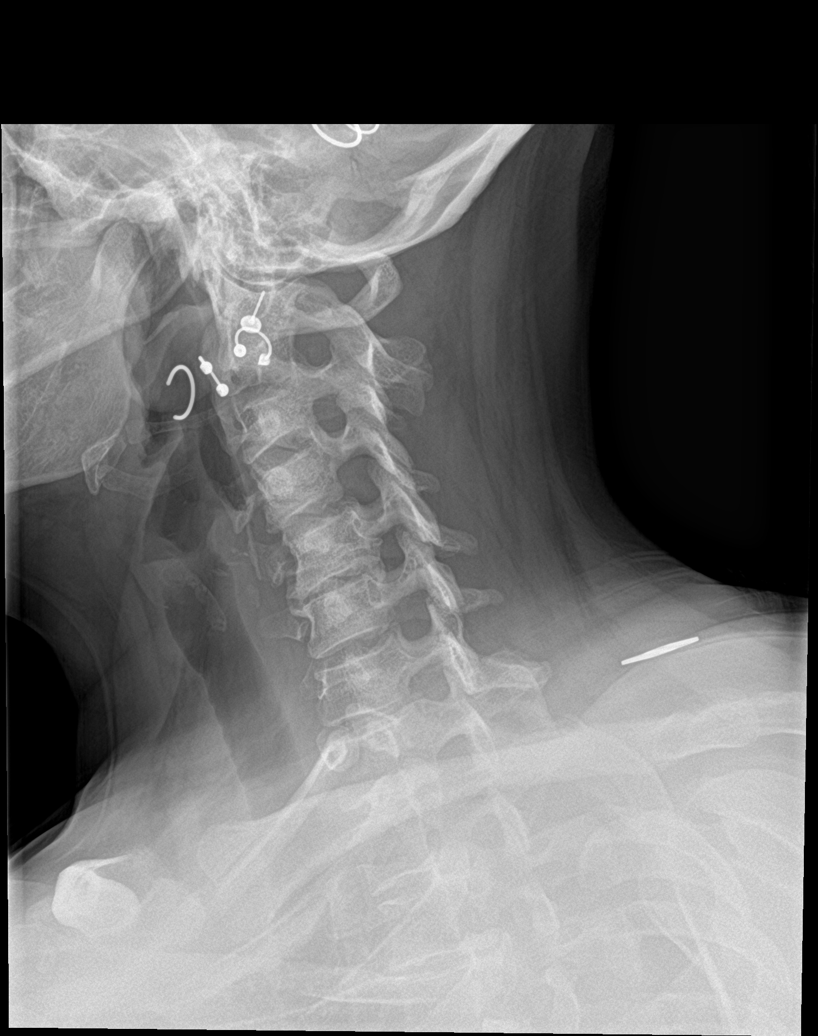

[c-spine ap]
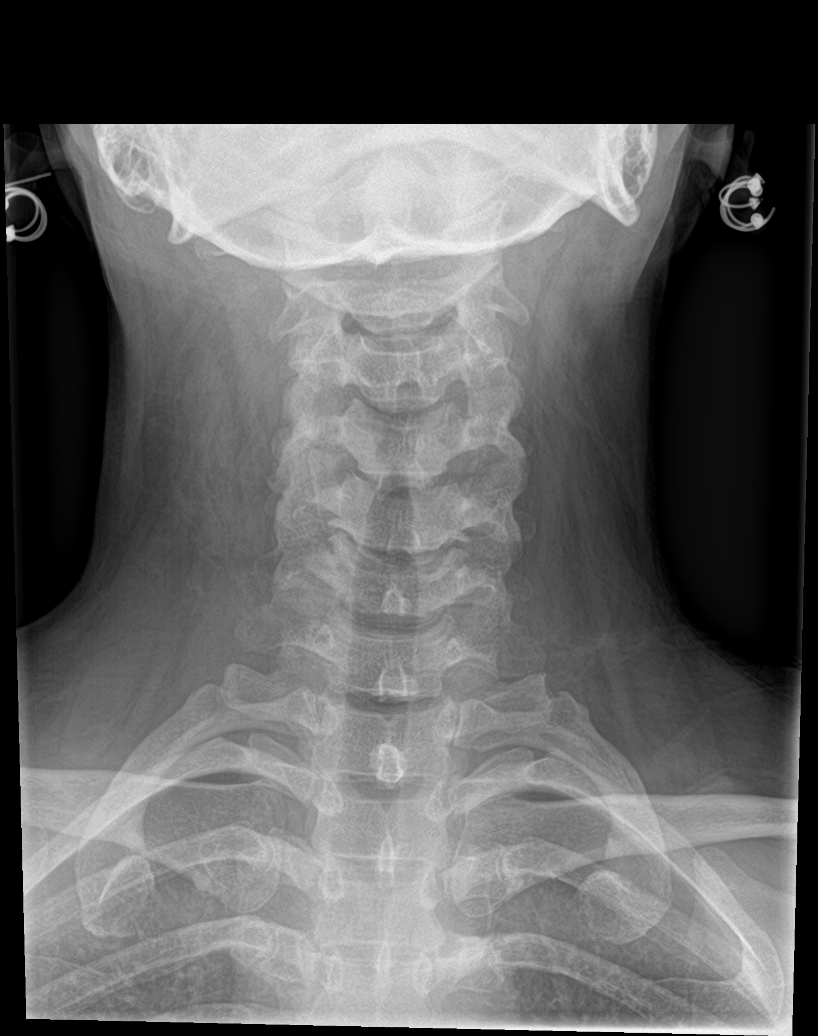

[c-spine open mouth]
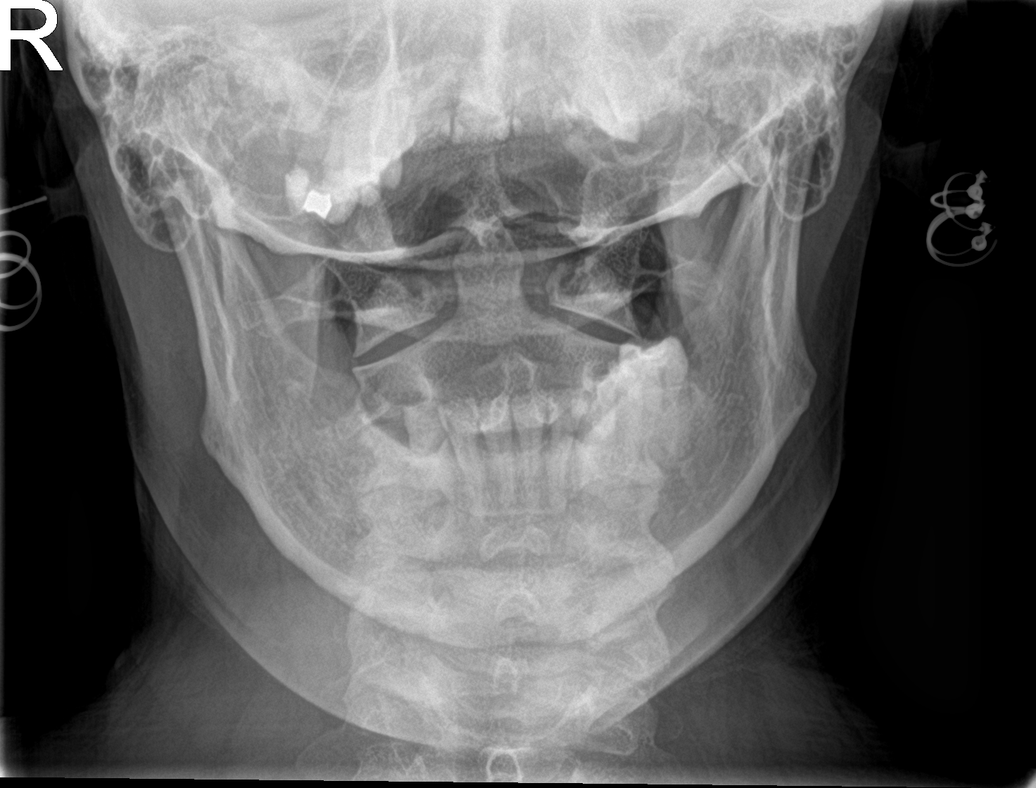

[5 of 5 positions shown; findings below may reference images not displayed]

FINDINGS: Frontal, lateral, open-mouth odontoid, and bilateral oblique views
were obtained. There is no fracture or spondylolisthesis.
Prevertebral soft tissues and predental space regions are normal.
There is moderately severe disc space narrowing at C5-6 which has
progressed since prior study. There is mild disc space narrowing at
C6-7 which has also progressed. Other disc spaces appear
unremarkable. There are anterior osteophytes at C5 and C6. There is
facet hypertrophy with exit foraminal narrowing at C4-5 and C5-6
bilaterally as well as to a lesser extent at C6-7 on the left. Exit
foraminal narrowing is most severe at C5-6 bilaterally.

Lung apices are clear.
IMPRESSION: Osteoarthritic change, most notably at C5-6, with progression
compared to prior study at C4-5, C5-6, and C6-7. No fracture or
spondylolisthesis.

## 2018-10-31 ENCOUNTER — Encounter: Payer: Self-pay | Admitting: Gynecology

## 2018-11-27 ENCOUNTER — Other Ambulatory Visit (HOSPITAL_COMMUNITY): Payer: Self-pay | Admitting: Family Medicine

## 2018-11-27 DIAGNOSIS — M5412 Radiculopathy, cervical region: Secondary | ICD-10-CM

## 2018-12-04 ENCOUNTER — Encounter (HOSPITAL_COMMUNITY): Payer: Self-pay

## 2018-12-04 ENCOUNTER — Ambulatory Visit (HOSPITAL_COMMUNITY): Payer: Medicaid Other | Attending: Family Medicine

## 2018-12-13 ENCOUNTER — Other Ambulatory Visit: Payer: Self-pay | Admitting: Women's Health

## 2019-01-02 ENCOUNTER — Emergency Department (HOSPITAL_COMMUNITY): Payer: No Typology Code available for payment source

## 2019-01-02 ENCOUNTER — Other Ambulatory Visit: Payer: Self-pay

## 2019-01-02 ENCOUNTER — Emergency Department (HOSPITAL_COMMUNITY)
Admission: EM | Admit: 2019-01-02 | Discharge: 2019-01-02 | Disposition: A | Discharge status: Home / Self Care | Payer: No Typology Code available for payment source | Attending: Emergency Medicine | Admitting: Emergency Medicine

## 2019-01-02 ENCOUNTER — Encounter (HOSPITAL_COMMUNITY): Payer: Self-pay | Admitting: Emergency Medicine

## 2019-01-02 DIAGNOSIS — J449 Chronic obstructive pulmonary disease, unspecified: Secondary | ICD-10-CM | POA: Insufficient documentation

## 2019-01-02 DIAGNOSIS — Z87891 Personal history of nicotine dependence: Secondary | ICD-10-CM | POA: Diagnosis not present

## 2019-01-02 DIAGNOSIS — Z79899 Other long term (current) drug therapy: Secondary | ICD-10-CM | POA: Insufficient documentation

## 2019-01-02 DIAGNOSIS — Z7982 Long term (current) use of aspirin: Secondary | ICD-10-CM | POA: Insufficient documentation

## 2019-01-02 DIAGNOSIS — M545 Low back pain: Secondary | ICD-10-CM | POA: Insufficient documentation

## 2019-01-02 LAB — PREGNANCY, URINE: Preg Test, Ur: NEGATIVE

## 2019-01-02 LAB — POCT PREGNANCY, URINE: Preg Test, Ur: NEGATIVE
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
## 2019-01-02 MED ORDER — METHOCARBAMOL 500 MG PO TABS: 500.0000 mg | ORAL_TABLET | Freq: Two times a day (BID) | ORAL | 0 refills | Status: DC
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
## 2019-01-02 NOTE — Discharge Instructions (Signed)
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
## 2019-01-10 ENCOUNTER — Telehealth: Payer: Self-pay | Admitting: *Deleted

## 2019-01-10 NOTE — Telephone Encounter (Signed)
Pt needs enough birth control to last until her 03/07/19 appt. Thanks!!

## 2019-01-11 ENCOUNTER — Other Ambulatory Visit: Payer: Self-pay | Admitting: Advanced Practice Midwife

## 2019-01-11 MED ORDER — NORETHINDRONE 0.35 MG PO TABS: 1.0000 | ORAL_TABLET | Freq: Every day | ORAL | 2 refills | Status: DC

## 2019-01-16 ENCOUNTER — Ambulatory Visit (HOSPITAL_COMMUNITY)
Admission: RE | Admit: 2019-01-16 | Discharge: 2019-01-16 | Disposition: A | Discharge status: Home / Self Care | Payer: Medicaid Other | Source: Ambulatory Visit | Attending: Family Medicine | Admitting: Family Medicine

## 2019-01-16 ENCOUNTER — Other Ambulatory Visit: Payer: Self-pay

## 2019-01-16 DIAGNOSIS — M5412 Radiculopathy, cervical region: Secondary | ICD-10-CM | POA: Insufficient documentation

## 2019-01-16 IMAGING — MR MR CERVICAL SPINE W/O CM
4 of 5 series · 23 of 48 positions shown · non-contrast
Comparison: Radiography [DATE]

CLINICAL DATA: Neck pain radiating down the left arm over the last
year.

EXAM:
MRI CERVICAL SPINE WITHOUT CONTRAST
TECHNIQUE: Multiplanar, multisequence MR imaging of the cervical spine was
performed. No intravenous contrast was administered.

[Series 3: T2 · sagittal · 3.0mm · 0.68mm/px · 8 of 15 slices shown (1 of 2)]
[im 1/15]
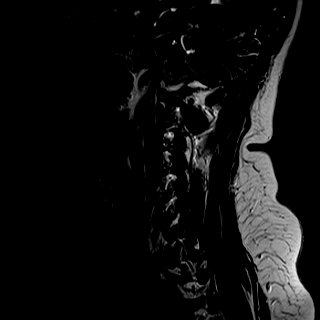
[im 3/15]
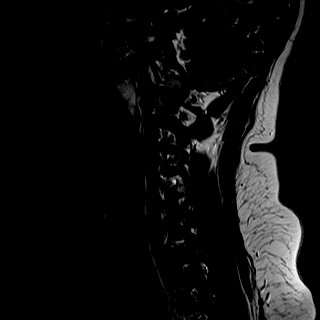
[im 5/15]
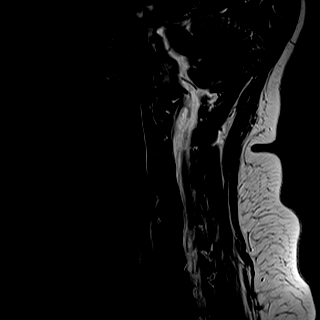
[im 7/15]
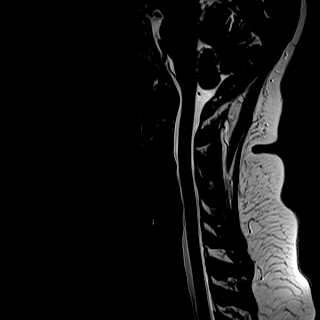
[im 9/15]
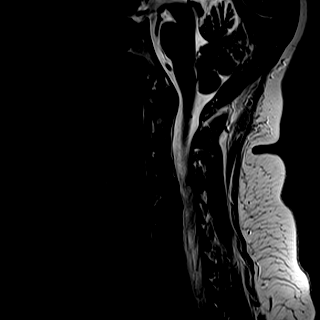
[im 11/15]
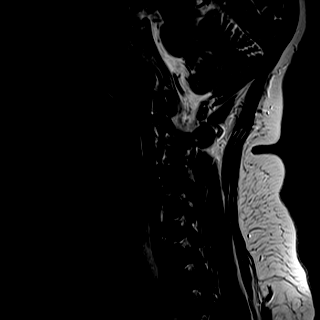
[im 13/15]
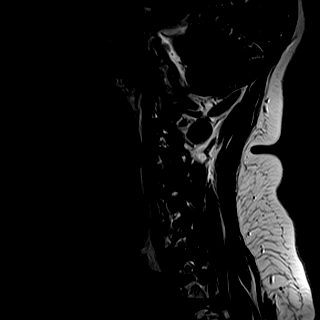
[im 15/15]
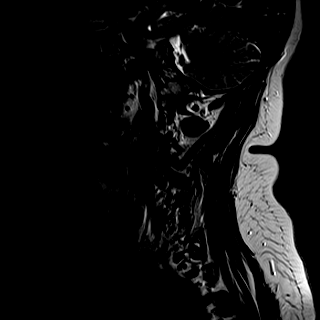

[Series 4: FLAIR · sagittal · 3.0mm · 0.68mm/px · 3 of 15 slices shown]
[im 3/15]
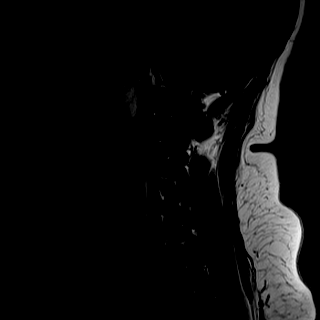
[im 8/15]
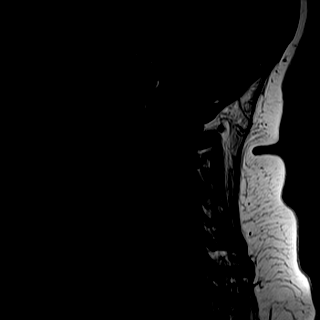
[im 12/15]
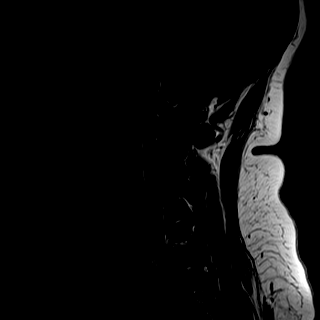

[Series 5: STIR · sagittal · 3.0mm · 0.34mm/px · 3 of 15 slices shown]
[im 3/15]
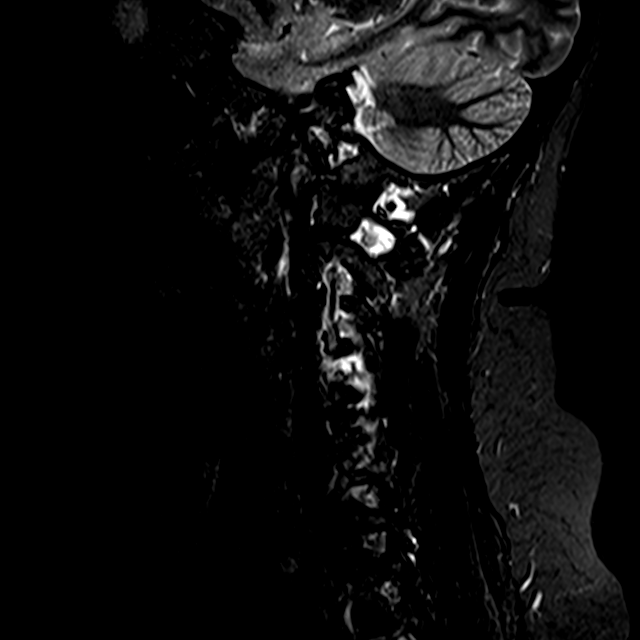
[im 8/15]
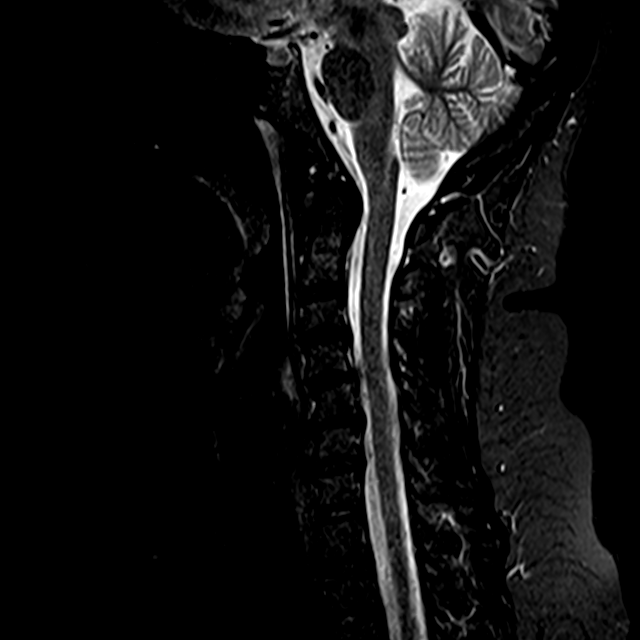
[im 12/15]
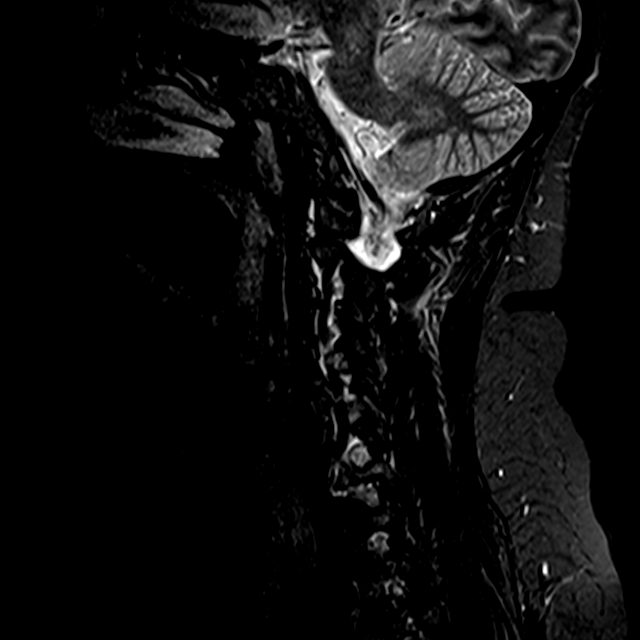

[Series 6: T2 · axial · 3.0mm · 0.20mm/px · z∈[-64,+22]mm · 9 of 28 slices shown (2 of 2)]
[im 1/28]
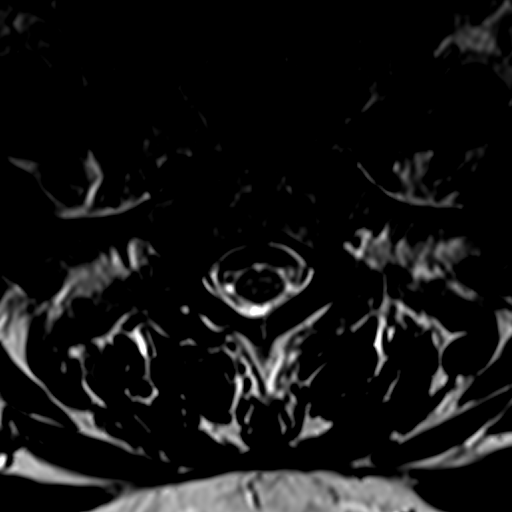
[im 5/28]
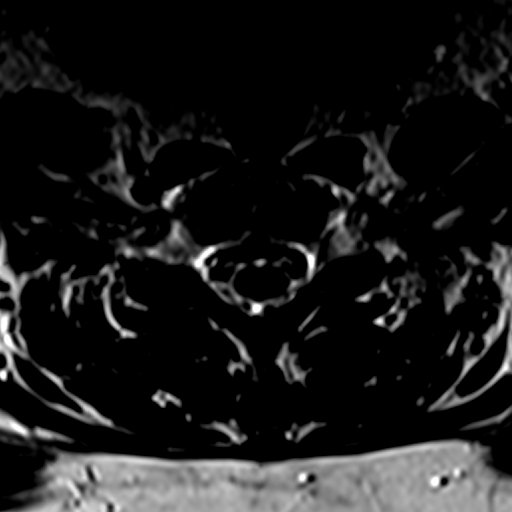
[im 10/28]
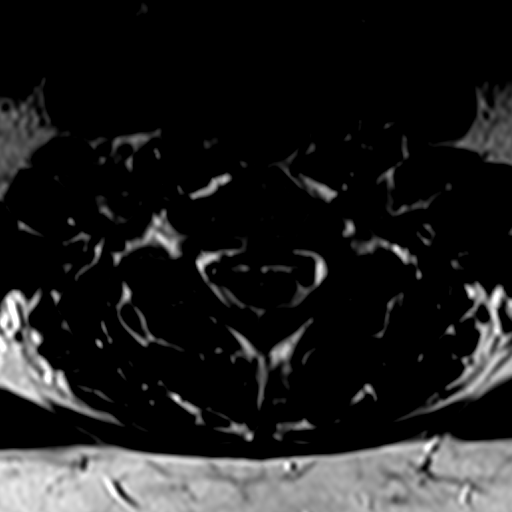
[im 12/28]
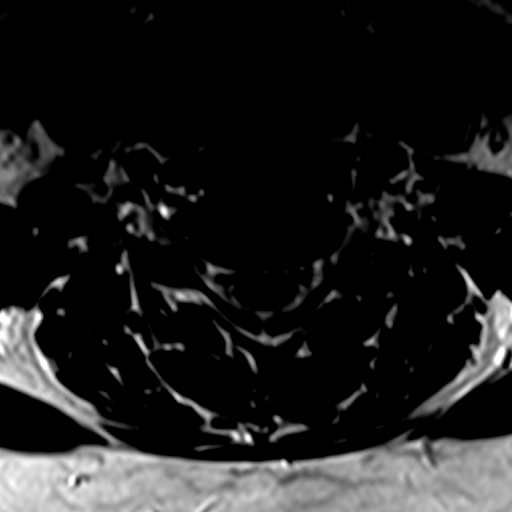
[im 14/28]
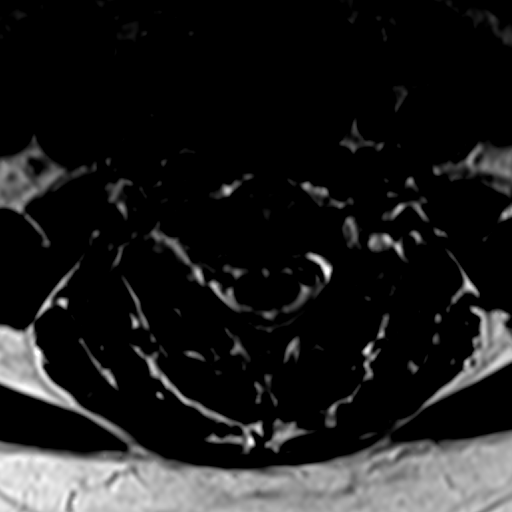
[im 16/28]
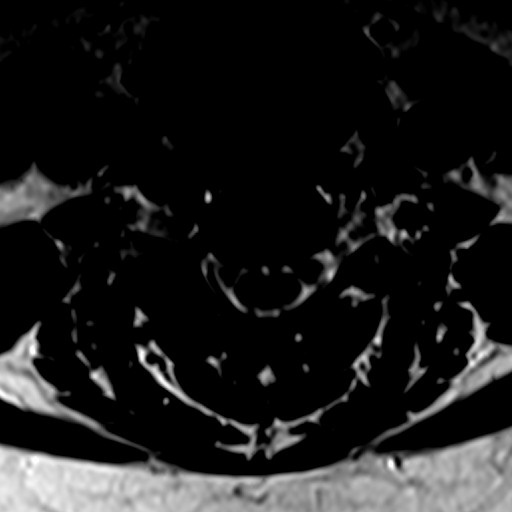
[im 19/28]
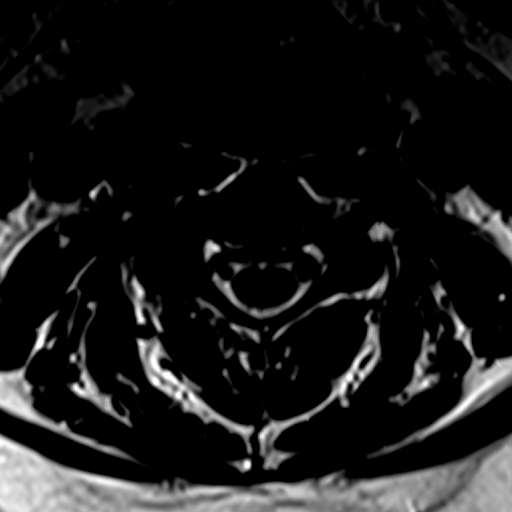
[im 23/28]
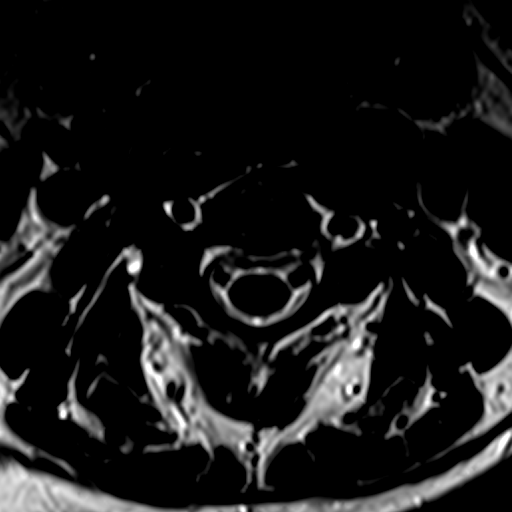
[im 28/28]
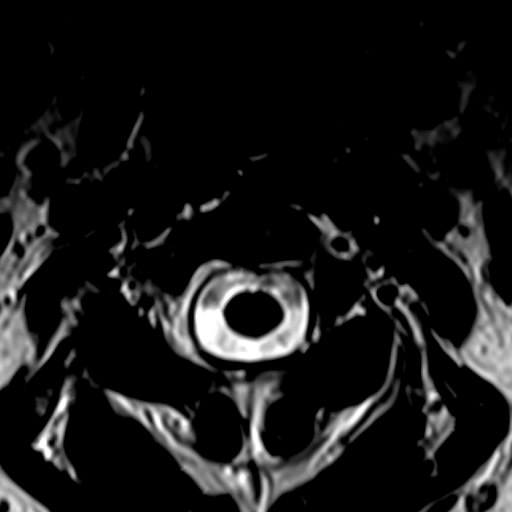

[23 of 48 positions shown; findings below may reference images not displayed]

FINDINGS: Alignment: Straightening of the normal cervical lordosis.

Vertebrae: No fracture or primary bone lesion.

Cord: No cord compression or primary cord lesion.

Posterior Fossa, vertebral arteries, paraspinal tissues: Normal

Disc levels:

Foramen magnum and C1-2 are normal.

C2-3: Normal appearance of the disc. Mild facet osteoarthritis on
the right without edema or encroachment upon the neural structures.

C3-4: Endplate osteophytes and mild bulging of the disc. Facet
osteoarthritis on the right. No canal or foraminal stenosis.

C4-5: Endplate osteophytes and mild bulging of the disc. Facet
osteoarthritis on the right. Foraminal stenosis on the right that
could compress the right C5 nerve.

C5-6: Spondylosis with endplate osteophytes and bulging of the disc.
Foraminal encroachment by osteophytes right worse than left. Right
C6 nerve compression seems possible. There is only mild foraminal
encroachment on the left.

C6-7: Mild bulging of the disc.  No canal or foraminal stenosis.

C7-T1: Normal interspace.
IMPRESSION: Right-sided cervical facet degeneration at C3-4, C4-5 and a lesser
extent C2-3. Degenerative cervical spondylosis from C3-4 through
C6-7. Foraminal narrowing that could cause neural compression on the
right at C4-5 and C5-6. I do not see left-sided foraminal narrowing
that would definitely cause neural compression. There is mild
foraminal narrowing on the left at C5-6, as the only real left-sided
finding in this case.

## 2019-02-27 ENCOUNTER — Other Ambulatory Visit: Payer: Self-pay | Admitting: Nurse Practitioner

## 2019-02-27 DIAGNOSIS — M542 Cervicalgia: Secondary | ICD-10-CM

## 2019-03-05 ENCOUNTER — Other Ambulatory Visit: Payer: Self-pay

## 2019-03-05 ENCOUNTER — Ambulatory Visit
Admission: RE | Admit: 2019-03-05 | Discharge: 2019-03-05 | Disposition: A | Discharge status: Home / Self Care | Payer: Medicaid Other | Source: Ambulatory Visit | Attending: Nurse Practitioner | Admitting: Nurse Practitioner

## 2019-03-05 DIAGNOSIS — M542 Cervicalgia: Secondary | ICD-10-CM

## 2019-03-05 IMAGING — XA DG INJECT/[PERSON_NAME] INC NEEDLE/CATH/PLC EPI/CERV/THOR W/IMG
2 series · 2 of 2 positions shown · non-contrast
Comparison: none

CLINICAL DATA: Bilateral neck pain. Left upper extremity
radiculopathy. Displacement of the cervical discs C3-4, C4-5, C5-6,
and C6-7

[Series 1: ortho standard · 1 of 1 slices shown (1 of 2)]
[im 1/1]
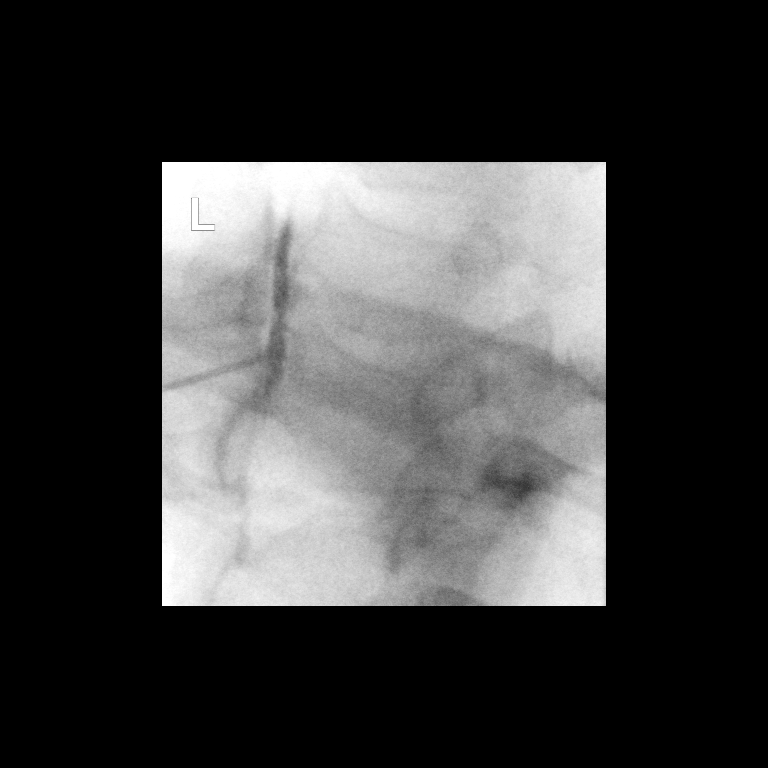

[Series 2: ortho standard · 1 of 1 slices shown (2 of 2)]
[im 1/1]
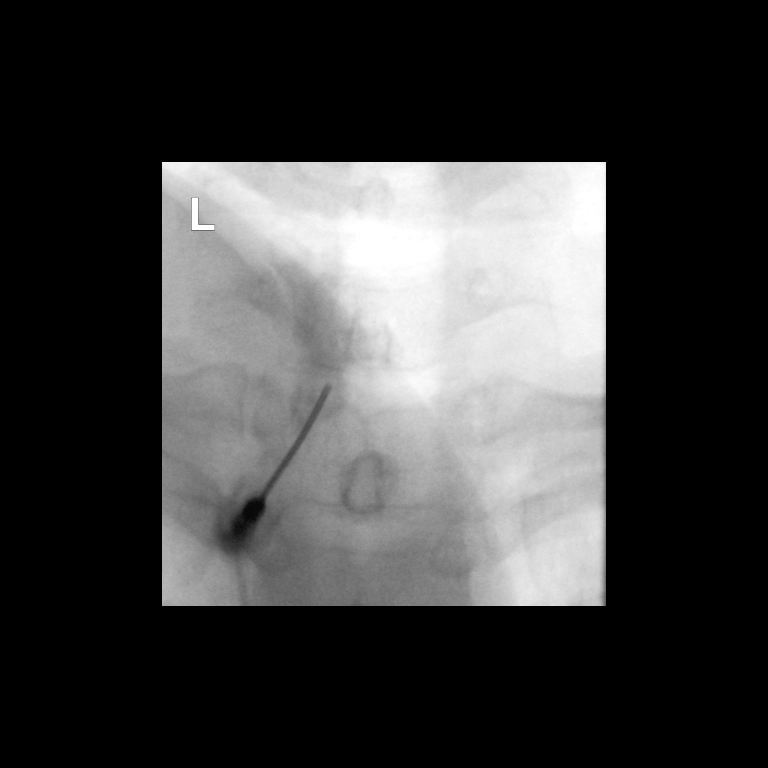

[2 of 2 positions shown; findings below may reference images not displayed]

FLUOROSCOPY TIME:  Radiation Exposure Index (as provided by the
fluoroscopic device): 22.66 uGy*m2

PROCEDURE:
CERVICAL EPIDURAL INJECTION

An interlaminar approach was performed on the left at C7-T1. A 20
gauge epidural needle was advanced using loss-of-resistance
technique.

DIAGNOSTIC EPIDURAL INJECTION

Injection of Isovue-M 300 shows a good epidural pattern with spread
above and below the level of needle placement, primarily on the
left. No vascular opacification is seen. THERAPEUTIC

EPIDURAL INJECTION

1.5 ml of Kenalog 40 mixed with 1 ml of 1% Lidocaine and 2 ml of
normal saline were then instilled. The procedure was well-tolerated,
and the patient was discharged thirty minutes following the
injection in good condition.
IMPRESSION: Technically successful first epidural injection on the left at
C7-T1.

## 2019-03-05 MED ORDER — TRIAMCINOLONE ACETONIDE 40 MG/ML IJ SUSP (RADIOLOGY)
60.0000 mg | Freq: Once | INTRAMUSCULAR | Status: AC
  Administered 2019-03-05: 60 mg via EPIDURAL

## 2019-03-05 MED ORDER — IOPAMIDOL (ISOVUE-M 300) INJECTION 61%
1.0000 mL | Freq: Once | INTRAMUSCULAR | Status: AC | PRN
  Administered 2019-03-05: 1 mL via EPIDURAL

## 2019-03-05 NOTE — Discharge Instructions (Signed)

## 2019-03-06 ENCOUNTER — Telehealth: Payer: Self-pay | Admitting: Advanced Practice Midwife

## 2019-03-06 NOTE — Telephone Encounter (Signed)

## 2019-03-07 ENCOUNTER — Ambulatory Visit (INDEPENDENT_AMBULATORY_CARE_PROVIDER_SITE_OTHER): Payer: Medicaid Other | Admitting: Advanced Practice Midwife

## 2019-03-07 ENCOUNTER — Encounter: Payer: Self-pay | Admitting: Advanced Practice Midwife

## 2019-03-07 ENCOUNTER — Other Ambulatory Visit (HOSPITAL_COMMUNITY)
Admission: RE | Admit: 2019-03-07 | Discharge: 2019-03-07 | Disposition: A | Discharge status: Home / Self Care | Payer: Medicaid Other | Source: Ambulatory Visit | Attending: Advanced Practice Midwife | Admitting: Advanced Practice Midwife

## 2019-03-07 ENCOUNTER — Other Ambulatory Visit: Payer: Self-pay

## 2019-03-07 VITALS — BP 138/84 | HR 96 | Ht 64.0 in | Wt 215.8 lb

## 2019-03-07 DIAGNOSIS — Z0000 Encounter for general adult medical examination without abnormal findings: Secondary | ICD-10-CM

## 2019-03-07 DIAGNOSIS — Z01419 Encounter for gynecological examination (general) (routine) without abnormal findings: Secondary | ICD-10-CM | POA: Diagnosis not present

## 2019-03-07 MED ORDER — NORETHINDRONE 0.35 MG PO TABS: 1.0000 | ORAL_TABLET | Freq: Every day | ORAL | 11 refills | Status: DC

## 2019-03-07 NOTE — Progress Notes (Signed)
WELL-WOMAN EXAMINATION Patient name: Michelle Payne MRN [id number]  Date of birth: 11-09-1975 Chief Complaint:   Gynecologic Exam  History of Present Illness:   Michelle Payne is a 44 y.o. [phone] Caucasian female being seen today for a routine well-woman exam.  Current complaints: hair loss that started a little less than a year ago; doing well on Micronor- would like to continue; had one month recently with BTB; has been struggling with back pain and has recently rec'd injections  PCP: Dr Sudie Bailey      does not desire labs Patient's last menstrual period was 02/10/2019 (exact date). The current method of family planning is oral progesterone-only contraceptive.  Last pap Jan 2020. Results were: normal. H/O abnormal pap: No Last mammogram: has not had initial screening one. Family h/o breast cancer: No Last colonoscopy: never. Family h/o colorectal cancer: No Review of Systems:   Pertinent items are noted in HPI Denies any headaches, blurred vision, fatigue, shortness of breath, chest pain, abdominal pain, abnormal vaginal discharge/itching/odor/irritation, problems with periods, bowel movements, urination, or intercourse unless otherwise stated above. Pertinent History Reviewed:  Reviewed past medical,surgical, social and family history.  Reviewed problem list, medications and allergies. Physical Assessment:   Vitals:   03/07/19 1331  BP: 138/84  Pulse: 96  Weight: 215 lb 12.8 oz (97.9 kg)  Height: 5\' 4"  (1.626 m)  Body mass index is 37.04 kg/m.        Physical Examination:   General appearance - well appearing, and in no distress  Mental status - alert, oriented to person, place, and time  Psych:  She has a normal mood and affect  Skin - warm and dry, normal color, no suspicious lesions noted  Chest - effort normal, all lung fields clear to auscultation bilaterally  Heart - normal rate and regular rhythm  Neck:  midline trachea, no thyromegaly or nodules  Breasts - breasts  appear normal, no suspicious masses, no skin or nipple changes or  axillary nodes  Abdomen - soft, nontender, nondistended, no masses or organomegaly  Pelvic - VULVA: normal appearing vulva with no masses, tenderness or lesions  VAGINA: normal appearing vagina with normal color and discharge, no lesions  CERVIX: normal appearing cervix without discharge or lesions, no CMT  Thin prep pap is done with HR HPV cotesting  UTERUS: uterus is felt to be normal size, shape, consistency and nontender   ADNEXA: No adnexal masses or tenderness noted.  Extremities:  No swelling or varicosities noted  Chaperone: Peggy Dones    No results found for this or any previous visit (from the past 24 hour(s)).  Assessment & Plan:  1) Well-Woman Exam  2) Past due for screening mammo> will call to schedule ASAP  3) Hair loss> declines labs today as has PCP appt next week; will get TSH with those labs  Labs/procedures today: Pap with GC/chlam  Mammogram ASAP  Colonoscopy age 76-50 or sooner if problems  No orders of the defined types were placed in this encounter.   Meds:  Meds ordered this encounter  Medications  . norethindrone (MICRONOR) 0.35 MG tablet    Sig: Take 1 tablet (0.35 mg total) by mouth daily.    Dispense:  1 Package    Refill:  11    Order Specific Question:   Supervising Provider    Answer:   56-49 [[id number]]    Follow-up: Return in about 1 year (around 03/06/2020) for Physical.  03/08/2020 [facility] 03/07/2019 2:44 PM

## 2019-03-12 LAB — CYTOLOGY - PAP
Adequacy: ABSENT
Chlamydia: NEGATIVE
Comment: NEGATIVE
Comment: NEGATIVE
Comment: NEGATIVE
Comment: NORMAL
Diagnosis: NEGATIVE
HPV 16: NEGATIVE
HPV 18 / 45: NEGATIVE
High risk HPV: POSITIVE — AB
Neisseria Gonorrhea: NEGATIVE

## 2019-03-14 ENCOUNTER — Telehealth: Payer: Self-pay | Admitting: *Deleted

## 2019-03-14 NOTE — Telephone Encounter (Signed)
Patient informed pap normal but did show HPV which will require follow-up in 1 year.  All questions answered.

## 2019-05-18 ENCOUNTER — Other Ambulatory Visit: Payer: Self-pay | Admitting: Nurse Practitioner

## 2019-05-18 DIAGNOSIS — M542 Cervicalgia: Secondary | ICD-10-CM

## 2019-05-23 ENCOUNTER — Other Ambulatory Visit (HOSPITAL_COMMUNITY): Payer: Self-pay | Admitting: Nurse Practitioner

## 2019-05-23 DIAGNOSIS — Z1231 Encounter for screening mammogram for malignant neoplasm of breast: Secondary | ICD-10-CM

## 2019-05-31 ENCOUNTER — Other Ambulatory Visit: Payer: Self-pay

## 2019-05-31 ENCOUNTER — Ambulatory Visit
Admission: RE | Admit: 2019-05-31 | Discharge: 2019-05-31 | Disposition: A | Discharge status: Home / Self Care | Payer: Medicaid Other | Source: Ambulatory Visit | Attending: Nurse Practitioner | Admitting: Nurse Practitioner

## 2019-05-31 DIAGNOSIS — M542 Cervicalgia: Secondary | ICD-10-CM

## 2019-05-31 IMAGING — XA DG INJECT/[PERSON_NAME] INC NEEDLE/CATH/PLC EPI/CERV/THOR W/IMG
2 series · 2 of 2 positions shown · non-contrast
Comparison: none

CLINICAL DATA: Cervical spondylosis without myelopathy. Partial
improvement following the first epidural injection in [DATE].
Significant sustained improvement of left arm pain. Persistent pain
in the neck and shoulders which is now worse on the right.

[Series 1: ortho adipose · 1 of 1 slices shown (1 of 2)]
[im 1/1]
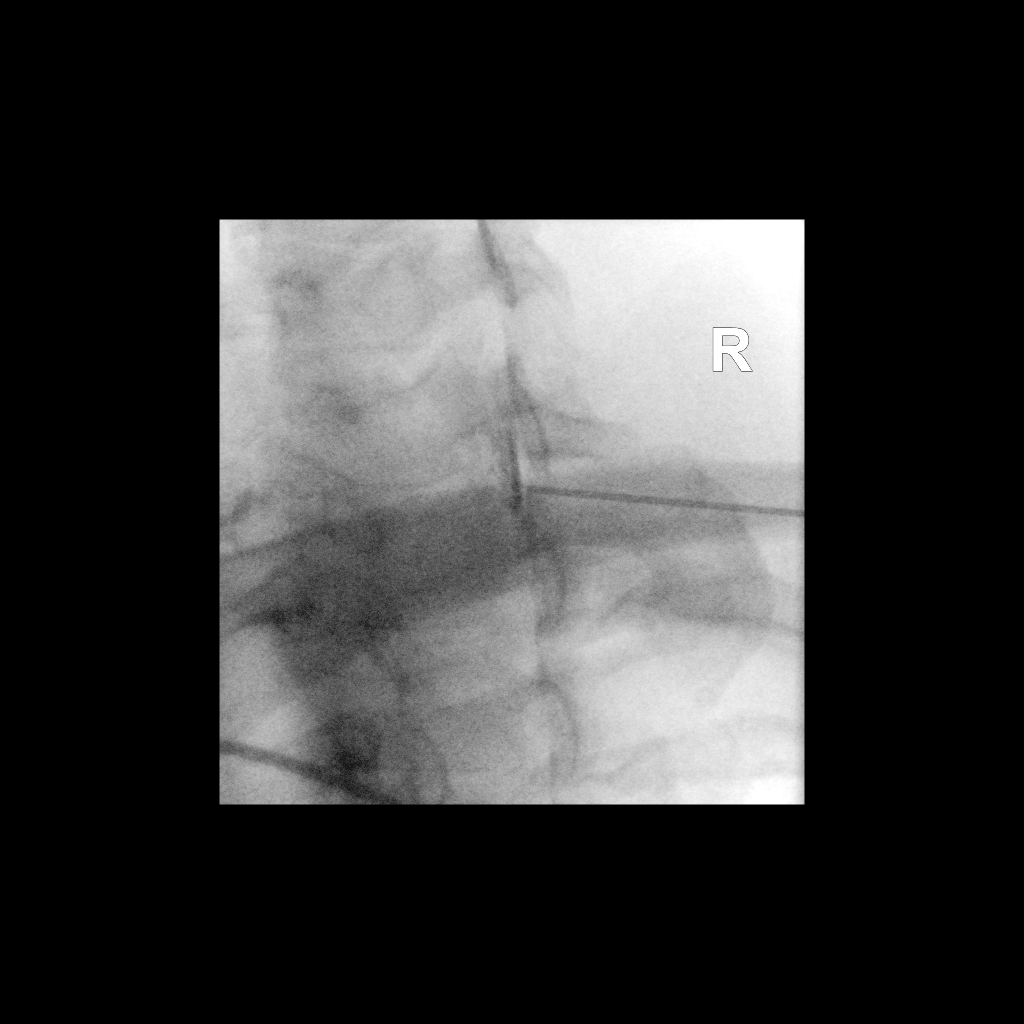

[Series 2: ortho adipose · 1 of 1 slices shown (2 of 2)]
[im 1/1]
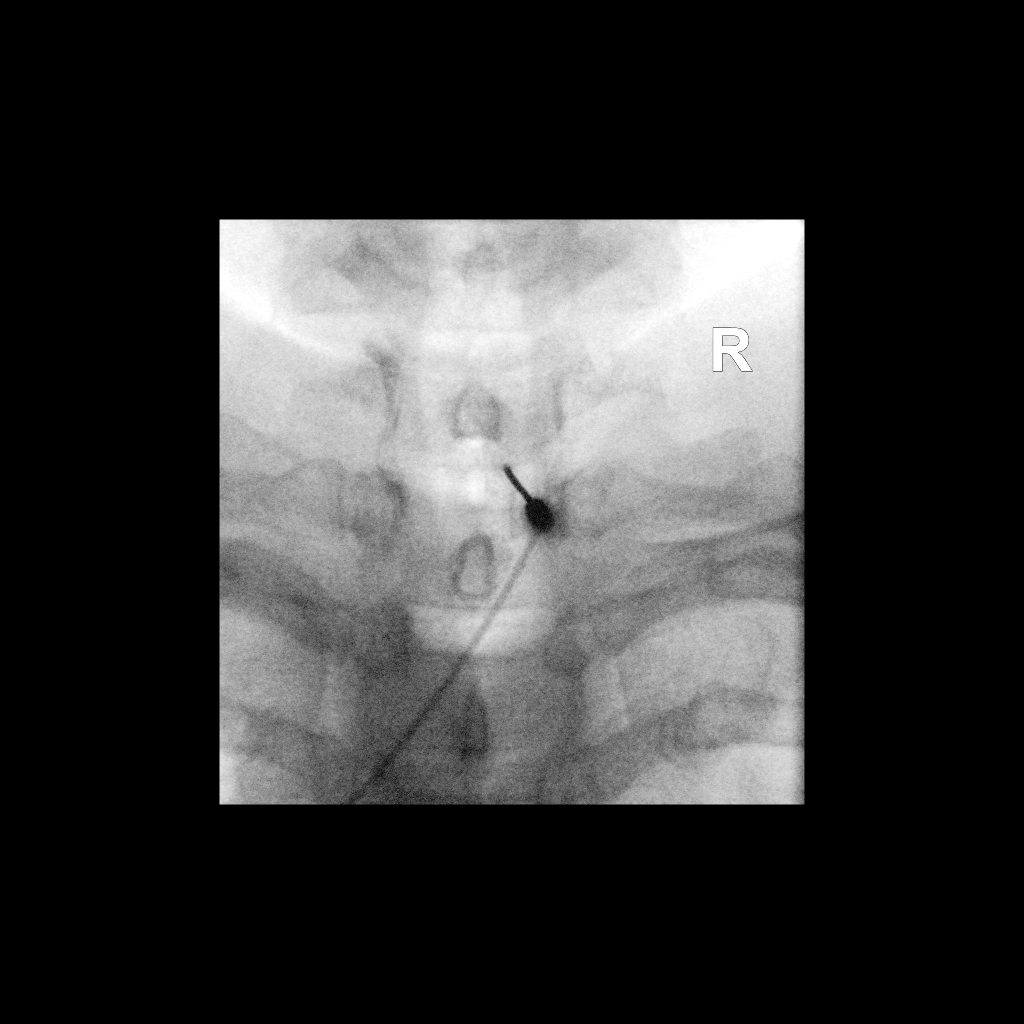

[2 of 2 positions shown; findings below may reference images not displayed]

FLUOROSCOPY TIME:  Fluoroscopy Time: 19 seconds

Radiation Exposure Index: 19.14 microGray*m^2

PROCEDURE:
The procedure, risks, benefits, and alternatives were explained to
the patient. Questions regarding the procedure were encouraged and
answered. The patient understands and consents to the procedure.

CERVICAL EPIDURAL INJECTION

An interlaminar approach was performed on the right at C7-T1. A
inch 20 gauge epidural needle was advanced using loss-of-resistance
technique.

DIAGNOSTIC EPIDURAL INJECTION

Injection of Isovue-M 300 shows a good epidural pattern with spread
above and below the level of needle placement, primarily on the
right. No vascular opacification is seen.

THERAPEUTIC EPIDURAL INJECTION

1.5 ml of Kenalog 40 mixed with 2 ml of normal saline were then
instilled. The procedure was well-tolerated, and the patient was
discharged thirty minutes following the injection in good condition.
IMPRESSION: Technically successful interlaminar epidural injection on the right
at C7-T1.

## 2019-05-31 MED ORDER — IOPAMIDOL (ISOVUE-M 300) INJECTION 61%
1.0000 mL | Freq: Once | INTRAMUSCULAR | Status: AC
  Administered 2019-05-31: 1 mL via EPIDURAL

## 2019-05-31 MED ORDER — TRIAMCINOLONE ACETONIDE 40 MG/ML IJ SUSP (RADIOLOGY)
60.0000 mg | Freq: Once | INTRAMUSCULAR | Status: AC
  Administered 2019-05-31: 60 mg via EPIDURAL

## 2019-05-31 NOTE — Discharge Instructions (Signed)

## 2019-06-01 ENCOUNTER — Other Ambulatory Visit: Payer: Self-pay | Admitting: Nurse Practitioner

## 2019-06-01 ENCOUNTER — Other Ambulatory Visit (HOSPITAL_COMMUNITY): Payer: Self-pay | Admitting: Nurse Practitioner

## 2019-06-01 DIAGNOSIS — M5442 Lumbago with sciatica, left side: Secondary | ICD-10-CM

## 2019-06-27 ENCOUNTER — Ambulatory Visit (HOSPITAL_COMMUNITY)
Admission: RE | Admit: 2019-06-27 | Discharge: 2019-06-27 | Disposition: A | Discharge status: Home / Self Care | Payer: Medicaid Other | Source: Ambulatory Visit | Attending: Nurse Practitioner | Admitting: Nurse Practitioner

## 2019-06-27 ENCOUNTER — Other Ambulatory Visit: Payer: Self-pay

## 2019-06-27 DIAGNOSIS — M5442 Lumbago with sciatica, left side: Secondary | ICD-10-CM | POA: Diagnosis not present

## 2019-06-27 IMAGING — MR MR LUMBAR SPINE W/O CM
4 of 5 series · 12 of 48 positions shown · non-contrast
Comparison: Prior radiograph from [DATE].

CLINICAL DATA: Initial evaluation for severe lower back pain
radiating into the left lower extremity for more than 1 year.

EXAM:
MRI LUMBAR SPINE WITHOUT CONTRAST
TECHNIQUE: Multiplanar, multisequence MR imaging of the lumbar spine was
performed. No intravenous contrast was administered.

[Series 3: T2 · sagittal · 4.0mm · 0.43mm/px · 3 of 15 slices shown (1 of 3)]
[im 1/15]
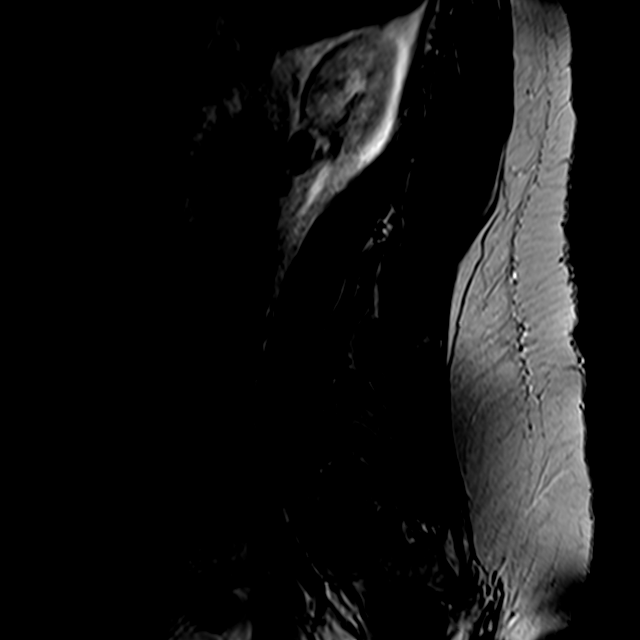
[im 8/15]
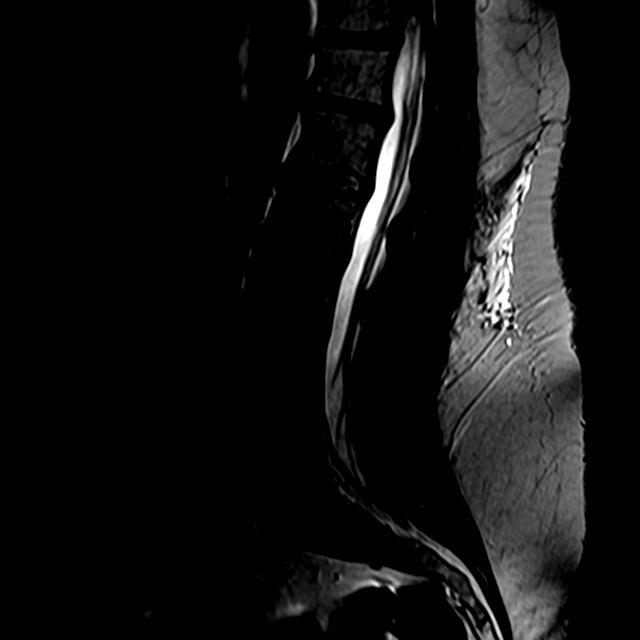
[im 15/15]
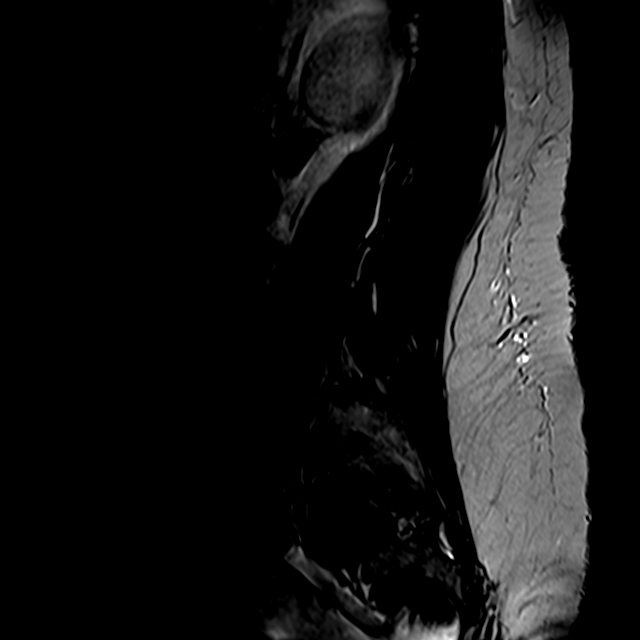

[Series 4: T1 · sagittal · 4.0mm · 0.43mm/px · 3 of 15 slices shown]
[im 1/15]
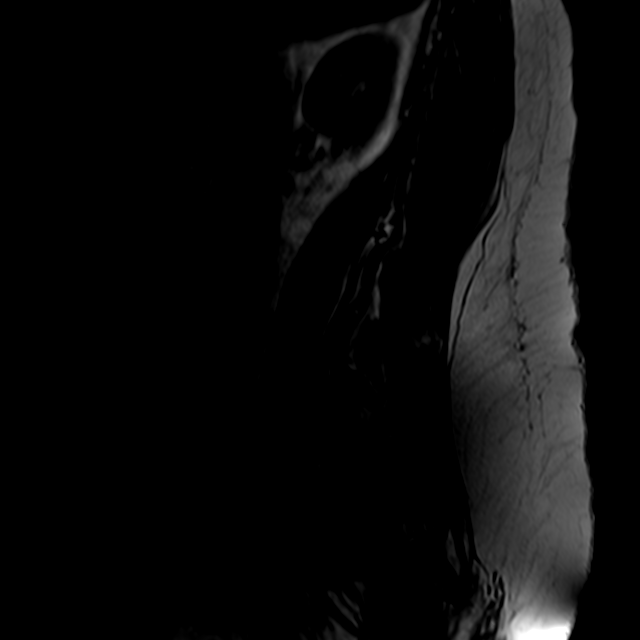
[im 8/15]
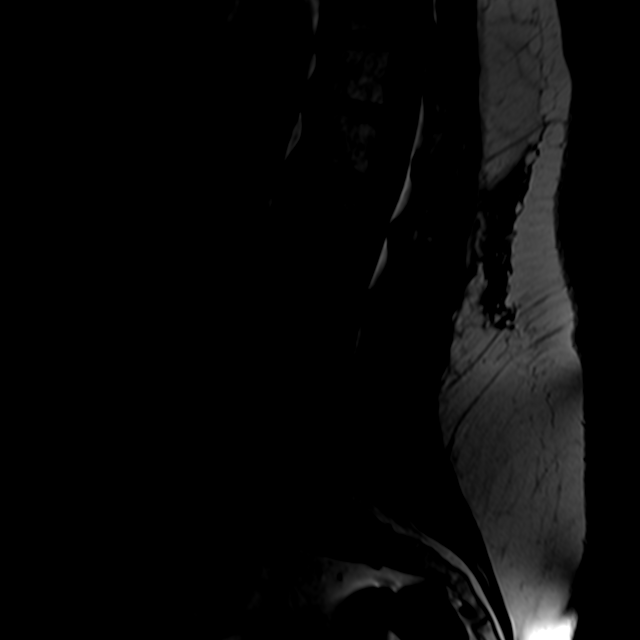
[im 15/15]
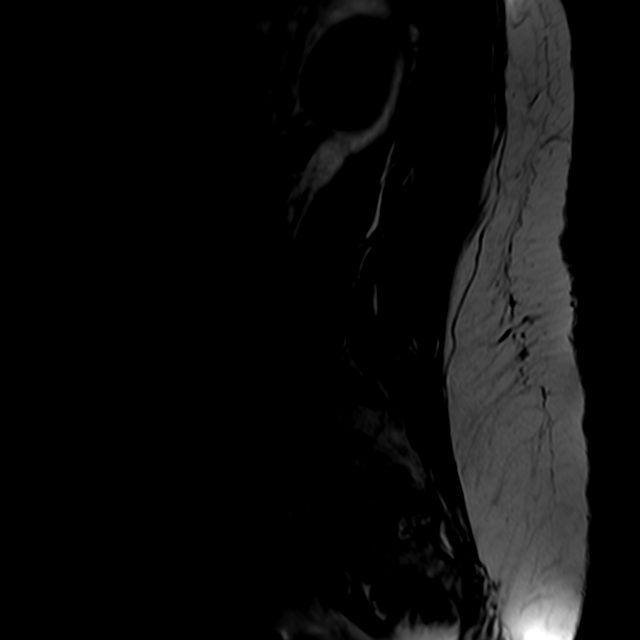

[Series 5: T2 · sagittal · 4.0mm · 0.43mm/px · 3 of 18 slices shown (2 of 3)]
[im 4/18]
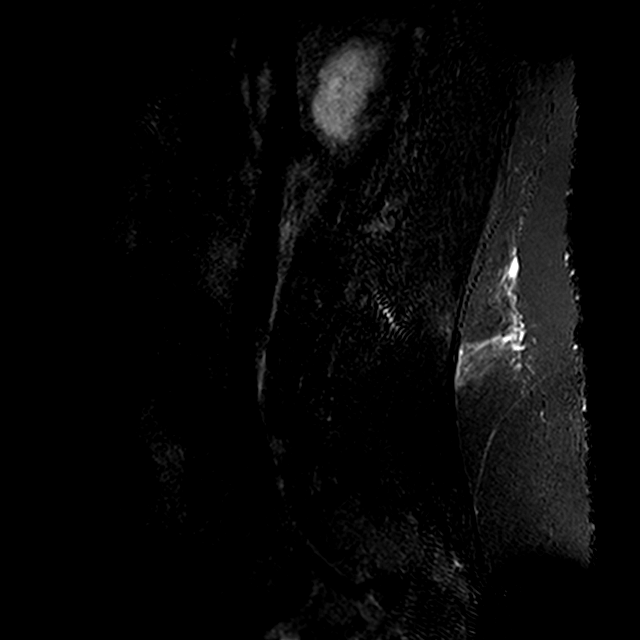
[im 11/18]
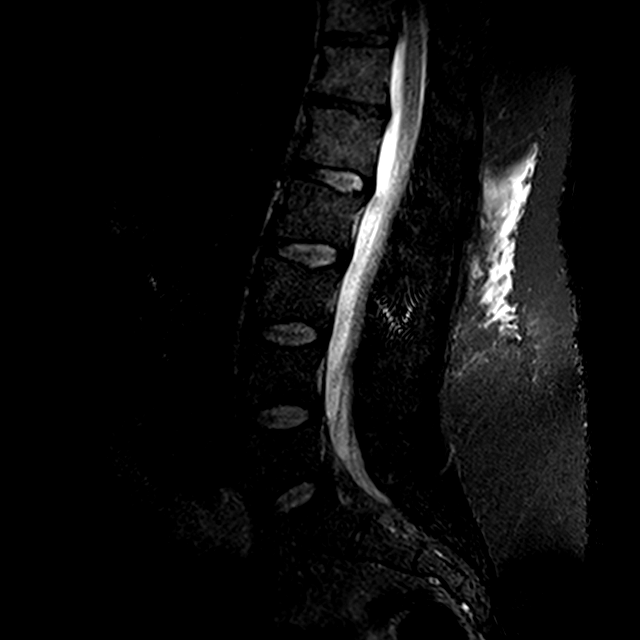
[im 18/18]
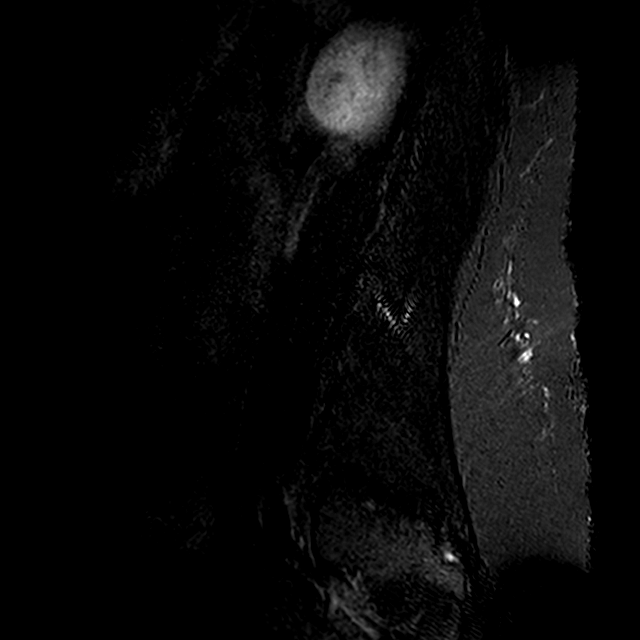

[Series 6: T2 · axial · 4.0mm · 0.33mm/px · z∈[-75,+94]mm · 3 of 48 slices shown (3 of 3)]
[im 7/48]
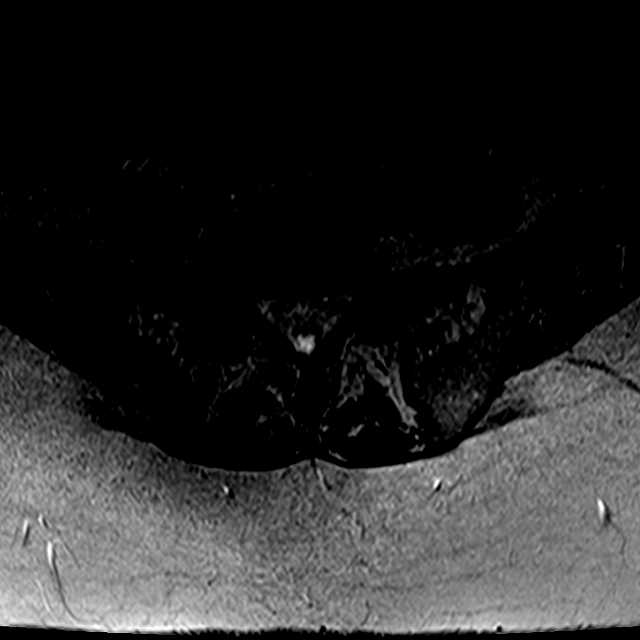
[im 26/48]
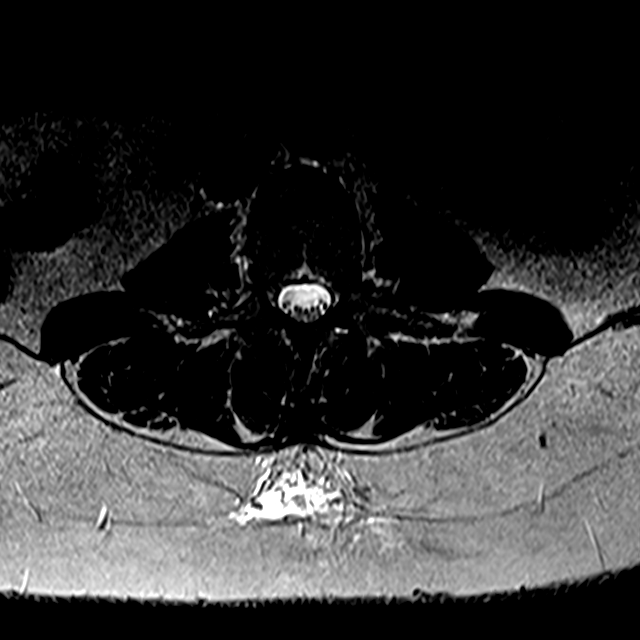
[im 41/48]
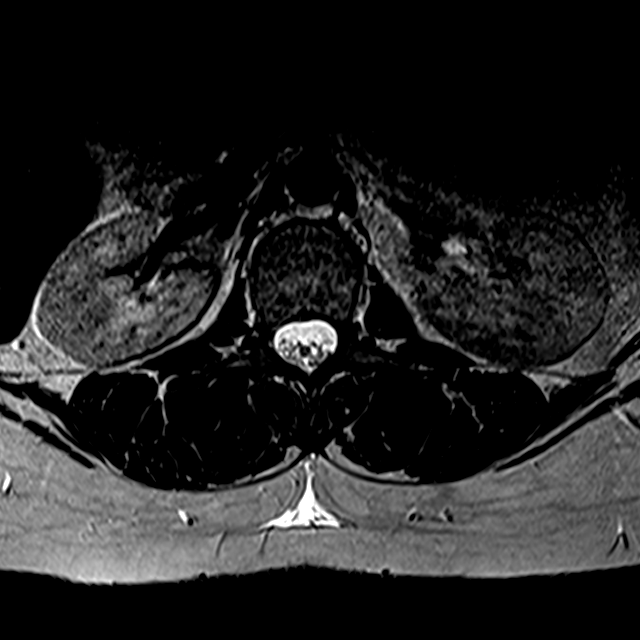

[12 of 48 positions shown; findings below may reference images not displayed]

FINDINGS: Segmentation: Standard. Lowest well-formed disc space labeled the
L5-S1 level.

Alignment: Physiologic with preservation of the normal lumbar
lordosis. No listhesis.

Vertebrae: Vertebral body height maintained without evidence for
acute or chronic fracture. Bone marrow signal intensity within
normal limits. No discrete or worrisome osseous lesions. No abnormal
marrow edema.

Conus medullaris and cauda equina: Conus extends to the L1 level.
Conus and cauda equina appear normal.

Paraspinal and other soft tissues: Paraspinous soft tissues within
normal limits. 8 mm simple appearing cystic lesion seen involving
the pancreatic tail (series 6, image 4). Visualized visceral
structures otherwise unremarkable.

Disc levels:

T11-12: Seen only on sagittal projection. Diffuse disc bulge with
disc desiccation. No significant stenosis.

T12-L1: Right eccentric disc bulge with disc desiccation.
Superimposed shallow right paracentral disc protrusion flattens the
right ventral thecal sac. Associated annular fissure. No significant
spinal stenosis. Foramina remain patent.

L1-2:  Mild annular disc bulge. No canal or foraminal stenosis.

L2-3:  \\\ unremarkable.

L3-4: Normal interspace. Minimal facet hypertrophy. No canal or
foraminal stenosis.

L4-5: Normal interspace. Mild facet hypertrophy. No canal or
foraminal stenosis.

L5-S1: Normal interspace. Mild facet hypertrophy. No canal or
foraminal stenosis.
IMPRESSION: 1. Shallow right paracentral disc protrusion at T12-L1 without
significant stenosis or neural impingement.
2. Additional mild noncompressive disc bulging at T11-12 and L1-2
without stenosis or impingement.
3. Mild bilateral facet hypertrophy at L3-4 through L5-S1.
4. 8 mm cystic lesion arising from the pancreatic tail,
indeterminate. Further assessment with dedicated pancreatic mass
protocol MRI recommended for further characterization.

## 2019-07-03 ENCOUNTER — Other Ambulatory Visit: Payer: Self-pay | Admitting: Nurse Practitioner

## 2019-07-03 DIAGNOSIS — G8929 Other chronic pain: Secondary | ICD-10-CM

## 2019-07-03 DIAGNOSIS — M545 Low back pain, unspecified: Secondary | ICD-10-CM

## 2019-07-04 ENCOUNTER — Other Ambulatory Visit: Payer: Self-pay | Admitting: Nurse Practitioner

## 2019-07-04 DIAGNOSIS — K862 Cyst of pancreas: Secondary | ICD-10-CM

## 2019-07-12 ENCOUNTER — Other Ambulatory Visit: Payer: Medicaid Other

## 2019-07-26 ENCOUNTER — Encounter (HOSPITAL_COMMUNITY): Payer: Self-pay

## 2019-07-26 ENCOUNTER — Ambulatory Visit (HOSPITAL_COMMUNITY): Admission: RE | Admit: 2019-07-26 | Payer: Medicaid Other | Source: Ambulatory Visit

## 2019-07-27 ENCOUNTER — Other Ambulatory Visit: Payer: Medicaid Other

## 2019-07-27 ENCOUNTER — Ambulatory Visit
Admission: RE | Admit: 2019-07-27 | Discharge: 2019-07-27 | Disposition: A | Discharge status: Home / Self Care | Payer: Medicaid Other | Source: Ambulatory Visit | Attending: Nurse Practitioner | Admitting: Nurse Practitioner

## 2019-07-27 ENCOUNTER — Other Ambulatory Visit: Payer: Self-pay

## 2019-07-27 DIAGNOSIS — M545 Low back pain, unspecified: Secondary | ICD-10-CM

## 2019-07-27 IMAGING — XA Imaging study
2 series · 2 of 2 positions shown · non-contrast
Comparison: none

CLINICAL DATA: Spondylosis without myelopathy. Left lower lumbar
radiculopathy without clear cause by MRI.

[Series 1: ortho standard · 1 of 1 slices shown (1 of 2)]
[im 1/1]
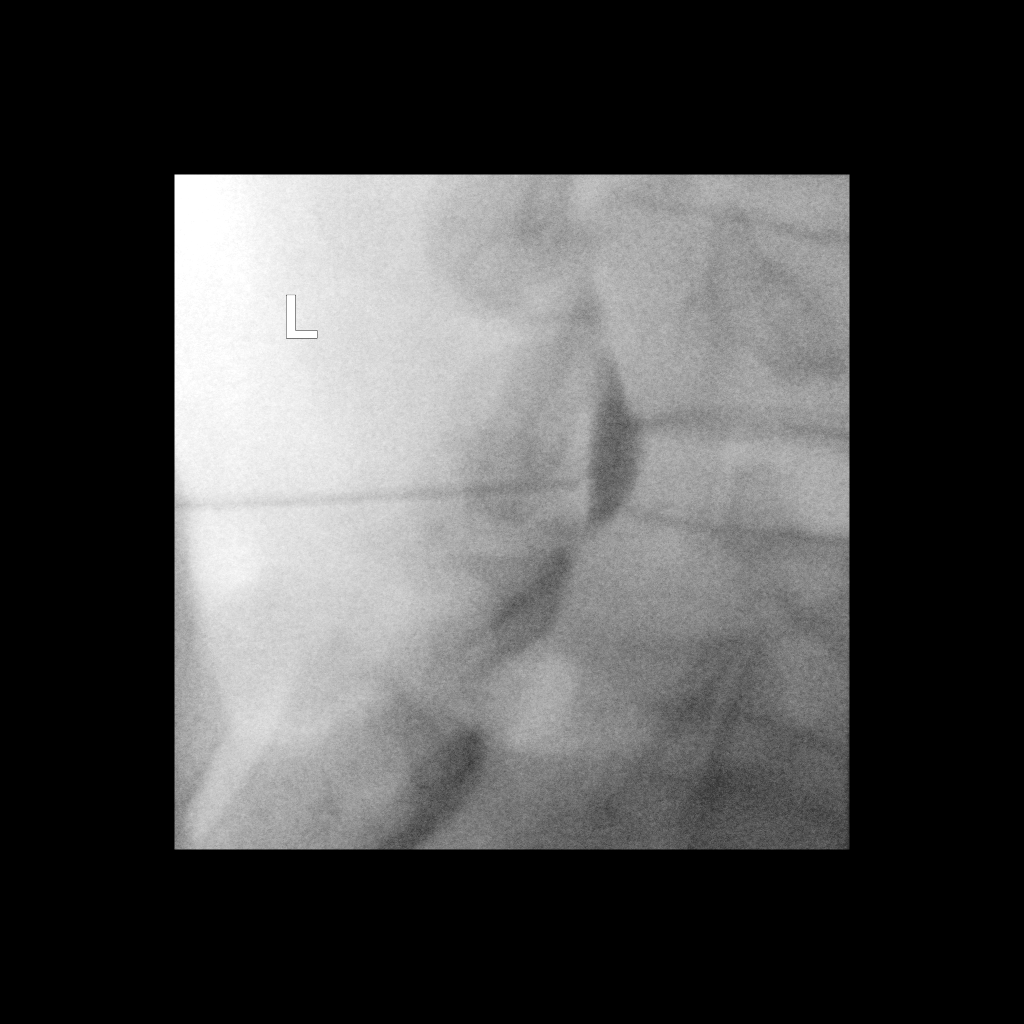

[Series 2: ortho standard · 1 of 1 slices shown (2 of 2)]
[im 1/1]
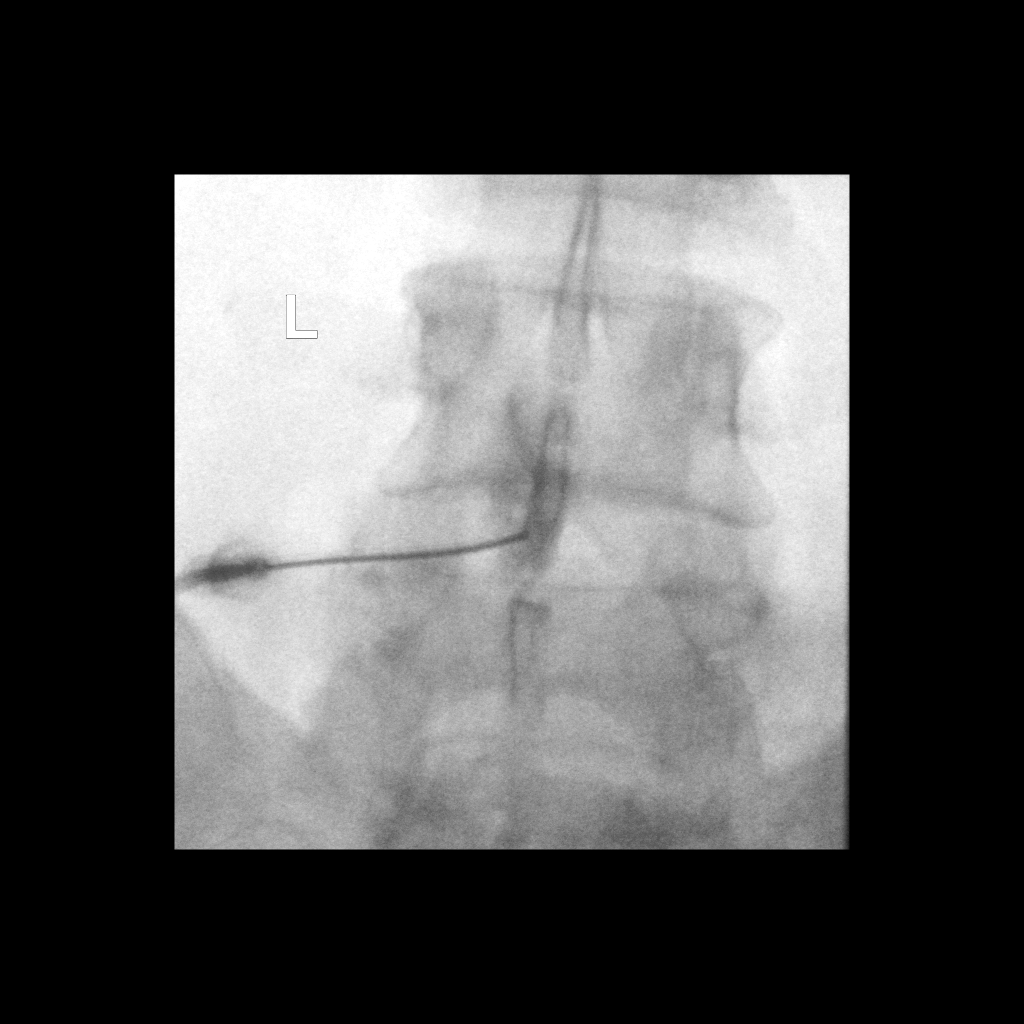

[2 of 2 positions shown; findings below may reference images not displayed]

FLUOROSCOPY TIME:  0 minutes 29 seconds. 41.65 micro gray meter
squared

PROCEDURE:
The procedure, risks, benefits, and alternatives were explained to
the patient. Questions regarding the procedure were encouraged and
answered. The patient understands and consents to the procedure.

LUMBAR EPIDURAL INJECTION:

An interlaminar approach was performed on the left at L4-5. The
overlying skin was cleansed and anesthetized. A 6 inch 20 gauge
epidural needle was advanced using loss-of-resistance technique.

DIAGNOSTIC EPIDURAL INJECTION:

Injection of Isovue-M 200 shows a good epidural pattern with spread
above and below the level of needle placement, primarily on the
left. No vascular opacification is seen.

THERAPEUTIC EPIDURAL INJECTION:

One hundred twenty mg of Depo-Medrol mixed with 2.5 cc 1% lidocaine
were instilled. The procedure was well-tolerated, and the patient
was discharged thirty minutes following the injection in good
condition.

COMPLICATIONS:
None
IMPRESSION: Technically successful epidural injection on the left at L4-5 # 1.

## 2019-07-27 MED ORDER — IOPAMIDOL (ISOVUE-M 200) INJECTION 41%
1.0000 mL | Freq: Once | INTRAMUSCULAR | Status: AC
  Administered 2019-07-27: 1 mL via EPIDURAL

## 2019-07-27 MED ORDER — METHYLPREDNISOLONE ACETATE 40 MG/ML INJ SUSP (RADIOLOG
120.0000 mg | Freq: Once | INTRAMUSCULAR | Status: AC
  Administered 2019-07-27: 120 mg via EPIDURAL

## 2019-07-27 NOTE — Discharge Instructions (Signed)

## 2019-08-24 ENCOUNTER — Ambulatory Visit (HOSPITAL_COMMUNITY)
Admission: RE | Admit: 2019-08-24 | Discharge: 2019-08-24 | Disposition: A | Discharge status: Home / Self Care | Payer: Medicaid Other | Source: Ambulatory Visit | Attending: Nurse Practitioner | Admitting: Nurse Practitioner

## 2019-08-24 ENCOUNTER — Other Ambulatory Visit: Payer: Self-pay | Admitting: Nurse Practitioner

## 2019-08-24 ENCOUNTER — Other Ambulatory Visit: Payer: Self-pay

## 2019-08-24 DIAGNOSIS — K862 Cyst of pancreas: Secondary | ICD-10-CM

## 2019-08-24 IMAGING — MR MR ABDOMEN WO/W CM
9 of 20 series · 14 of 48 positions shown · IV contrast (gadavist)
Comparison: None.

CLINICAL DATA: Cystic pancreatic lesion on recent lumbar MRI.

EXAM:
MRI ABDOMEN WITHOUT AND WITH CONTRAST
TECHNIQUE: Multiplanar multisequence MR imaging of the abdomen was performed
both before and after the administration of intravenous contrast.
CONTRAST:  10mL GADAVIST GADOBUTROL 1 MMOL/ML IV SOLN

[Series 4: T2 · coronal · 5.0mm · 1.32mm/px · 1 of 34 slices shown (1 of 3)]
[im 1/34]
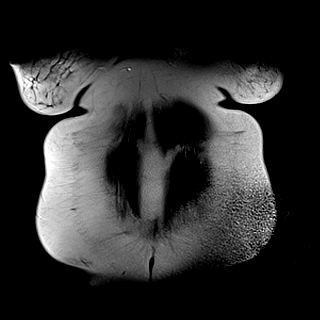

[Series 5: T2 · axial · 4.0mm · 1.29mm/px · 1 of 54 slices shown (2 of 3)]
[im 1/54]
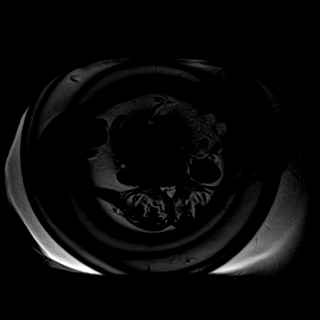

[Series 6: ax dual echo_in · axial · 4.0mm · 0.63mm/px · z∈[-125,+159]mm · 2 of 72 slices shown]
[im 1/72]
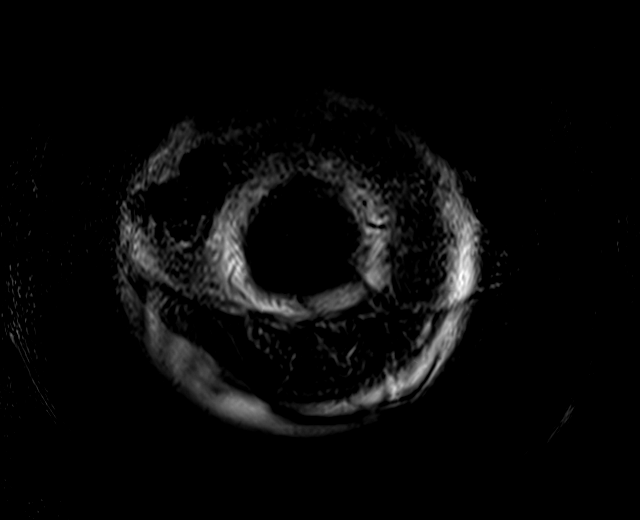
[im 72/72]
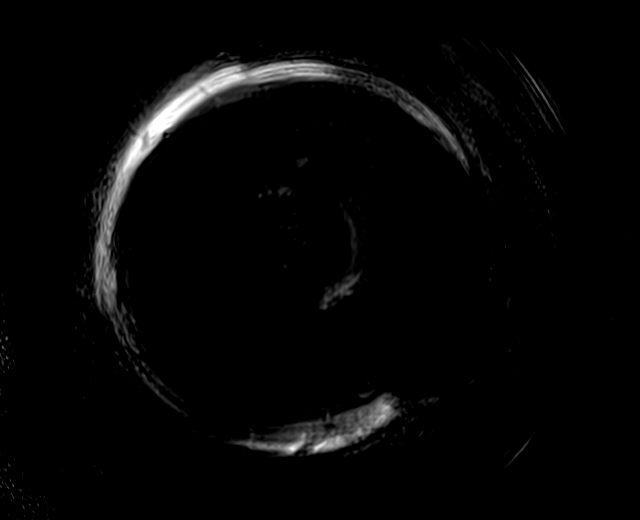

[Series 7: ax dual echo_opp · axial · 4.0mm · 0.63mm/px · z∈[-125,+159]mm · 2 of 72 slices shown]
[im 1/72]
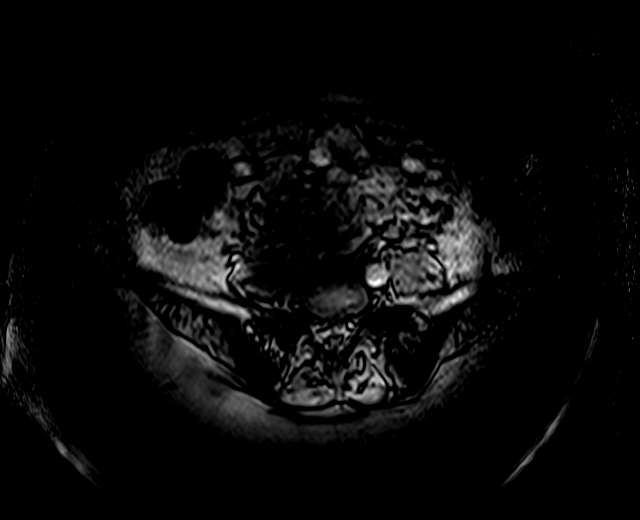
[im 72/72]
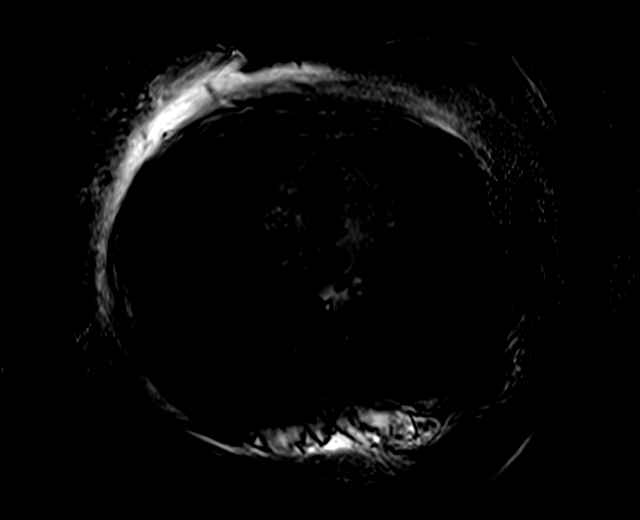

[Series 10: DWI · axial · 5.0mm · 1.15mm/px · z∈[-103,+154]mm · 2 of 88 slices shown]
[im 1/88]
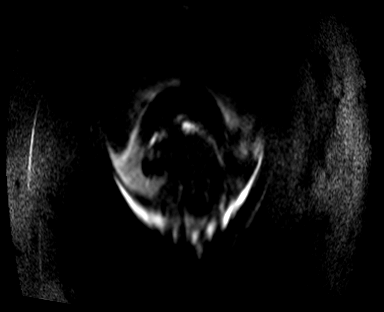
[im 88/88]
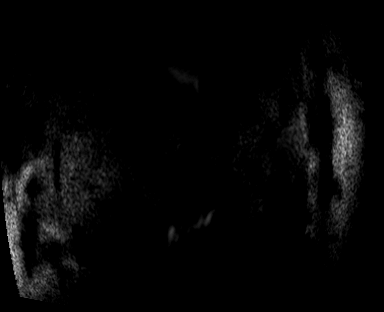

[Series 11: ax dwi_adc · axial · 5.0mm · 1.15mm/px · 1 of 44 slices shown]
[im 1/44]
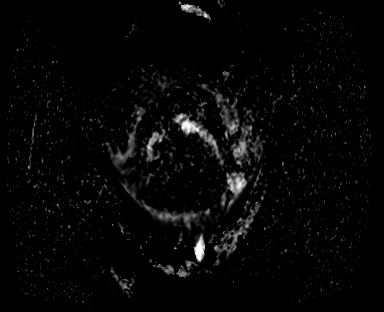

[Series 12: bSSFP · axial · 5.0mm · 0.81mm/px · z∈[-113,+162]mm · 2 of 56 slices shown]
[im 1/56]
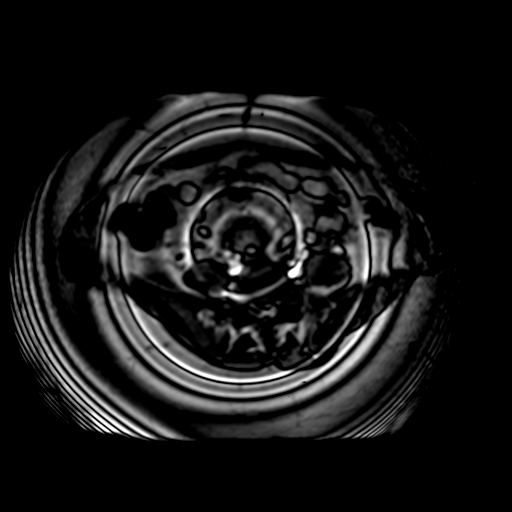
[im 56/56]
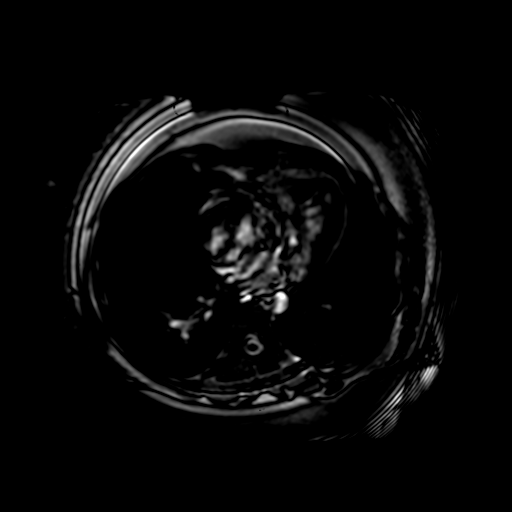

[Series 23: T2 · axial · 5.0mm · 1.61mm/px · z∈[-112,+146]mm · 2 of 44 slices shown (3 of 3)]
[im 1/44]
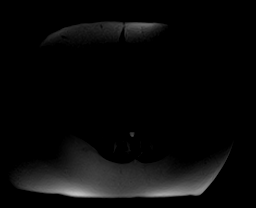
[im 44/44]
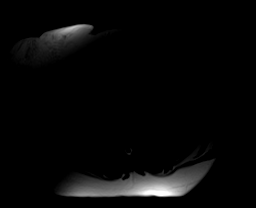

[Series 100: MRCP · axial · 0.65mm/px · 1 of 18 slices shown]
[im 1/18]
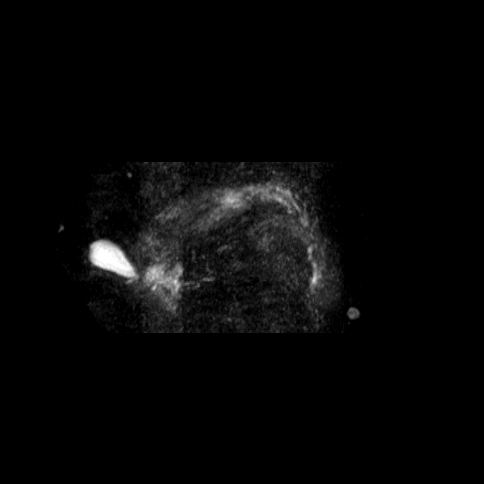

[14 of 48 positions shown; findings below may reference images not displayed]

FINDINGS: Lower chest: No acute findings.

Hepatobiliary: Tiny sub-cm T2 hyperintense lesion is seen in the
posterior right hepatic lobe which is too small to characterize, but
most likely represents a tiny benign cyst or hemangioma. No
suspicious hepatic masses identified. Gallbladder is unremarkable.
No evidence of biliary ductal dilatation.

Pancreas: A 9 mm simple appearing cyst is seen in the pancreatic
tail (image 19/23). No other pancreatic lesions are identified. No
evidence of pancreatic ductal dilatation or peripancreatic
inflammatory changes.

Spleen:  Within normal limits in size and appearance.

Adrenals/Urinary Tract: No masses identified. No evidence of
hydronephrosis.

Stomach/Bowel: Visualized portion unremarkable.

Vascular/Lymphatic: No pathologically enlarged lymph nodes
identified. No abdominal aortic aneurysm.

Other:  None.

Musculoskeletal:  No suspicious bone lesions identified.
IMPRESSION: 9 mm simple appearing cyst in the pancreatic tail, likely
representing a an indolent cystic neoplasm such as a side-branch
IPMN. Recommend continued follow-up by MRI in 1 year. This
recommendation follows ACR consensus guidelines: Management of
Incidental Pancreatic Cysts: A White Paper of the ACR Incidental
Findings Committee. [HOSPITAL] [PO];[DATE].

## 2019-08-24 MED ORDER — GADOBUTROL 1 MMOL/ML IV SOLN
10.0000 mL | Freq: Once | INTRAVENOUS | Status: AC | PRN
  Administered 2019-08-24: 10 mL via INTRAVENOUS

## 2019-09-11 ENCOUNTER — Other Ambulatory Visit: Payer: Self-pay | Admitting: Nurse Practitioner

## 2019-09-11 DIAGNOSIS — G8929 Other chronic pain: Secondary | ICD-10-CM

## 2019-09-20 ENCOUNTER — Inpatient Hospital Stay: Admission: RE | Admit: 2019-09-20 | Payer: Medicaid Other | Source: Ambulatory Visit

## 2019-10-17 ENCOUNTER — Ambulatory Visit
Admission: RE | Admit: 2019-10-17 | Discharge: 2019-10-17 | Disposition: A | Discharge status: Home / Self Care | Payer: Medicaid Other | Source: Ambulatory Visit | Attending: Nurse Practitioner | Admitting: Nurse Practitioner

## 2019-10-17 ENCOUNTER — Other Ambulatory Visit: Payer: Self-pay

## 2019-10-17 DIAGNOSIS — M545 Low back pain, unspecified: Secondary | ICD-10-CM

## 2019-10-17 IMAGING — XA Imaging study
2 series · 2 of 2 positions shown · non-contrast
Comparison: none

CLINICAL DATA: Lumbosacral spondylosis without myelopathy. Left low
back and left leg pain. 60% improvement for 2 months following the
epidural injection in [DATE].

[Series 2: ortho adipose · 1 of 1 slices shown (1 of 2)]
[im 1/1]
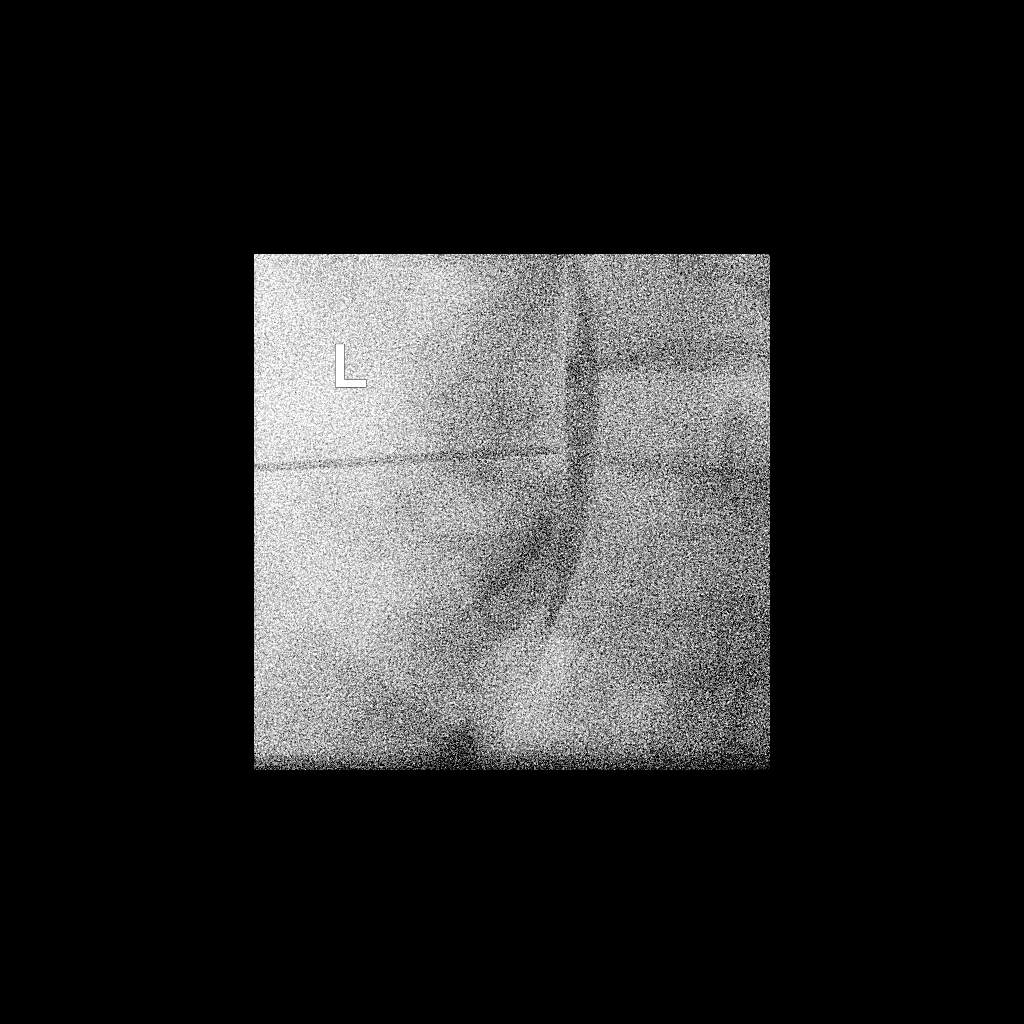

[Series 3: ortho adipose · 1 of 1 slices shown (2 of 2)]
[im 1/1]
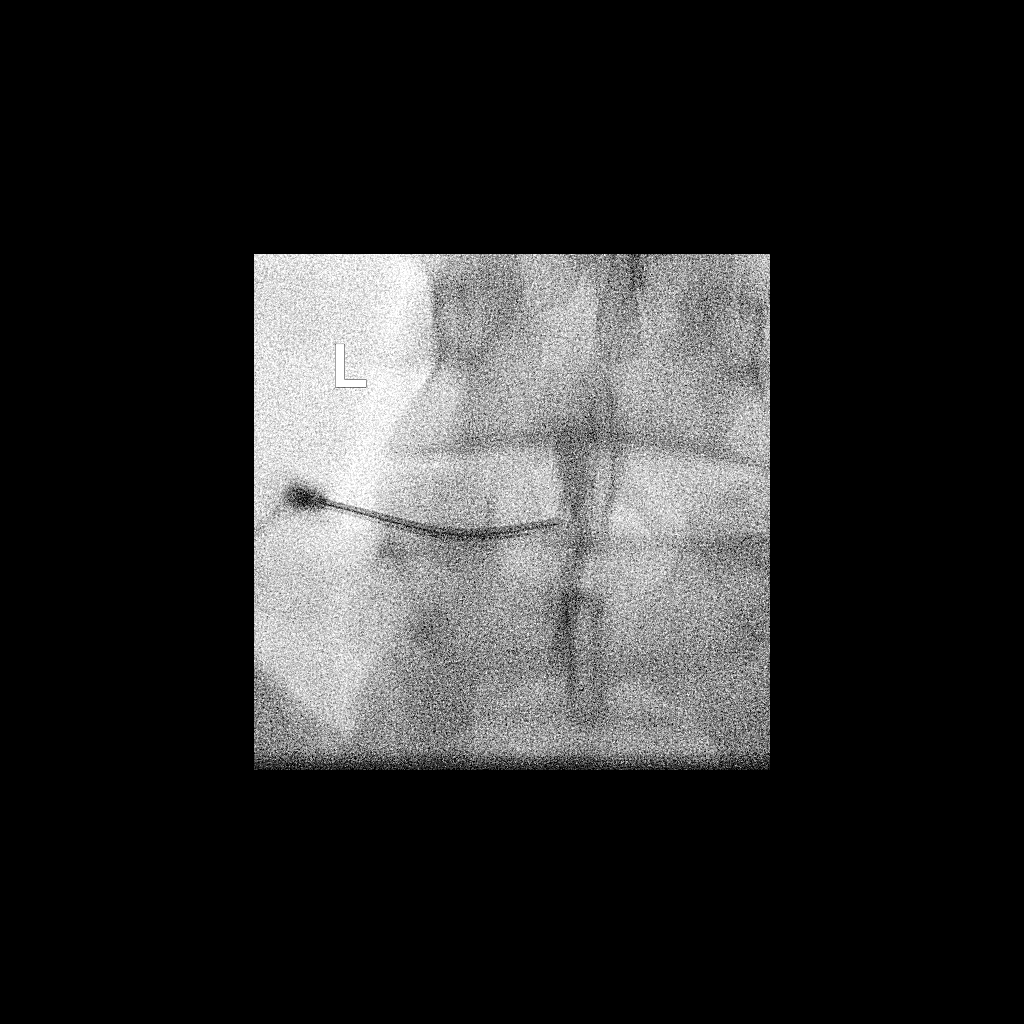

[2 of 2 positions shown; findings below may reference images not displayed]

FLUOROSCOPY TIME:  Fluoroscopy Time: 14 seconds

Radiation Exposure Index: 16.87 microGray*m^2

PROCEDURE:
The procedure, risks, benefits, and alternatives were explained to
the patient. Questions regarding the procedure were encouraged and
answered. The patient understands and consents to the procedure.

LUMBAR EPIDURAL INJECTION:

An interlaminar approach was performed on the left at L4-5. The
overlying skin was cleansed and anesthetized. A 6 inch 20 gauge
epidural needle was advanced using loss-of-resistance technique.

DIAGNOSTIC EPIDURAL INJECTION:

Injection of Isovue-M 200 shows a good epidural pattern with spread
above and below the level of needle placement, primarily on the
left. No vascular opacification is seen.

THERAPEUTIC EPIDURAL INJECTION:

120 mg of Depo-Medrol mixed with 3 mL of 1% lidocaine were
instilled. The procedure was well-tolerated, and the patient was
discharged thirty minutes following the injection in good condition.

COMPLICATIONS:
None
IMPRESSION: Technically successful interlaminar epidural injection on the left
at L4-5.

## 2019-10-17 MED ORDER — IOPAMIDOL (ISOVUE-M 200) INJECTION 41%
1.0000 mL | Freq: Once | INTRAMUSCULAR | Status: AC
  Administered 2019-10-17: 1 mL via EPIDURAL

## 2019-10-17 MED ORDER — METHYLPREDNISOLONE ACETATE 40 MG/ML INJ SUSP (RADIOLOG
120.0000 mg | Freq: Once | INTRAMUSCULAR | Status: AC
  Administered 2019-10-17: 120 mg via EPIDURAL

## 2019-10-17 NOTE — Discharge Instructions (Signed)

## 2020-02-04 ENCOUNTER — Telehealth: Payer: Self-pay | Admitting: Women's Health

## 2020-02-04 ENCOUNTER — Other Ambulatory Visit: Payer: Self-pay | Admitting: Women's Health

## 2020-02-04 MED ORDER — NORETHINDRONE 0.35 MG PO TABS: 1.0000 | ORAL_TABLET | Freq: Every day | ORAL | 2 refills | Status: DC

## 2020-02-04 NOTE — Telephone Encounter (Signed)
Pt is scheduled for PAP/phys on 03/18/19 w/[facility], will need refill on her [facility] until she can be seen  Please advise & notify pt     [facility]

## 2020-03-17 ENCOUNTER — Other Ambulatory Visit (HOSPITAL_COMMUNITY)
Admission: RE | Admit: 2020-03-17 | Discharge: 2020-03-17 | Disposition: A | Discharge status: Home / Self Care | Payer: Medicaid Other | Source: Ambulatory Visit | Attending: Obstetrics & Gynecology | Admitting: Obstetrics & Gynecology

## 2020-03-17 ENCOUNTER — Encounter: Payer: Self-pay | Admitting: Women's Health

## 2020-03-17 ENCOUNTER — Ambulatory Visit (INDEPENDENT_AMBULATORY_CARE_PROVIDER_SITE_OTHER): Payer: Medicaid Other | Admitting: Women's Health

## 2020-03-17 ENCOUNTER — Other Ambulatory Visit: Payer: Self-pay

## 2020-03-17 VITALS — BP 123/86 | HR 87 | Ht 64.0 in | Wt 224.0 lb

## 2020-03-17 DIAGNOSIS — Z01419 Encounter for gynecological examination (general) (routine) without abnormal findings: Secondary | ICD-10-CM | POA: Diagnosis present

## 2020-03-17 DIAGNOSIS — Z1231 Encounter for screening mammogram for malignant neoplasm of breast: Secondary | ICD-10-CM

## 2020-03-17 MED ORDER — NORETHINDRONE 0.35 MG PO TABS: 1.0000 | ORAL_TABLET | Freq: Every day | ORAL | 3 refills | Status: DC

## 2020-03-17 NOTE — Progress Notes (Signed)
WELL-WOMAN EXAMINATION Patient name: Michelle Payne MRN [id number]  Date of birth: March 17, 1975 Chief Complaint:   Gynecologic Exam  History of Present Illness:   Michelle Payne is a 45 y.o. [phone] Caucasian female being seen today for a routine well-woman exam.  Current complaints: odor after sex, feels like partner is cheating  Depression screen [facility] 2/9 03/17/2020 03/07/2019 02/20/2018 06/22/2016  Decreased Interest 0 0 0 0  Down, Depressed, Hopeless 0 0 0 0  PHQ - 2 Score 0 0 0 0  Altered sleeping - - - 1  Tired, decreased energy - - - 1  Change in appetite - - - 0  Feeling bad or failure about yourself  - - - 0  Trouble concentrating - - - 0  Moving slowly or fidgety/restless - - - 0  Suicidal thoughts - - - 0  PHQ-9 Score - - - 2     PCP: Sudie Bailey      does not desire labs Patient's last menstrual period was 02/29/2020. The current method of family planning is oral progesterone-only contraceptive.  Last pap 03/07/19. Results were: NILM w/ HRHPV positive. H/O abnormal pap: yes Last mammogram: never. Results were: N/A. Family h/o breast cancer: no Last colonoscopy: never. Results were: N/A. Family h/o colorectal cancer: no Review of Systems:   Pertinent items are noted in HPI Denies any headaches, blurred vision, fatigue, shortness of breath, chest pain, abdominal pain, abnormal vaginal discharge/itching/odor/irritation, problems with periods, bowel movements, urination, or intercourse unless otherwise stated above. Pertinent History Reviewed:  Reviewed past medical,surgical, social and family history.  Reviewed problem list, medications and allergies. Physical Assessment:   Vitals:   03/17/20 1519  BP: 123/86  Pulse: 87  Weight: 224 lb (101.6 kg)  Height: 5\' 4"  (1.626 m)  Body mass index is 38.45 kg/m.        Physical Examination:   General appearance - well appearing, and in no distress  Mental status - alert, oriented to person, place, and time  Psych:  She has a  normal mood and affect  Skin - warm and dry, normal color, no suspicious lesions noted  Chest - effort normal, all lung fields clear to auscultation bilaterally  Heart - normal rate and regular rhythm  Neck:  midline trachea, no thyromegaly or nodules  Breasts - breasts appear normal, no suspicious masses, no skin or nipple changes or  axillary nodes  Abdomen - soft, nontender, nondistended, no masses or organomegaly  Pelvic - VULVA: normal appearing vulva with no masses, tenderness or lesions  VAGINA: normal appearing vagina with normal color and discharge, no lesions  CERVIX: normal appearing cervix without discharge or lesions, no CMT  Thin prep pap is done w/ HR HPV cotesting  UTERUS: uterus is felt to be normal size, shape, consistency and nontender   ADNEXA: No adnexal masses or tenderness noted.  Extremities:  No swelling or varicosities noted  Chaperone:    No results found for this or any previous visit (from the past 24 hour(s)).  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 MM 3D SCREEN BREAST BILATERAL    Meds:  Meds ordered this encounter  Medications  . norethindrone (  MICRONOR) 0.35 MG tablet    Sig: Take 1 tablet (0.35 mg total) by mouth daily.    Dispense:  90 tablet    Refill:  3    Order Specific Question:   Supervising Provider    Answer:   Lazaro Arms [[id number]]    Follow-up: Return in about 1 year (around 03/17/2021) for Physical.  Cheral Marker CNM, WHNP-BC 03/17/2020 4:08 PM

## 2020-03-17 NOTE — Patient Instructions (Signed)
Call [facility] at [phone] to schedule your mammogram for as soon as possible

## 2020-03-20 LAB — CYTOLOGY - PAP
Chlamydia: NEGATIVE
Comment: NEGATIVE
Comment: NEGATIVE
Comment: NORMAL
Diagnosis: NEGATIVE
High risk HPV: NEGATIVE
Neisseria Gonorrhea: NEGATIVE

## 2020-03-25 ENCOUNTER — Telehealth: Payer: Self-pay

## 2020-03-25 ENCOUNTER — Other Ambulatory Visit: Payer: Self-pay | Admitting: Women's Health

## 2020-03-25 MED ORDER — METRONIDAZOLE 500 MG PO TABS: 500.0000 mg | ORAL_TABLET | Freq: Two times a day (BID) | ORAL | 0 refills | Status: DC

## 2020-03-25 NOTE — Telephone Encounter (Signed)
Called pt to inform her of pap results. She would like a prescription for BV.

## 2020-05-23 DIAGNOSIS — G5601 Carpal tunnel syndrome, right upper limb: Secondary | ICD-10-CM | POA: Diagnosis not present

## 2020-05-23 DIAGNOSIS — R030 Elevated blood-pressure reading, without diagnosis of hypertension: Secondary | ICD-10-CM | POA: Diagnosis not present

## 2020-05-23 DIAGNOSIS — E6609 Other obesity due to excess calories: Secondary | ICD-10-CM | POA: Diagnosis not present

## 2020-05-23 DIAGNOSIS — Z79899 Other long term (current) drug therapy: Secondary | ICD-10-CM | POA: Diagnosis not present

## 2020-07-23 DIAGNOSIS — F419 Anxiety disorder, unspecified: Secondary | ICD-10-CM | POA: Diagnosis not present

## 2020-07-23 DIAGNOSIS — E6609 Other obesity due to excess calories: Secondary | ICD-10-CM | POA: Diagnosis not present

## 2020-10-01 DIAGNOSIS — F17210 Nicotine dependence, cigarettes, uncomplicated: Secondary | ICD-10-CM | POA: Diagnosis not present

## 2020-10-01 DIAGNOSIS — Z79899 Other long term (current) drug therapy: Secondary | ICD-10-CM | POA: Diagnosis not present

## 2020-10-01 DIAGNOSIS — E6609 Other obesity due to excess calories: Secondary | ICD-10-CM | POA: Diagnosis not present

## 2020-12-23 DIAGNOSIS — R030 Elevated blood-pressure reading, without diagnosis of hypertension: Secondary | ICD-10-CM | POA: Diagnosis not present

## 2020-12-23 DIAGNOSIS — Z79899 Other long term (current) drug therapy: Secondary | ICD-10-CM | POA: Diagnosis not present

## 2020-12-23 DIAGNOSIS — F419 Anxiety disorder, unspecified: Secondary | ICD-10-CM | POA: Diagnosis not present

## 2020-12-23 DIAGNOSIS — E6609 Other obesity due to excess calories: Secondary | ICD-10-CM | POA: Diagnosis not present

## 2021-03-17 DIAGNOSIS — F419 Anxiety disorder, unspecified: Secondary | ICD-10-CM | POA: Diagnosis not present

## 2021-03-17 DIAGNOSIS — R030 Elevated blood-pressure reading, without diagnosis of hypertension: Secondary | ICD-10-CM | POA: Diagnosis not present

## 2021-03-17 DIAGNOSIS — E6609 Other obesity due to excess calories: Secondary | ICD-10-CM | POA: Diagnosis not present

## 2021-03-25 ENCOUNTER — Other Ambulatory Visit: Payer: Self-pay

## 2021-03-25 ENCOUNTER — Encounter: Payer: Self-pay | Admitting: Women's Health

## 2021-03-25 ENCOUNTER — Ambulatory Visit (INDEPENDENT_AMBULATORY_CARE_PROVIDER_SITE_OTHER): Payer: Medicaid Other | Admitting: Women's Health

## 2021-03-25 VITALS — BP 141/91 | HR 91 | Ht 64.0 in | Wt 236.0 lb

## 2021-03-25 DIAGNOSIS — Z01419 Encounter for gynecological examination (general) (routine) without abnormal findings: Secondary | ICD-10-CM

## 2021-03-25 DIAGNOSIS — Z1231 Encounter for screening mammogram for malignant neoplasm of breast: Secondary | ICD-10-CM

## 2021-03-25 MED ORDER — NORETHINDRONE 0.35 MG PO TABS: 1.0000 | ORAL_TABLET | Freq: Every day | ORAL | 3 refills | Status: DC

## 2021-03-25 NOTE — Progress Notes (Signed)
WELL-WOMAN EXAMINATION Patient name: Michelle Payne MRN [id number]  Date of birth: 01/15/1976 Chief Complaint:   Annual Exam and Gynecologic Exam (Pap Rosanne Gutting- last pap 03-17-20 normal)  History of Present Illness:   Michelle Payne is a 46 y.o. [phone] Caucasian female being seen today for a routine well-woman exam.  Current complaints: none  PCP: Sudie Bailey      does not desire labs Patient's last menstrual period was 03/21/2021. The current method of family planning is oral progesterone-only contraceptive.  Last pap 03/17/20. Results were: NILM w/ HRHPV negative. H/O abnormal pap: yes Last mammogram: never. Results were: N/A. Family h/o breast cancer: no Last colonoscopy: never. Results were: N/A. Family h/o colorectal cancer: no  Depression screen [facility] 2/9 03/25/2021 03/17/2020 03/07/2019 02/20/2018 06/22/2016  Decreased Interest 2 0 0 0 0  Down, Depressed, Hopeless 1 0 0 0 0  PHQ - 2 Score 3 0 0 0 0  Altered sleeping 0 - - - 1  Tired, decreased energy 1 - - - 1  Change in appetite 1 - - - 0  Feeling bad or failure about yourself  1 - - - 0  Trouble concentrating 0 - - - 0  Moving slowly or fidgety/restless 0 - - - 0  Suicidal thoughts 0 - - - 0  PHQ-9 Score 6 - - - 2     GAD 7 : Generalized Anxiety Score 03/25/2021 03/17/2020  Nervous, Anxious, on Edge 1 0  Control/stop worrying 0 0  Worry too much - different things 0 0  Trouble relaxing 1 1  Restless 1 0  Easily annoyed or irritable 1 1  Afraid - awful might happen 0 0  Total GAD 7 Score 4 2     Review of Systems:   Pertinent items are noted in HPI Denies any headaches, blurred vision, fatigue, shortness of breath, chest pain, abdominal pain, abnormal vaginal discharge/itching/odor/irritation, problems with periods, bowel movements, urination, or intercourse unless otherwise stated above. Pertinent History Reviewed:  Reviewed past medical,surgical, social and family history.  Reviewed problem list, medications and  allergies. Physical Assessment:   Vitals:   03/25/21 1453  BP: (!) 141/91  Pulse: 91  Weight: 236 lb (107 kg)  Height: 5\' 4"  (1.626 m)  Body mass index is 40.51 kg/m.        Physical Examination:   General appearance - well appearing, and in no distress  Mental status - alert, oriented to person, place, and time  Psych:  She has a normal mood and affect  Skin - warm and dry, normal color, no suspicious lesions noted  Chest - effort normal, all lung fields clear to auscultation bilaterally  Heart - normal rate and regular rhythm  Neck:  midline trachea, no thyromegaly or nodules  Breasts - breasts appear normal, no suspicious masses, no skin or nipple changes or  axillary nodes  Abdomen - soft, nontender, nondistended, no masses or organomegaly  Pelvic - VULVA: normal appearing vulva with no masses, tenderness or lesions  VAGINA: normal appearing vagina with normal color and discharge, no lesions  CERVIX: normal appearing cervix without discharge or lesions, no CMT  Thin prep pap is not done   UTERUS: uterus is felt to be normal size, shape, consistency and nontender   ADNEXA: No adnexal masses or tenderness noted.  Extremities:  No swelling or varicosities noted  Chaperone: Latisha Cresenzo    No results found for this or any previous visit (from the past 24 hour(s)).  Assessment &  Plan:  1) Well-Woman Exam  2) Contraception management> refilled POPs x 27yr  3) Past due for screening mammogram> order placed and note routed to [facility] to schedule  Labs/procedures today: exam  Mammogram: schedule screening mammo as soon as possible, or sooner if problems Colonoscopy: @ 45yo, or sooner if problems  Orders Placed This Encounter  Procedures   MS DIGITAL SCREENING TOMO BILATERAL    Meds:  Meds ordered this encounter  Medications   norethindrone (MICRONOR) 0.35 MG tablet    Sig: Take 1 tablet (0.35 mg total) by mouth daily.    Dispense:  90 tablet    Refill:  3    Order  Specific Question:   Supervising Provider    Answer:   Lazaro Arms [[id number]]    Follow-up: Return in about 1 year (around 03/25/2022) for Physical.  Cheral Marker CNM, [facility] 03/25/2021 3:53 PM

## 2021-03-25 NOTE — Addendum Note (Signed)
Addended by: Moss Mc on: 03/25/2021 03:58 PM   Modules accepted: Orders

## 2021-03-26 ENCOUNTER — Other Ambulatory Visit: Payer: Self-pay | Admitting: Women's Health

## 2021-06-10 DIAGNOSIS — J309 Allergic rhinitis, unspecified: Secondary | ICD-10-CM | POA: Diagnosis not present

## 2021-06-10 DIAGNOSIS — R030 Elevated blood-pressure reading, without diagnosis of hypertension: Secondary | ICD-10-CM | POA: Diagnosis not present

## 2021-06-10 DIAGNOSIS — Z6379 Other stressful life events affecting family and household: Secondary | ICD-10-CM | POA: Diagnosis not present

## 2021-08-31 DIAGNOSIS — R030 Elevated blood-pressure reading, without diagnosis of hypertension: Secondary | ICD-10-CM | POA: Diagnosis not present

## 2021-08-31 DIAGNOSIS — Z79899 Other long term (current) drug therapy: Secondary | ICD-10-CM | POA: Diagnosis not present

## 2021-08-31 DIAGNOSIS — E6609 Other obesity due to excess calories: Secondary | ICD-10-CM | POA: Diagnosis not present

## 2021-08-31 DIAGNOSIS — F419 Anxiety disorder, unspecified: Secondary | ICD-10-CM | POA: Diagnosis not present

## 2021-09-02 DIAGNOSIS — F419 Anxiety disorder, unspecified: Secondary | ICD-10-CM | POA: Diagnosis not present

## 2021-09-02 DIAGNOSIS — R030 Elevated blood-pressure reading, without diagnosis of hypertension: Secondary | ICD-10-CM | POA: Diagnosis not present

## 2021-12-07 ENCOUNTER — Other Ambulatory Visit: Payer: Self-pay | Admitting: Women's Health

## 2021-12-09 DIAGNOSIS — M25519 Pain in unspecified shoulder: Secondary | ICD-10-CM | POA: Diagnosis not present

## 2021-12-09 DIAGNOSIS — F419 Anxiety disorder, unspecified: Secondary | ICD-10-CM | POA: Diagnosis not present

## 2021-12-09 DIAGNOSIS — K862 Cyst of pancreas: Secondary | ICD-10-CM | POA: Diagnosis not present

## 2021-12-09 DIAGNOSIS — R1010 Upper abdominal pain, unspecified: Secondary | ICD-10-CM | POA: Diagnosis not present

## 2021-12-11 ENCOUNTER — Other Ambulatory Visit (HOSPITAL_COMMUNITY): Payer: Self-pay | Admitting: Nurse Practitioner

## 2021-12-11 DIAGNOSIS — Z1231 Encounter for screening mammogram for malignant neoplasm of breast: Secondary | ICD-10-CM

## 2021-12-17 DIAGNOSIS — Z79899 Other long term (current) drug therapy: Secondary | ICD-10-CM | POA: Diagnosis not present

## 2021-12-17 DIAGNOSIS — F419 Anxiety disorder, unspecified: Secondary | ICD-10-CM | POA: Diagnosis not present

## 2021-12-17 DIAGNOSIS — E6609 Other obesity due to excess calories: Secondary | ICD-10-CM | POA: Diagnosis not present

## 2021-12-17 DIAGNOSIS — R030 Elevated blood-pressure reading, without diagnosis of hypertension: Secondary | ICD-10-CM | POA: Diagnosis not present

## 2021-12-18 ENCOUNTER — Other Ambulatory Visit (HOSPITAL_COMMUNITY): Payer: Self-pay | Admitting: Nurse Practitioner

## 2021-12-18 DIAGNOSIS — K862 Cyst of pancreas: Secondary | ICD-10-CM

## 2021-12-23 ENCOUNTER — Ambulatory Visit (HOSPITAL_COMMUNITY): Payer: Medicaid Other

## 2022-01-07 ENCOUNTER — Ambulatory Visit (HOSPITAL_COMMUNITY)
Admission: RE | Admit: 2022-01-07 | Discharge: 2022-01-07 | Disposition: A | Discharge status: Home / Self Care | Payer: Medicaid Other | Source: Ambulatory Visit | Attending: Nurse Practitioner | Admitting: Nurse Practitioner

## 2022-01-07 ENCOUNTER — Other Ambulatory Visit (HOSPITAL_COMMUNITY): Payer: Self-pay | Admitting: Nurse Practitioner

## 2022-01-07 DIAGNOSIS — Z1231 Encounter for screening mammogram for malignant neoplasm of breast: Secondary | ICD-10-CM

## 2022-01-07 DIAGNOSIS — R935 Abnormal findings on diagnostic imaging of other abdominal regions, including retroperitoneum: Secondary | ICD-10-CM | POA: Diagnosis not present

## 2022-01-07 DIAGNOSIS — K862 Cyst of pancreas: Secondary | ICD-10-CM | POA: Diagnosis not present

## 2022-01-07 MED ORDER — GADOBUTROL 1 MMOL/ML IV SOLN
10.0000 mL | Freq: Once | INTRAVENOUS | Status: AC | PRN
  Administered 2022-01-07: 10 mL via INTRAVENOUS

## 2022-01-18 ENCOUNTER — Other Ambulatory Visit (HOSPITAL_COMMUNITY): Payer: Self-pay | Admitting: Nurse Practitioner

## 2022-01-18 DIAGNOSIS — R928 Other abnormal and inconclusive findings on diagnostic imaging of breast: Secondary | ICD-10-CM

## 2022-01-19 ENCOUNTER — Ambulatory Visit (HOSPITAL_COMMUNITY)
Admission: RE | Admit: 2022-01-19 | Discharge: 2022-01-19 | Disposition: A | Discharge status: Home / Self Care | Payer: Medicaid Other | Source: Ambulatory Visit | Attending: Nurse Practitioner | Admitting: Nurse Practitioner

## 2022-01-19 DIAGNOSIS — N6001 Solitary cyst of right breast: Secondary | ICD-10-CM | POA: Diagnosis not present

## 2022-01-19 DIAGNOSIS — R928 Other abnormal and inconclusive findings on diagnostic imaging of breast: Secondary | ICD-10-CM

## 2022-03-03 DIAGNOSIS — K862 Cyst of pancreas: Secondary | ICD-10-CM | POA: Diagnosis not present

## 2022-03-03 DIAGNOSIS — Z79899 Other long term (current) drug therapy: Secondary | ICD-10-CM | POA: Diagnosis not present

## 2022-03-03 DIAGNOSIS — M159 Polyosteoarthritis, unspecified: Secondary | ICD-10-CM | POA: Diagnosis not present

## 2022-03-03 DIAGNOSIS — F17210 Nicotine dependence, cigarettes, uncomplicated: Secondary | ICD-10-CM | POA: Diagnosis not present

## 2022-03-29 ENCOUNTER — Ambulatory Visit: Payer: Medicaid Other | Admitting: Women's Health

## 2022-04-05 ENCOUNTER — Ambulatory Visit: Payer: Medicaid Other | Admitting: Women's Health

## 2022-04-26 ENCOUNTER — Other Ambulatory Visit (HOSPITAL_COMMUNITY)
Admission: RE | Admit: 2022-04-26 | Discharge: 2022-04-26 | Disposition: A | Discharge status: Home / Self Care | Payer: Medicaid Other | Source: Ambulatory Visit | Attending: Women's Health | Admitting: Women's Health

## 2022-04-26 ENCOUNTER — Ambulatory Visit (INDEPENDENT_AMBULATORY_CARE_PROVIDER_SITE_OTHER): Payer: Medicaid Other | Admitting: Women's Health

## 2022-04-26 ENCOUNTER — Encounter: Payer: Self-pay | Admitting: Women's Health

## 2022-04-26 VITALS — BP 152/103 | HR 104 | Ht 64.0 in | Wt 236.0 lb

## 2022-04-26 DIAGNOSIS — Z01419 Encounter for gynecological examination (general) (routine) without abnormal findings: Secondary | ICD-10-CM | POA: Insufficient documentation

## 2022-04-26 DIAGNOSIS — Z1212 Encounter for screening for malignant neoplasm of rectum: Secondary | ICD-10-CM

## 2022-04-26 DIAGNOSIS — Z1211 Encounter for screening for malignant neoplasm of colon: Secondary | ICD-10-CM | POA: Diagnosis not present

## 2022-04-26 NOTE — Progress Notes (Signed)
WELL-WOMAN EXAMINATION Patient name: Michelle Payne MRN [id number]  Date of birth: 03/03/1975 Chief Complaint:   Gynecologic Exam  History of Present Illness:   Michelle Payne is a 47 y.o. [phone] Caucasian female being seen today for a routine well-woman exam.  Current complaints: facial redness/irritation/acne x 38yr, regular periods, last one was shorter and lighter then brownish discharge after. Denies abnormal discharge, itching/odor/irritation.   On micronor d/t age/smoking, recently quit smoking! BPs have been up and down, was on phentermine and figured out it was from that, so stopped.   PCP: Karie Kirks      does not desire labs Patient's last menstrual period was 04/12/2022. The current method of family planning is oral progesterone-only contraceptive.  Last pap 03/17/20. Results were: NILM w/ HRHPV negative. H/O abnormal pap: yes Last mammogram: 01/19/22. Results were: normal. Family h/o breast cancer: no Last colonoscopy: never. Results were: N/A. Family h/o colorectal cancer: no     04/26/2022    3:31 PM 03/25/2021    3:03 PM 03/17/2020    3:21 PM 03/07/2019    1:37 PM 02/20/2018    3:43 PM  Depression screen PHQ 2/9  Decreased Interest 2 2 0 0 0  Down, Depressed, Hopeless 0 1 0 0 0  PHQ - 2 Score 2 3 0 0 0  Altered sleeping 0 0     Tired, decreased energy 1 1     Change in appetite 1 1     Feeling bad or failure about yourself  1 1     Trouble concentrating 1 0     Moving slowly or fidgety/restless 0 0     Suicidal thoughts 0 0     PHQ-9 Score 6 6           04/26/2022    3:31 PM 03/25/2021    3:04 PM 03/17/2020    3:21 PM  GAD 7 : Generalized Anxiety Score  Nervous, Anxious, on Edge 1 1 0  Control/stop worrying 0 0 0  Worry too much - different things 0 0 0  Trouble relaxing 1 1 1   Restless 1 1 0  Easily annoyed or irritable 1 1 1   Afraid - awful might happen 0 0 0  Total GAD 7 Score 4 4 2      Review of Systems:   Pertinent items are noted in HPI Denies any  headaches, blurred vision, fatigue, shortness of breath, chest pain, abdominal pain, abnormal vaginal discharge/itching/odor/irritation, problems with periods, bowel movements, urination, or intercourse unless otherwise stated above. Pertinent History Reviewed:  Reviewed past medical,surgical, social and family history.  Reviewed problem list, medications and allergies. Physical Assessment:   Vitals:   04/26/22 1515 04/26/22 1602  BP: (!) 140/100 (!) 152/103  Pulse: (!) 107 (!) 104  Weight: 236 lb (107 kg)   Height: 5\' 4"  (1.626 m)   Body mass index is 40.51 kg/m.        Physical Examination:   General appearance - well appearing, and in no distress  Mental status - alert, oriented to person, place, and time  Psych:  She has a normal mood and affect  Skin - warm and dry, normal color, no suspicious lesions noted, facial erythema/acne  Chest - effort normal, all lung fields clear to auscultation bilaterally  Heart - normal rate and regular rhythm  Neck:  midline trachea, no thyromegaly or nodules  Breasts - breasts appear normal, no suspicious masses, no skin or nipple changes or  axillary nodes  Abdomen -  soft, nontender, nondistended, no masses or organomegaly  Pelvic - VULVA: normal appearing vulva with no masses, tenderness or lesions  VAGINA: normal appearing vagina with normal color and discharge, no lesions  CERVIX: normal appearing cervix without discharge or lesions, no CMT  Thin prep pap is done w/ HR HPV cotesting  UTERUS: uterus is felt to be normal size, shape, consistency and nontender   ADNEXA: No adnexal masses or tenderness noted  Extremities:  No swelling or varicosities noted  Chaperone: Celene Squibb    No results found for this or any previous visit (from the past 24 hour(s)).  Assessment & Plan:  1) Well-Woman Exam  2) Facial acne> discussed switching to COC now that she has quit smoking, but bp's have been up & down. Recommended making appt w/  dermatology  3) Last period shorter/lighter then brown d/c> to start tracking periods, if any further issues let us know  4) Elevated bp> advised pt to let Dr. Karie Kirks know what bp's were here today.  Labs/procedures today: pap  Mammogram:  Dec , or sooner if problems Colonoscopy: schedule screening colonoscopy as soon as possible-referral ordered  Orders Placed This Encounter  Procedures   Ambulatory referral to Gastroenterology    Meds: No orders of the defined types were placed in this encounter.   Follow-up: Return in about 1 year (around 04/26/2023) for Physical.  [place], [facility] 04/26/2022 4:03 PM

## 2022-04-27 ENCOUNTER — Encounter: Payer: Self-pay | Admitting: *Deleted

## 2022-04-30 LAB — CYTOLOGY - PAP
Comment: NEGATIVE
Diagnosis: NEGATIVE
High risk HPV: NEGATIVE

## 2022-06-11 DIAGNOSIS — R7982 Elevated C-reactive protein (CRP): Secondary | ICD-10-CM | POA: Diagnosis not present

## 2022-06-11 DIAGNOSIS — F17211 Nicotine dependence, cigarettes, in remission: Secondary | ICD-10-CM | POA: Diagnosis not present

## 2022-06-11 DIAGNOSIS — F4321 Adjustment disorder with depressed mood: Secondary | ICD-10-CM | POA: Diagnosis not present

## 2022-06-11 DIAGNOSIS — F419 Anxiety disorder, unspecified: Secondary | ICD-10-CM | POA: Diagnosis not present

## 2022-06-25 DIAGNOSIS — E6609 Other obesity due to excess calories: Secondary | ICD-10-CM | POA: Diagnosis not present

## 2022-06-25 DIAGNOSIS — Z79899 Other long term (current) drug therapy: Secondary | ICD-10-CM | POA: Diagnosis not present

## 2022-06-25 DIAGNOSIS — R1010 Upper abdominal pain, unspecified: Secondary | ICD-10-CM | POA: Diagnosis not present

## 2022-08-11 DIAGNOSIS — E6609 Other obesity due to excess calories: Secondary | ICD-10-CM | POA: Diagnosis not present

## 2022-08-11 DIAGNOSIS — R7303 Prediabetes: Secondary | ICD-10-CM | POA: Diagnosis not present

## 2022-08-11 DIAGNOSIS — F17211 Nicotine dependence, cigarettes, in remission: Secondary | ICD-10-CM | POA: Diagnosis not present

## 2022-08-11 DIAGNOSIS — F419 Anxiety disorder, unspecified: Secondary | ICD-10-CM | POA: Diagnosis not present

## 2022-11-16 ENCOUNTER — Ambulatory Visit: Payer: Medicaid Other | Admitting: Internal Medicine

## 2022-11-16 ENCOUNTER — Encounter: Payer: Self-pay | Admitting: Internal Medicine

## 2022-11-16 VITALS — BP 161/112 | HR 86 | Ht 64.0 in | Wt 226.8 lb

## 2022-11-16 DIAGNOSIS — F418 Other specified anxiety disorders: Secondary | ICD-10-CM

## 2022-11-16 DIAGNOSIS — E66812 Obesity, class 2: Secondary | ICD-10-CM | POA: Diagnosis not present

## 2022-11-16 DIAGNOSIS — F411 Generalized anxiety disorder: Secondary | ICD-10-CM | POA: Diagnosis not present

## 2022-11-16 DIAGNOSIS — Z1211 Encounter for screening for malignant neoplasm of colon: Secondary | ICD-10-CM

## 2022-11-16 DIAGNOSIS — G8929 Other chronic pain: Secondary | ICD-10-CM

## 2022-11-16 DIAGNOSIS — R030 Elevated blood-pressure reading, without diagnosis of hypertension: Secondary | ICD-10-CM | POA: Diagnosis not present

## 2022-11-16 DIAGNOSIS — R7303 Prediabetes: Secondary | ICD-10-CM | POA: Diagnosis not present

## 2022-11-16 DIAGNOSIS — M7918 Myalgia, other site: Secondary | ICD-10-CM

## 2022-11-16 DIAGNOSIS — F17200 Nicotine dependence, unspecified, uncomplicated: Secondary | ICD-10-CM

## 2022-11-16 DIAGNOSIS — J449 Chronic obstructive pulmonary disease, unspecified: Secondary | ICD-10-CM

## 2022-11-16 DIAGNOSIS — Z6838 Body mass index (BMI) 38.0-38.9, adult: Secondary | ICD-10-CM

## 2022-11-16 DIAGNOSIS — Z0283 Encounter for blood-alcohol and blood-drug test: Secondary | ICD-10-CM

## 2022-11-16 MED ORDER — DULOXETINE HCL 30 MG PO CPEP: 30.0000 mg | ORAL_CAPSULE | Freq: Every day | ORAL | 3 refills | Status: DC

## 2022-11-16 NOTE — Progress Notes (Signed)
New Patient Office Visit  Subjective    Patient ID: Michelle Payne, female    DOB: 1975-06-08  Age: 47 y.o. MRN: [id number]  CC:  Chief Complaint  Patient presents with   Establish Care    Previous [place] pt. Wants to discuss anti-depression medication and Bi polar medication.    HPI Michelle Payne presents to establish care.  She is a 47 year old woman who endorses a past medical history significant for GAD, chronic musculoskeletal pain, COPD, and prediabetes.  Previously followed by Dr. Sudie Bailey.  Michelle Payne endorses poorly controlled symptoms of anxiety.  Her additional concern today is weight gain.  She is interested in medication options for weight loss, noting that she was previously prescribed phentermine.  She has focused on lifestyle changes aimed at weight loss and is lost 20 pounds over the last 3 months.  Michelle Payne is self-employed.  She endorses current tobacco use, smoking 0.5 packs/day x 20 years.  She denies alcohol and illicit drug use.  Her family medical history is significant for hypertension and cirrhosis.  Acute concerns, chronic medical conditions, and outstanding preventative care items discussed today are individually addressed in A/P below.  Outpatient Encounter Medications as of 11/16/2022  Medication Sig   albuterol (PROVENTIL HFA;VENTOLIN HFA) 108 (90 Base) MCG/ACT inhaler Inhale into the lungs every 6 (six) hours as needed for wheezing or shortness of breath.   ALPRAZolam (XANAX) 1 MG tablet Take 1 mg by mouth 2 (two) times daily as needed for anxiety.   DULoxetine (CYMBALTA) 30 MG capsule Take 1 capsule (30 mg total) by mouth daily.   ibuprofen (ADVIL,MOTRIN) 600 MG tablet Take 1 tablet (600 mg total) by mouth every 6 (six) hours.   meloxicam (MOBIC) 7.5 MG tablet Take 7.5 mg by mouth daily.   methocarbamol (ROBAXIN) 500 MG tablet Take 1 tablet (500 mg total) by mouth 2 (two) times daily.   norethindrone (MICRONOR) 0.35 MG tablet TAKE 1 TABLET BY MOUTH EVERY DAY    [DISCONTINUED] metroNIDAZOLE (FLAGYL) 500 MG tablet Take 1 tablet (500 mg total) by mouth 2 (two) times daily.   [DISCONTINUED] celecoxib (CELEBREX) 200 MG capsule Take 200 mg by mouth daily. (Patient not taking: Reported on 04/26/2022)   [DISCONTINUED] losartan (COZAAR) 25 MG tablet Take 25 mg by mouth daily. (Patient not taking: Reported on 04/26/2022)   [DISCONTINUED] naproxen (NAPROSYN) 500 MG tablet Take 500 mg by mouth 2 (two) times daily with a meal. (Patient not taking: Reported on 03/25/2021)   No facility-administered encounter medications on file as of 11/16/2022.    Past Medical History:  Diagnosis Date   Anxiety    Arthritis    knees   Bipolar 2 disorder (HCC)    Carpal tunnel syndrome on left 02/2016   COPD (chronic obstructive pulmonary disease) (HCC)    denies home O2   Depression    depression, bipolar   Headache    unspecified; 2 x/week    Past Surgical History:  Procedure Laterality Date   CARPAL TUNNEL RELEASE Left 02/24/2016   Procedure: LEFT CARPAL TUNNEL RELEASE;  Surgeon: Betha Loa, MD;  Location: [facility];  Service: Orthopedics;  Laterality: Left;   TOOTH EXTRACTION     WISDOM TOOTH EXTRACTION      Family History  Problem Relation Age of Onset   Diabetes Paternal Grandmother    Heart disease Maternal Grandmother    Cancer Maternal Grandfather    Hypertension Mother    ADD / ADHD Son  ADD / ADHD Son     Social History   Socioeconomic History   Marital status: Divorced    Spouse name: Not on file   Number of children: 4   Years of education: Not on file   Highest education level: Not on file  Occupational History   Not on file  Tobacco Use   Smoking status: Every Day    Current packs/day: 0.00    Average packs/day: 0.3 packs/day for 15.0 years (3.8 ttl pk-yrs)    Types: Cigarettes    Start date: 07/29/2001    Last attempt to quit: 07/29/2016    Years since quitting: 6.3   Smokeless tobacco: Never  Vaping Use   Vaping  status: Never Used  Substance and Sexual Activity   Alcohol use: No   Drug use: No    Types: Cocaine, Marijuana    Comment: last used 06/09/16   Sexual activity: Not Currently    Birth control/protection: None, Pill  Other Topics Concern   Not on file  Social History Narrative   Not on file   Social Determinants of Health   Financial Resource Strain: Medium Risk (04/26/2022)   Overall Financial Resource Strain (CARDIA)    Difficulty of Paying Living Expenses: Somewhat hard  Food Insecurity: No Food Insecurity (04/26/2022)   Hunger Vital Sign    Worried About Running Out of Food in the Last Year: Never true    Ran Out of Food in the Last Year: Never true  Transportation Needs: No Transportation Needs (04/26/2022)   PRAPARE - Administrator, Civil Service (Medical): No    Lack of Transportation (Non-Medical): No  Physical Activity: Insufficiently Active (04/26/2022)   Exercise Vital Sign    Days of Exercise per Week: 3 days    Minutes of Exercise per Session: 20 min  Stress: No Stress Concern Present (04/26/2022)   [employer] of Occupational Health - Occupational Stress Questionnaire    Feeling of Stress : Only a little  Social Connections: Moderately Integrated (04/26/2022)   Social Connection and Isolation Panel [NHANES]    Frequency of Communication with Friends and Family: Twice a week    Frequency of Social Gatherings with Friends and Family: Twice a week    Attends Religious Services: More than 4 times per year    Active Member of [employer] or Organizations: Yes    Attends Engineer, structural: More than 4 times per year    Marital Status: Divorced  Intimate Partner Violence: Not At Risk (04/26/2022)   Humiliation, Afraid, Rape, and Kick questionnaire    Fear of Current or Ex-Partner: No    Emotionally Abused: No    Physically Abused: No    Sexually Abused: No   Review of Systems  Constitutional:  Negative for chills and fever.  HENT:  Negative for  sore throat.   Respiratory:  Negative for cough and shortness of breath.   Cardiovascular:  Negative for chest pain, palpitations and leg swelling.  Gastrointestinal:  Negative for abdominal pain, blood in stool, constipation, diarrhea, nausea and vomiting.  Genitourinary:  Negative for dysuria and hematuria.  Musculoskeletal:  Negative for myalgias.  Skin:  Negative for itching and rash.  Neurological:  Negative for dizziness and headaches.  Psychiatric/Behavioral:  Negative for depression and suicidal ideas. The patient is nervous/anxious.     Objective    BP (!) 161/112 (BP Location: Left Arm, Patient Position: Sitting, Cuff Size: Large)   Pulse 86   Ht 5'  4" (1.626 m)   Wt 226 lb 12.8 oz (102.9 kg)   SpO2 97%   BMI 38.93 kg/m   Physical Exam Vitals reviewed.  Constitutional:      General: She is not in acute distress.    Appearance: Normal appearance. She is obese. She is not toxic-appearing.  HENT:     Head: Normocephalic and atraumatic.     Right Ear: External ear normal.     Left Ear: External ear normal.     Nose: Nose normal. No congestion or rhinorrhea.     Mouth/Throat:     Mouth: Mucous membranes are moist.     Pharynx: Oropharynx is clear. No oropharyngeal exudate or posterior oropharyngeal erythema.  Eyes:     General: No scleral icterus.    Extraocular Movements: Extraocular movements intact.     Conjunctiva/sclera: Conjunctivae normal.     Pupils: Pupils are equal, round, and reactive to light.  Cardiovascular:     Rate and Rhythm: Normal rate and regular rhythm.     Pulses: Normal pulses.     Heart sounds: Normal heart sounds. No murmur heard.    No friction rub. No gallop.  Pulmonary:     Effort: Pulmonary effort is normal.     Breath sounds: Normal breath sounds. No wheezing, rhonchi or rales.  Abdominal:     General: Abdomen is flat. Bowel sounds are normal. There is no distension.     Palpations: Abdomen is soft.     Tenderness: There is no  abdominal tenderness.  Musculoskeletal:        General: No swelling. Normal range of motion.     Cervical back: Normal range of motion.     Right lower leg: No edema.     Left lower leg: No edema.  Lymphadenopathy:     Cervical: No cervical adenopathy.  Skin:    General: Skin is warm and dry.     Capillary Refill: Capillary refill takes less than 2 seconds.     Coloration: Skin is not jaundiced.  Neurological:     General: No focal deficit present.     Mental Status: She is alert and oriented to person, place, and time.  Psychiatric:        Mood and Affect: Mood normal.        Behavior: Behavior normal.     Assessment & Plan:   Problem List Items Addressed This Visit       Chronic obstructive airway disease (HCC)    She and has a history of COPD.  No PFTs on file for review.  She is prescribed an albuterol inhaler for as needed use.  Does not use it regularly.  She is asymptomatic currently and her pulmonary exam is unremarkable.      Tobacco use disorder    She reports smoking 0.5 packs/day of cigarettes and has been smoking for 20 years.  She is precontemplative with regards to cessation at this time.      Depression with anxiety    She is currently prescribed Xanax 1 mg twice daily for management of anxiety.  Feels today as though her anxiety is poorly controlled.  She is interested in other treatment options. -Start duloxetine 30 mg daily for treatment of both anxiety/depression and chronic musculoskeletal pain -Controlled substance agreement and UDS pending.      Chronic musculoskeletal pain    Mostly located in her neck and shoulders.  She is taking meloxicam and Flexeril as needed for pain relief. -  Add Cymbalta 30 mg daily for treatment of chronic musculoskeletal pain.      Prediabetes    A1c 6.0 on labs from May.  Lifestyle modifications aimed at weight loss and reducing her blood sugar were reviewed today.      Elevated blood pressure reading    BP is  elevated today, 161/112.  She is unaware of any prior history of hypertension, though it appears she has previously been prescribed losartan. -No medication changes today.  Follow-up in 4 weeks for BP check.  Discuss starting antihypertensive therapy if her blood pressure remains elevated at that time.      Obesity    Her weight today is 226 pounds.  She is concerned about her weight and has attempted to make lifestyle changes over the last 3 months in an effort to lose weight.  She has lost 20 pounds as a result.  She is also interested in medication options for weight loss and was previously prescribed phentermine. -Left modifications aimed at weight loss were reviewed today -Follow-up in 4 weeks to discuss medication options for weight loss      Colon cancer screening - Primary    Cologuard order today for colon cancer screening      Return in about 4 weeks (around 12/14/2022).   Billie Lade, MD

## 2022-11-16 NOTE — Patient Instructions (Signed)
It was a pleasure to see you today.  Thank you for giving [place] the opportunity to be involved in your care.  Below is a brief recap of your visit and next steps.  We will plan to see you again in 4 weeks.  Summary You have established care today Start Cymbalta for anxiety and chronic pain relief Cologuard ordered for colon cancer screening Follow up in 4 weeks for BP check and to discuss medication options for weight loss

## 2022-11-19 LAB — TOXASSURE SELECT 13 (MW), URINE

## 2022-12-03 DIAGNOSIS — R7303 Prediabetes: Secondary | ICD-10-CM | POA: Insufficient documentation

## 2022-12-03 DIAGNOSIS — R030 Elevated blood-pressure reading, without diagnosis of hypertension: Secondary | ICD-10-CM | POA: Insufficient documentation

## 2022-12-03 DIAGNOSIS — J449 Chronic obstructive pulmonary disease, unspecified: Secondary | ICD-10-CM | POA: Insufficient documentation

## 2022-12-03 DIAGNOSIS — G8929 Other chronic pain: Secondary | ICD-10-CM | POA: Insufficient documentation

## 2022-12-03 DIAGNOSIS — E669 Obesity, unspecified: Secondary | ICD-10-CM | POA: Insufficient documentation

## 2022-12-03 DIAGNOSIS — Z1211 Encounter for screening for malignant neoplasm of colon: Secondary | ICD-10-CM | POA: Insufficient documentation

## 2022-12-03 NOTE — Assessment & Plan Note (Signed)
She and has a history of COPD.  No PFTs on file for review.  She is prescribed an albuterol inhaler for as needed use.  Does not use it regularly.  She is asymptomatic currently and her pulmonary exam is unremarkable.

## 2022-12-03 NOTE — Assessment & Plan Note (Signed)
BP is elevated today, 161/112.  She is unaware of any prior history of hypertension, though it appears she has previously been prescribed losartan. -No medication changes today.  Follow-up in 4 weeks for BP check.  Discuss starting antihypertensive therapy if her blood pressure remains elevated at that time.

## 2022-12-03 NOTE — Assessment & Plan Note (Signed)
Her weight today is 226 pounds.  She is concerned about her weight and has attempted to make lifestyle changes over the last 3 months in an effort to lose weight.  She has lost 20 pounds as a result.  She is also interested in medication options for weight loss and was previously prescribed phentermine. -Left modifications aimed at weight loss were reviewed today -Follow-up in 4 weeks to discuss medication options for weight loss

## 2022-12-03 NOTE — Assessment & Plan Note (Addendum)
Cologuard order today for colon cancer screening

## 2022-12-03 NOTE — Assessment & Plan Note (Signed)
Mostly located in her neck and shoulders.  She is taking meloxicam and Flexeril as needed for pain relief. -Add Cymbalta 30 mg daily for treatment of chronic musculoskeletal pain.

## 2022-12-03 NOTE — Assessment & Plan Note (Signed)
A1c 6.0 on labs from May.  Lifestyle modifications aimed at weight loss and reducing her blood sugar were reviewed today.

## 2022-12-03 NOTE — Assessment & Plan Note (Signed)
She reports smoking 0.5 packs/day of cigarettes and has been smoking for 20 years.  She is precontemplative with regards to cessation at this time.

## 2022-12-03 NOTE — Assessment & Plan Note (Addendum)
She is currently prescribed Xanax 1 mg twice daily for management of anxiety.  Feels today as though her anxiety is poorly controlled.  She is interested in other treatment options. -Start duloxetine 30 mg daily for treatment of both anxiety/depression and chronic musculoskeletal pain -Controlled substance agreement and UDS pending.

## 2022-12-07 DIAGNOSIS — Z1211 Encounter for screening for malignant neoplasm of colon: Secondary | ICD-10-CM | POA: Diagnosis not present

## 2022-12-07 LAB — COLOGUARD: Cologuard: NEGATIVE

## 2022-12-15 LAB — COLOGUARD: COLOGUARD: NEGATIVE

## 2022-12-17 ENCOUNTER — Other Ambulatory Visit: Payer: Self-pay | Admitting: Internal Medicine

## 2022-12-21 ENCOUNTER — Ambulatory Visit: Payer: Medicaid Other | Admitting: Internal Medicine

## 2022-12-21 ENCOUNTER — Encounter: Payer: Self-pay | Admitting: Internal Medicine

## 2022-12-21 VITALS — BP 159/104 | HR 89 | Temp 98.5°F | Resp 20 | Ht 64.0 in | Wt 223.2 lb

## 2022-12-21 DIAGNOSIS — F418 Other specified anxiety disorders: Secondary | ICD-10-CM

## 2022-12-21 DIAGNOSIS — R7303 Prediabetes: Secondary | ICD-10-CM

## 2022-12-21 DIAGNOSIS — Z79899 Other long term (current) drug therapy: Secondary | ICD-10-CM

## 2022-12-21 DIAGNOSIS — I10 Essential (primary) hypertension: Secondary | ICD-10-CM | POA: Diagnosis not present

## 2022-12-21 DIAGNOSIS — E66812 Obesity, class 2: Secondary | ICD-10-CM

## 2022-12-21 DIAGNOSIS — Z6838 Body mass index (BMI) 38.0-38.9, adult: Secondary | ICD-10-CM | POA: Diagnosis not present

## 2022-12-21 MED ORDER — OLMESARTAN MEDOXOMIL 20 MG PO TABS: 20.0000 mg | ORAL_TABLET | Freq: Every day | ORAL | 2 refills | Status: DC

## 2022-12-21 MED ORDER — SEMAGLUTIDE-WEIGHT MANAGEMENT 0.25 MG/0.5ML ~~LOC~~ SOAJ: 0.2500 mg | SUBCUTANEOUS | 0 refills | Status: DC

## 2022-12-21 NOTE — Progress Notes (Signed)
Established Patient Office Visit  Subjective   Patient ID: Michelle Payne, female    DOB: [birth year]  Age: 47 y.o. MRN: [id number]  Chief Complaint  Patient presents with   Hypertension   Obesity   Medication Refill   Michelle Payne returns to care today for weight loss management, BP check, and management of anxiety and depression.  She was last evaluated by me on 10/8 as a new patient presenting to establish care.  She had multiple concerns to discuss at that time.  Cymbalta 30 mg daily was started for treatment of anxiety and depression as well as chronic musculoskeletal pain.  She also expressed an interest in starting medication for weight loss.  4-week follow-up was arranged.  There have been no acute interval events. [name] reports feeling fairly well today.  She is asymptomatic and has no acute concerns to discuss.   Past Medical History:  Diagnosis Date   Anxiety    Arthritis    knees   Bipolar 2 disorder (HCC)    Carpal tunnel syndrome on left 02/2016   COPD (chronic obstructive pulmonary disease) (HCC)    denies home O2   Depression    depression, bipolar   Headache    unspecified; 2 x/week   Past Surgical History:  Procedure Laterality Date   CARPAL TUNNEL RELEASE Left 02/24/2016   Procedure: LEFT CARPAL TUNNEL RELEASE;  Surgeon: Betha Loa, MD;  Location:  [facility];  Service: Orthopedics;  Laterality: Left;   TOOTH EXTRACTION     WISDOM TOOTH EXTRACTION     Social History   Tobacco Use   Smoking status: Every Day    Current packs/day: 0.00    Average packs/day: 0.3 packs/day for 15.0 years (3.8 ttl pk-yrs)    Types: Cigarettes    Start date: 07/29/2001    Last attempt to quit: 07/29/2016    Years since quitting: 6.4   Smokeless tobacco: Never  Vaping Use   Vaping status: Never Used  Substance Use Topics   Alcohol use: No   Drug use: No    Types: Cocaine, Marijuana    Comment: last used 06/09/16   Family History  Problem Relation Age of  Onset   Diabetes Paternal Grandmother    Heart disease Maternal Grandmother    Cancer Maternal Grandfather    Hypertension Mother    ADD / ADHD Son    ADD / ADHD Son    Allergies  Allergen Reactions   Bee Venom Shortness Of Breath and Swelling   Mixed Ragweed Itching   Review of Systems  Constitutional:  Negative for chills and fever.  HENT:  Negative for sore throat.   Respiratory:  Negative for cough and shortness of breath.   Cardiovascular:  Negative for chest pain, palpitations and leg swelling.  Gastrointestinal:  Negative for abdominal pain, blood in stool, constipation, diarrhea, nausea and vomiting.  Genitourinary:  Negative for dysuria and hematuria.  Musculoskeletal:  Negative for myalgias.  Skin:  Negative for itching and rash.  Neurological:  Negative for dizziness and headaches.  Psychiatric/Behavioral:  Negative for depression and suicidal ideas.      Objective:     BP (!) 159/104   Pulse 89   Temp 98.5 F (36.9 C) (Oral)   Resp 20   Ht 5\' 4"  (1.626 m)   Wt 223 lb 4 oz (101.3 kg)   SpO2 98%   BMI 38.32 kg/m  BP Readings from Last 3 Encounters:  12/21/22 (!) 159/104  11/16/22 (!) 161/112  04/26/22 (!) 152/103   Physical Exam Vitals reviewed.  Constitutional:      General: She is not in acute distress.    Appearance: Normal appearance. She is obese. She is not toxic-appearing.  HENT:     Head: Normocephalic and atraumatic.     Right Ear: External ear normal.     Left Ear: External ear normal.     Nose: Nose normal. No congestion or rhinorrhea.     Mouth/Throat:     Mouth: Mucous membranes are moist.     Pharynx: Oropharynx is clear. No oropharyngeal exudate or posterior oropharyngeal erythema.  Eyes:     General: No scleral icterus.    Extraocular Movements: Extraocular movements intact.     Conjunctiva/sclera: Conjunctivae normal.     Pupils: Pupils are equal, round, and reactive to light.  Cardiovascular:     Rate and Rhythm: Normal rate  and regular rhythm.     Pulses: Normal pulses.     Heart sounds: Normal heart sounds. No murmur heard.    No friction rub. No gallop.  Pulmonary:     Effort: Pulmonary effort is normal.     Breath sounds: Normal breath sounds. No wheezing, rhonchi or rales.  Abdominal:     General: Abdomen is flat. Bowel sounds are normal. There is no distension.     Palpations: Abdomen is soft.     Tenderness: There is no abdominal tenderness.  Musculoskeletal:        General: No swelling. Normal range of motion.     Cervical back: Normal range of motion.     Right lower leg: No edema.     Left lower leg: No edema.  Lymphadenopathy:     Cervical: No cervical adenopathy.  Skin:    General: Skin is warm and dry.     Capillary Refill: Capillary refill takes less than 2 seconds.     Coloration: Skin is not jaundiced.  Neurological:     General: No focal deficit present.     Mental Status: She is alert and oriented to person, place, and time.  Psychiatric:        Mood and Affect: Mood normal.        Behavior: Behavior normal.   Last CBC Lab Results  Component Value Date   WBC 11.1 (H) 02/20/2018   HGB 12.8 02/20/2018   HCT 37.8 02/20/2018   MCV 90 02/20/2018   MCH 30.3 02/20/2018   RDW 13.2 02/20/2018   PLT 272 02/20/2018   Last metabolic panel Lab Results  Component Value Date   GLUCOSE 91 02/20/2018   NA 140 02/20/2018   K 4.4 02/20/2018   CL 102 02/20/2018   CO2 21 02/20/2018   BUN 11 02/20/2018   CREATININE 0.76 02/20/2018   EGFR 80.0 06/25/2022   CALCIUM 9.1 02/20/2018   PROT 6.8 02/20/2018   ALBUMIN 4.1 02/20/2018   LABGLOB 2.7 02/20/2018   AGRATIO 1.5 02/20/2018   BILITOT 0.2 02/20/2018   ALKPHOS 141 (H) 02/20/2018   AST 11 02/20/2018   ALT 11 02/20/2018   ANIONGAP 10 06/09/2016     Assessment & Plan:   Problem List Items Addressed This Visit       Essential hypertension    New diagnosis, meeting criteria as her blood pressure remains elevated today.  Olmesartan  20 mg daily has been prescribed.  She will follow-up in 2-4 weeks for BP check.      Depression with anxiety  She reports today that her mood is stable and anxiety fairly well-controlled since starting duloxetine.  She is not interested in making any medication adjustments at this time. -She is also prescribed Xanax 1 mg twice daily as needed for anxiety relief.  At her last appointment she signed a controlled substance agreement and completed UDS, which was positive for hydrocodone.  PDMP reviewed again today.  She is not prescribed hydrocodone and reports that she took a tablet prescribed to her son after his wisdom teeth were extracted. Ms. Cowdrey recently had several teeth extracted and took hydrocodone for severe pain relief.  I reviewed with Ms. Nicolaus that I will not refill Xanax until she provides a UDS that shows anticipated results.  Any future violations of her controlled substance agreement will result in discontinuation of her prescription.  She expresses understanding.  Xanax refill is pending completion of repeat UDS.      Obesity    Her weight today is 223 pounds.  She remains concerned about her weight and is interested in medication options for weight loss.  She has previously made significant lifestyle changes in an effort to lose weight and has lost 23 pounds as a result.  She was previously prescribed phentermine, but this was discontinued by her previous PCP as it worsened her anxiety. -Through shared decision making, Wegovy 0.25 mg weekly has been prescribed today.  Lifestyle modifications aimed at weight loss were reinforced.  She will follow-up in 3 months for reassessment.      Return in about 3 months (around 03/23/2023).   Billie Lade, MD

## 2022-12-21 NOTE — Assessment & Plan Note (Signed)
Her weight today is 223 pounds.  She remains concerned about her weight and is interested in medication options for weight loss.  She has previously made significant lifestyle changes in an effort to lose weight and has lost 23 pounds as a result.  She was previously prescribed phentermine, but this was discontinued by her previous PCP as it worsened her anxiety. -Through shared decision making, Wegovy 0.25 mg weekly has been prescribed today.  Lifestyle modifications aimed at weight loss were reinforced.  She will follow-up in 3 months for reassessment.

## 2022-12-21 NOTE — Patient Instructions (Signed)
It was a pleasure to see you today.  Thank you for giving [place] the opportunity to be involved in your care.  Below is a brief recap of your visit and next steps.  We will plan to see you again in 3 months.  Summary Start olmesartan 20 mg daily for treatment of hypertension Start [facility] for weight loss I will not fill any prescriptions for Xanax until you provide a urine drug screen with expected results Nurse visit in 2-4 weeks for BP check, routine follow up with me in 3 months

## 2022-12-21 NOTE — Assessment & Plan Note (Signed)
New diagnosis, meeting criteria as her blood pressure remains elevated today.  Olmesartan 20 mg daily has been prescribed.  She will follow-up in 2-4 weeks for BP check.

## 2022-12-21 NOTE — Assessment & Plan Note (Signed)
She reports today that her mood is stable and anxiety fairly well-controlled since starting duloxetine.  She is not interested in making any medication adjustments at this time. -She is also prescribed Xanax 1 mg twice daily as needed for anxiety relief.  At her last appointment she signed a controlled substance agreement and completed UDS, which was positive for hydrocodone.  PDMP reviewed again today.  She is not prescribed hydrocodone and reports that she took a tablet prescribed to her son after his wisdom teeth were extracted. Ms. Cromley recently had several teeth extracted and took hydrocodone for severe pain relief.  I reviewed with Ms. Stutzman that I will not refill Xanax until she provides a UDS that shows anticipated results.  Any future violations of her controlled substance agreement will result in discontinuation of her prescription.  She expresses understanding.  Xanax refill is pending completion of repeat UDS.

## 2023-01-11 ENCOUNTER — Ambulatory Visit: Payer: Medicaid Other

## 2023-01-11 DIAGNOSIS — Z79899 Other long term (current) drug therapy: Secondary | ICD-10-CM | POA: Diagnosis not present

## 2023-01-11 DIAGNOSIS — F418 Other specified anxiety disorders: Secondary | ICD-10-CM | POA: Diagnosis not present

## 2023-01-13 ENCOUNTER — Other Ambulatory Visit: Payer: Self-pay | Admitting: Internal Medicine

## 2023-01-13 LAB — TOXASSURE SELECT 13 (MW), URINE

## 2023-01-13 MED ORDER — ALPRAZOLAM 1 MG PO TABS: 1.0000 mg | ORAL_TABLET | Freq: Two times a day (BID) | ORAL | 0 refills | Status: DC | PRN

## 2023-01-13 NOTE — Telephone Encounter (Signed)
Copied from CRM [id number]. Topic: Clinical - Medication Refill >> Jan 13, 2023  1:53 PM Gaetano Hawthorne wrote: Most Recent Primary Care Visit:  Provider: Jasper Riling  Department: [facility]  Visit Type: CLINICAL SUPPORT  Date: 01/11/2023  Medication: ALPRAZolam Prudy Feeler) 1 MG tablet   Has the patient contacted their pharmacy? Yes (Agent: If no, request that the patient contact the pharmacy for the refill. If patient does not wish to contact the pharmacy document the reason why and proceed with request.) (Agent: If yes, when and what did the pharmacy advise?)  Is this the correct pharmacy for this prescription? Yes If no, delete pharmacy and type the correct one.  This is the patient's preferred pharmacy:  [id number] - [place], [place] - [address] AT [facility] [address] [place] [address] Phone: [phone] Fax: [phone]   Has the prescription been filled recently? No  Is the patient out of the medication? Yes  Has the patient been seen for an appointment in the last year OR does the patient have an upcoming appointment? Yes  Can we respond through MyChart? Yes  Agent: Please be advised that Rx refills may take up to 3 business days. We ask that you follow-up with your pharmacy.

## 2023-01-14 ENCOUNTER — Other Ambulatory Visit: Payer: Self-pay | Admitting: Women's Health

## 2023-01-16 ENCOUNTER — Other Ambulatory Visit: Payer: Self-pay | Admitting: Internal Medicine

## 2023-01-16 DIAGNOSIS — E66812 Obesity, class 2: Secondary | ICD-10-CM

## 2023-01-16 DIAGNOSIS — R7303 Prediabetes: Secondary | ICD-10-CM

## 2023-01-26 ENCOUNTER — Telehealth: Payer: Self-pay | Admitting: Internal Medicine

## 2023-01-26 DIAGNOSIS — E66812 Obesity, class 2: Secondary | ICD-10-CM

## 2023-01-26 DIAGNOSIS — R7303 Prediabetes: Secondary | ICD-10-CM

## 2023-01-26 NOTE — Telephone Encounter (Unsigned)
Copied from CRM [id number]. Topic: Clinical - Medication Question >> Jan 26, 2023  1:59 PM Mosetta Putt H wrote: Reason for CRM: patient wants to know if dosage of wegovy is supposed to change

## 2023-01-27 MED ORDER — SEMAGLUTIDE-WEIGHT MANAGEMENT 0.5 MG/0.5ML ~~LOC~~ SOAJ: 0.5000 mg | SUBCUTANEOUS | 0 refills | Status: DC

## 2023-01-27 NOTE — Addendum Note (Signed)
Addended by: Christel Mormon E on: 01/27/2023 12:09 PM   Modules accepted: Orders

## 2023-02-15 ENCOUNTER — Ambulatory Visit: Payer: Self-pay | Admitting: Internal Medicine

## 2023-02-15 MED ORDER — ALPRAZOLAM 1 MG PO TABS: 1.0000 mg | ORAL_TABLET | Freq: Two times a day (BID) | ORAL | 0 refills | Status: DC | PRN

## 2023-02-15 NOTE — Telephone Encounter (Signed)
 Routed to office for refill. Dr Melvenia hs prescribed previously.    Copied from CRM [id number]. Topic: Clinical - Medication Refill >> Feb 15, 2023 10:43 AM Rodell CROME wrote: Most Recent Primary Care Visit:  Provider: JACKOLYN IZETTA SAILOR  Department: [facility]  Visit Type: CLINICAL SUPPORT  Date: 01/11/2023  Medication: ALPRAZolam  (XANAX ) 1 MG tablet olmesartan  (BENICAR ) 20 MG tablet  Has the patient contacted their pharmacy? NO (Agent: If no, request that the patient contact the pharmacy for the refill. If patient does not wish to contact the pharmacy document the reason why and proceed with request.) (Agent: If yes, when and what did the pharmacy advise?)  Is this the correct pharmacy for this prescription? YES If no, delete pharmacy and type the correct one.  This is the patient's preferred pharmacy:  [id number] [place], [place] - [address] AT [facility] [address] [place] [address] Phone: [phone] Fax: [phone]   Has the prescription been filled recently? YES Is the patient out of the medication?YES  Has the patient been seen for an appointment in the last year OR does the patient have an upcoming appointment? YES  Can we respond through MyChart? YES  Agent: Please be advised that Rx refills may take up to 3 business days. We ask that you follow-up with your pharmacy. Answer Assessment - Initial Assessment Questions 1. DRUG NAME: What medicine do you need to have refilled?     xanax  2. REFILLS REMAINING: How many refills are remaining? (Note: The label on the medicine or pill bottle will show how many refills are remaining. If there are no refills remaining, then a renewal may be needed.)     unsure 3. EXPIRATION DATE: What is the expiration date? (Note: The label states when the prescription will expire, and thus can no longer be refilled.)    unsure 4. PRESCRIBING HCP: Who prescribed it? Reason: If prescribed by specialist, call  should be referred to that group.     [place] 5. SYMPTOMS: Do you have any symptoms?     na 6. PREGNANCY: Is there any chance that you are pregnant? When was your last menstrual period?     na  Protocols used: Medication Refill and Renewal Call-A-AH

## 2023-02-17 ENCOUNTER — Other Ambulatory Visit: Payer: Self-pay | Admitting: Internal Medicine

## 2023-02-17 DIAGNOSIS — R7303 Prediabetes: Secondary | ICD-10-CM

## 2023-02-17 MED ORDER — SEMAGLUTIDE-WEIGHT MANAGEMENT 0.5 MG/0.5ML ~~LOC~~ SOAJ: 0.5000 mg | SUBCUTANEOUS | 0 refills | Status: AC

## 2023-02-17 NOTE — Telephone Encounter (Signed)
 Copied from CRM [id number]. Topic: Clinical - Medication Refill >> Feb 17, 2023 11:10 AM Carlatta H wrote: Most Recent Primary Care Visit:  Provider: JACKOLYN IZETTA SAILOR  Department: [facility]  Visit Type: CLINICAL SUPPORT  Date: 01/11/2023  Medication: Semaglutide -Weight Management 0.5 MG/0.5ML SOAJ [[id number]]  Has the patient contacted their pharmacy? Yes (Agent: If no, request that the patient contact the pharmacy for the refill. If patient does not wish to contact the pharmacy document the reason why and proceed with request.) (Agent: If yes, when and what did the pharmacy advise?)  Is this the correct pharmacy for this prescription? Yes If no, delete pharmacy and type the correct one.  This is the patient's preferred pharmacy:  [id number] [place], [place] - [address] AT [facility] [address] [place] [address] Phone: [phone] Fax: [phone]   Has the prescription been filled recently? No  Is the patient out of the medication? No  Has the patient been seen for an appointment in the last year OR does the patient have an upcoming appointment? Yes  Can we respond through MyChart? Yes  Agent: Please be advised that Rx refills may take up to 3 business days. We ask that you follow-up with your pharmacy.

## 2023-03-04 ENCOUNTER — Other Ambulatory Visit: Payer: Self-pay | Admitting: Internal Medicine

## 2023-03-04 DIAGNOSIS — F411 Generalized anxiety disorder: Secondary | ICD-10-CM

## 2023-03-04 DIAGNOSIS — M7918 Myalgia, other site: Secondary | ICD-10-CM

## 2023-03-04 NOTE — Telephone Encounter (Signed)
Copied from CRM [id number]. Topic: Clinical - Medication Refill >> Mar 04, 2023  1:23 PM Ivette P wrote: Most Recent Primary Care Visit:  Provider: Jasper Riling  Department: [facility]  Visit Type: CLINICAL SUPPORT  Date: 01/11/2023  Medication: DULoxetine (CYMBALTA) 30 MG capsule  Has the patient contacted their pharmacy? Yes (Agent: If no, request that the patient contact the pharmacy for the refill. If patient does not wish to contact the pharmacy document the reason why and proceed with request.) (Agent: If yes, when and what did the pharmacy advise?)  Is this the correct pharmacy for this prescription? Yes If no, delete pharmacy and type the correct one.  This is the patient's preferred pharmacy:  [id number] - [place], [place] - [address] AT [facility] [address] [place] [address] Phone: [phone] Fax: [phone]   Has the prescription been filled recently? No  Is the patient out of the medication? Yes  Has the patient been seen for an appointment in the last year OR does the patient have an upcoming appointment? Yes  Can we respond through MyChart? Yes  Agent: Please be advised that Rx refills may take up to 3 business days. We ask that you follow-up with your pharmacy.

## 2023-03-04 NOTE — Telephone Encounter (Unsigned)
Copied from CRM [id number]. Topic: Clinical - Medication Refill >> Mar 04, 2023  1:23 PM Ivette P wrote: Most Recent Primary Care Visit:  Provider: Jasper Riling  Department: [facility]  Visit Type: CLINICAL SUPPORT  Date: 01/11/2023  Medication: DULoxetine (CYMBALTA) 30 MG capsule  Has the patient contacted their pharmacy? Yes (Agent: If no, request that the patient contact the pharmacy for the refill. If patient does not wish to contact the pharmacy document the reason why and proceed with request.) (Agent: If yes, when and what did the pharmacy advise?)  Is this the correct pharmacy for this prescription? Yes If no, delete pharmacy and type the correct one.  This is the patient's preferred pharmacy:  [id number] [place], [place] - [address] AT [facility] [address] [place] [address] Phone: [phone] Fax: [phone]   Has the prescription been filled recently? No  Is the patient out of the medication? Yes  Has the patient been seen for an appointment in the last year OR does the patient have an upcoming appointment? Yes  Can we respond through MyChart? Yes  Agent: Please be advised that Rx refills may take up to 3 business days. We ask that you follow-up with your pharmacy.

## 2023-03-07 ENCOUNTER — Telehealth: Payer: Self-pay

## 2023-03-07 MED ORDER — DULOXETINE HCL 30 MG PO CPEP: 30.0000 mg | ORAL_CAPSULE | Freq: Every day | ORAL | 3 refills | Status: DC

## 2023-03-07 NOTE — Telephone Encounter (Signed)
Spoke to patient

## 2023-03-07 NOTE — Telephone Encounter (Signed)
Copied from CRM [id number]. Topic: Clinical - Medication Question >> Mar 04, 2023  1:20 PM Ivette P wrote: Reason for CRM: Pt is currently taking medication and when she went for a refill she was given same dosage as before, pt believed that dosage was supposed to go up in dose, Pt has some further question would like a call back [phone]

## 2023-03-17 ENCOUNTER — Other Ambulatory Visit: Payer: Self-pay | Admitting: Internal Medicine

## 2023-03-17 DIAGNOSIS — R7303 Prediabetes: Secondary | ICD-10-CM

## 2023-03-17 DIAGNOSIS — I10 Essential (primary) hypertension: Secondary | ICD-10-CM

## 2023-03-17 MED ORDER — ALPRAZOLAM 1 MG PO TABS: 1.0000 mg | ORAL_TABLET | Freq: Two times a day (BID) | ORAL | 0 refills | Status: DC | PRN

## 2023-03-17 NOTE — Telephone Encounter (Signed)
 Copied from CRM [id number]. Topic: Clinical - Medication Refill >> Mar 17, 2023  7:36 AM Benton KIDD wrote: Most Recent Primary Care Visit:  Provider: JACKOLYN IZETTA SAILOR  Department: [facility]  Visit Type: CLINICAL SUPPORT  Date: 01/11/2023  Medication: ALPRAZolam  (XANAX ) 1 MG tablet   Has the patient contacted their pharmacy? Yes no refills (Agent: If no, request that the patient contact the pharmacy for the refill. If patient does not wish to contact the pharmacy document the reason why and proceed with request.) (Agent: If yes, when and what did the pharmacy advise?)  Is this the correct pharmacy for this prescription? Yes If no, delete pharmacy and type the correct one.  This is the patient's preferred pharmacy:  [id number] - [place], [place] - [address] AT [facility] [address] [place] [address] Phone: [phone] Fax: [phone]   Has the prescription been filled recently? No  Is the patient out of the medication? Yes  Has the patient been seen for an appointment in the last year OR does the patient have an upcoming appointment? Yes  Can we respond through MyChart? Yes  Agent: Please be advised that Rx refills may take up to 3 business days. We ask that you follow-up with your pharmacy.

## 2023-03-20 ENCOUNTER — Other Ambulatory Visit: Payer: Self-pay | Admitting: Internal Medicine

## 2023-03-20 DIAGNOSIS — E66812 Obesity, class 2: Secondary | ICD-10-CM

## 2023-03-20 DIAGNOSIS — R7303 Prediabetes: Secondary | ICD-10-CM

## 2023-03-22 ENCOUNTER — Ambulatory Visit: Payer: Medicaid Other | Admitting: Internal Medicine

## 2023-03-28 ENCOUNTER — Other Ambulatory Visit: Payer: Self-pay | Admitting: Internal Medicine

## 2023-03-28 DIAGNOSIS — G8929 Other chronic pain: Secondary | ICD-10-CM

## 2023-03-28 DIAGNOSIS — F411 Generalized anxiety disorder: Secondary | ICD-10-CM

## 2023-03-28 MED ORDER — DULOXETINE HCL 30 MG PO CPEP: 30.0000 mg | ORAL_CAPSULE | Freq: Every day | ORAL | 3 refills | Status: DC

## 2023-03-28 NOTE — Telephone Encounter (Signed)
Copied from CRM [id number]. Topic: Clinical - Medication Refill >> Mar 28, 2023 10:09 AM Fuller Mandril wrote: Most Recent Primary Care Visit:  Provider: Jasper Riling  Department: [facility]  Visit Type: CLINICAL SUPPORT  Date: 01/11/2023  Medication: DULoxetine (CYMBALTA) 30 MG capsule  Has the patient contacted their pharmacy? No (Agent: If no, request that the patient contact the pharmacy for the refill. If patient does not wish to contact the pharmacy document the reason why and proceed with request.) (Agent: If yes, when and what did the pharmacy advise?)  Is this the correct pharmacy for this prescription? Yes If no, delete pharmacy and type the correct one.  This is the patient's preferred pharmacy:  [id number] [place], [place] - [address] AT [facility] [address] [place] [address] Phone: [phone] Fax: [phone]   Has the prescription been filled recently? N/A  Is the patient out of the medication? No but may be prior to next appt   Has the patient been seen for an appointment in the last year OR does the patient have an upcoming appointment? Yes  Can we respond through MyChart? Yes  Agent: Please be advised that Rx refills may take up to 3 business days. We ask that you follow-up with your pharmacy.

## 2023-03-30 ENCOUNTER — Encounter: Payer: Self-pay | Admitting: Internal Medicine

## 2023-03-30 ENCOUNTER — Telehealth (INDEPENDENT_AMBULATORY_CARE_PROVIDER_SITE_OTHER): Payer: Medicaid Other | Admitting: Internal Medicine

## 2023-03-30 VITALS — Ht 64.0 in

## 2023-03-30 DIAGNOSIS — I10 Essential (primary) hypertension: Secondary | ICD-10-CM | POA: Diagnosis not present

## 2023-03-30 DIAGNOSIS — F418 Other specified anxiety disorders: Secondary | ICD-10-CM | POA: Diagnosis not present

## 2023-03-30 DIAGNOSIS — G8929 Other chronic pain: Secondary | ICD-10-CM

## 2023-03-30 DIAGNOSIS — E66812 Obesity, class 2: Secondary | ICD-10-CM

## 2023-03-30 DIAGNOSIS — M7918 Myalgia, other site: Secondary | ICD-10-CM | POA: Diagnosis not present

## 2023-03-30 DIAGNOSIS — Z6838 Body mass index (BMI) 38.0-38.9, adult: Secondary | ICD-10-CM

## 2023-03-30 MED ORDER — DULOXETINE HCL 60 MG PO CPEP: 60.0000 mg | ORAL_CAPSULE | Freq: Every day | ORAL | 3 refills | Status: DC

## 2023-03-30 MED ORDER — CELECOXIB 200 MG PO CAPS: 200.0000 mg | ORAL_CAPSULE | Freq: Two times a day (BID) | ORAL | 1 refills | Status: DC | PRN

## 2023-03-30 NOTE — Assessment & Plan Note (Signed)
Recent diagnosis.  Olmesartan 20 mg daily was started at her last appointment.  She has been checking her blood pressure regularly at home and reports readings consistently around 120/80.  No further changes are indicated today.

## 2023-03-30 NOTE — Assessment & Plan Note (Signed)
Currently prescribed Cymbalta 30 mg daily and alprazolam 1 mg twice daily as needed for anxiety relief.  She feels that anxiety is well-controlled but she has felt more depressed and does not feel that Cymbalta is as effective as it was previously. -Increase Cymbalta to 60 mg daily

## 2023-03-30 NOTE — Assessment & Plan Note (Signed)
She endorses poorly controlled musculoskeletal pain, mostly in her neck and shoulders.  She is prescribed Cymbalta 30 mg daily and has previously been prescribed meloxicam and Flexeril for as needed pain relief. -Increase Cymbalta to 60 mg daily -Add Celebrex 200 mg 3 times daily for as needed pain relief

## 2023-03-30 NOTE — Progress Notes (Signed)
Virtual Visit via Video Note  I connected with Michelle Payne on 03/30/23 at  2:40 PM EST by a video enabled telemedicine application and verified that I am speaking with the correct person using two identifiers.  Patient Location: Home Provider Location: Home Office  I discussed the limitations, risks, security, and privacy concerns of performing an evaluation and management service by video and the availability of in person appointments. I also discussed with the patient that there may be a patient responsible charge related to this service. The patient expressed understanding and agreed to proceed.  Subjective: PCP: Billie Lade, MD  Chief Complaint  Patient presents with   Care Management    3 month f/u. Needs more refills on her medications. Needs to discuss cymbalta.    Michelle Payne has been evaluated today for routine follow-up through video encounter.  She was last evaluated by me in November 2024.  Olmesartan 20 mg daily was started for treatment of hypertension, which was a new diagnosis.  Reginal Lutes was also started in the setting of obesity.  30-month follow-up was arranged for reassessment.  There have been no acute interval events. Michelle Payne reports feeling fairly well today.  She endorses poorly controlled symptoms of anxiety and depression and is interested in increasing Cymbalta to 60 mg daily.  She also endorses chronic musculoskeletal pain.  Most recently treated with as needed use of meloxicam.  She would like to discuss medication options for as needed pain control.  She is currently prescribed Wegovy 0.5 mg weekly and reports that her most recent weight was 216 pounds, which is down from 223 pounds at her last appointment.  ROS: Per HPI  Current Outpatient Medications:    albuterol (PROVENTIL HFA;VENTOLIN HFA) 108 (90 Base) MCG/ACT inhaler, Inhale into the lungs every 6 (six) hours as needed for wheezing or shortness of breath., Disp: , Rfl:    ALPRAZolam (XANAX) 1 MG  tablet, Take 1 tablet (1 mg total) by mouth 2 (two) times daily as needed for anxiety., Disp: 60 tablet, Rfl: 0   celecoxib (CELEBREX) 200 MG capsule, Take 1 capsule (200 mg total) by mouth 2 (two) times daily as needed for moderate pain (pain score 4-6)., Disp: 60 capsule, Rfl: 1   DULoxetine (CYMBALTA) 60 MG capsule, Take 1 capsule (60 mg total) by mouth daily., Disp: 90 capsule, Rfl: 3   norethindrone (MICRONOR) 0.35 MG tablet, TAKE 1 TABLET BY MOUTH EVERY DAY, Disp: 84 tablet, Rfl: 3   olmesartan (BENICAR) 20 MG tablet, TAKE 1 TABLET BY MOUTH EVERY DAY, Disp: 30 tablet, Rfl: 2  Observations/Objective: Today's Vitals   03/30/23 1356  Height: 5\' 4"  (1.626 m)   Physical Exam  Assessment and Plan:  Chronic musculoskeletal pain Assessment & Plan: She endorses poorly controlled musculoskeletal pain, mostly in her neck and shoulders.  She is prescribed Cymbalta 30 mg daily and has previously been prescribed meloxicam and Flexeril for as needed pain relief. -Increase Cymbalta to 60 mg daily -Add Celebrex 200 mg 3 times daily for as needed pain relief  Depression with anxiety Assessment & Plan: Currently prescribed Cymbalta 30 mg daily and alprazolam 1 mg twice daily as needed for anxiety relief.  She feels that anxiety is well-controlled but she has felt more depressed and does not feel that Cymbalta is as effective as it was previously. -Increase Cymbalta to 60 mg daily  Essential hypertension Assessment & Plan: Recent diagnosis.  Olmesartan 20 mg daily was started at her last appointment.  She has been checking her blood pressure regularly at home and reports readings consistently around 120/80.  No further changes are indicated today.   Class 2 severe obesity due to excess calories with serious comorbidity and body mass index (BMI) of 38.0 to 38.9 in adult [facility]) Assessment & Plan: Reginal Lutes was started at her last appointment.  Her most recent weight was 216 pounds, which is down from  223 pounds at her last appointment.  We will plan to further increase Wegovy to 1 mg weekly after she completes 2 additional injections of 0.5 mg.  Lifestyle modifications aimed at weight loss were reinforced today.  Follow Up Instructions: Return in about 3 months (around 06/27/2023).   I discussed the assessment and treatment plan with the patient. The patient was provided an opportunity to ask questions, and all were answered. The patient agreed with the plan and demonstrated an understanding of the instructions.   The patient was advised to call back or seek an in-person evaluation if the symptoms worsen or if the condition fails to improve as anticipated.  The above assessment and management plan was discussed with the patient. The patient verbalized understanding of and has agreed to the management plan.   Billie Lade, MD

## 2023-03-30 NOTE — Assessment & Plan Note (Signed)
Michelle Payne was started at her last appointment.  Her most recent weight was 216 pounds, which is down from 223 pounds at her last appointment.  We will plan to further increase Wegovy to 1 mg weekly after she completes 2 additional injections of 0.5 mg.  Lifestyle modifications aimed at weight loss were reinforced today.

## 2023-03-31 ENCOUNTER — Ambulatory Visit: Payer: Medicaid Other | Admitting: Internal Medicine

## 2023-04-06 ENCOUNTER — Telehealth: Payer: Self-pay

## 2023-04-06 NOTE — Telephone Encounter (Signed)
 Copied from CRM [id number]. Topic: Clinical - Medication Question >> Apr 06, 2023 11:41 AM [name] wrote: Reason for CRM: Semaglutide-Weight Management 0.5 MG/0.5ML Michelle Payne [[id number]] ENDED Patient was told to call in for Dr. Durwin Nora after she finished the current dose so he can prescribe a lower dosing.  Call back: [phone]

## 2023-04-11 MED ORDER — WEGOVY 1 MG/0.5ML ~~LOC~~ SOAJ: 1.0000 mg | SUBCUTANEOUS | 0 refills | Status: AC

## 2023-04-12 ENCOUNTER — Other Ambulatory Visit: Payer: Self-pay | Admitting: Internal Medicine

## 2023-04-12 NOTE — Telephone Encounter (Signed)
 Patient advised.

## 2023-04-15 ENCOUNTER — Other Ambulatory Visit: Payer: Self-pay | Admitting: Internal Medicine

## 2023-04-28 ENCOUNTER — Ambulatory Visit: Payer: Medicaid Other | Admitting: Women's Health

## 2023-05-13 ENCOUNTER — Other Ambulatory Visit: Payer: Self-pay | Admitting: Internal Medicine

## 2023-05-13 MED ORDER — WEGOVY 1.7 MG/0.75ML ~~LOC~~ SOAJ: 1.7000 mg | SUBCUTANEOUS | 0 refills | Status: DC

## 2023-05-18 ENCOUNTER — Other Ambulatory Visit: Payer: Self-pay | Admitting: Internal Medicine

## 2023-06-12 ENCOUNTER — Other Ambulatory Visit: Payer: Self-pay | Admitting: Internal Medicine

## 2023-06-12 DIAGNOSIS — E66812 Obesity, class 2: Secondary | ICD-10-CM

## 2023-06-13 ENCOUNTER — Ambulatory Visit: Admitting: Women's Health

## 2023-06-17 ENCOUNTER — Other Ambulatory Visit: Payer: Self-pay | Admitting: Internal Medicine

## 2023-06-24 ENCOUNTER — Telehealth: Payer: Self-pay | Admitting: Internal Medicine

## 2023-06-24 NOTE — Telephone Encounter (Signed)
 Converted to virtual pt informed

## 2023-06-24 NOTE — Telephone Encounter (Signed)
 Copied from CRM [id number]. Topic: Appointments - Appointment Cancel/Reschedule >> Jun 24, 2023 10:12 AM Oddis Bench wrote: Patient/patient representative is calling to reschedule an appointment. Refer to attachments for appointment information. Patient wanted to make the visit virtual was able to convert to virtual.

## 2023-06-28 ENCOUNTER — Encounter: Payer: Self-pay | Admitting: Internal Medicine

## 2023-06-28 ENCOUNTER — Ambulatory Visit: Payer: Medicaid Other | Admitting: Internal Medicine

## 2023-06-28 ENCOUNTER — Telehealth (INDEPENDENT_AMBULATORY_CARE_PROVIDER_SITE_OTHER): Admitting: Internal Medicine

## 2023-06-28 DIAGNOSIS — M7918 Myalgia, other site: Secondary | ICD-10-CM

## 2023-06-28 DIAGNOSIS — G8929 Other chronic pain: Secondary | ICD-10-CM

## 2023-06-28 DIAGNOSIS — F418 Other specified anxiety disorders: Secondary | ICD-10-CM | POA: Diagnosis not present

## 2023-06-28 DIAGNOSIS — E66812 Obesity, class 2: Secondary | ICD-10-CM

## 2023-06-28 NOTE — Assessment & Plan Note (Signed)
 Cymbalta  was increased to 60 mg daily and Celebrex  200 mg 3 times daily as needed for pain relief added at her last appointment.  Today she reports her pain is largely unchanged but she is able to function.  No additional medication changes have been made for now.  I recommended presenting for in person evaluation if symptoms worsen.

## 2023-06-28 NOTE — Assessment & Plan Note (Signed)
 Wegovy  has gradually titrated up to 2.4 mg weekly.  She reports that her most recent weight was 198 pounds, which is down a total of 25 pounds from 223 pounds when she started Wegovy  last fall.  She was congratulated on her progress and encouraged to maintain lifestyle modifications aimed at weight loss.

## 2023-06-28 NOTE — Progress Notes (Signed)
 Virtual Visit via Video Note  I connected with Michelle Payne on 06/28/23 at  2:40 PM EDT by a video enabled telemedicine application and verified that I am speaking with the correct person using two identifiers.  Patient Location: Home Provider Location: Office/Clinic  I discussed the limitations, risks, security, and privacy concerns of performing an evaluation and management service by video and the availability of in person appointments. I also discussed with the patient that there may be a patient responsible charge related to this service. The patient expressed understanding and agreed to proceed.  Subjective: PCP: Tobi Fortes, MD  Chief Complaint  Patient presents with   Care Management    Three month follow up   Michelle Payne has been evaluated today for routine follow-up through video encounter.  She was last evaluated by me on 2/19 at which time Cymbalta  was increased to 60 mg daily and Celebrex  added for improved management of chronic musculoskeletal pain and depression with anxiety.  No additional medication changes were made and 50-month follow-up was arranged.  There have been no acute interval events.  Today she continues to endorse chronic musculoskeletal pain, largely unchanged since her last appointment.  She states that her mood has improved with increasing Cymbalta .  Her most recent weight was 198 pounds, which is down a total of 25 pounds since starting Wegovy .  She does not have any additional concerns to discuss today.  ROS: Per HPI  Current Outpatient Medications:    albuterol (PROVENTIL HFA;VENTOLIN HFA) 108 (90 Base) MCG/ACT inhaler, Inhale into the lungs every 6 (six) hours as needed for wheezing or shortness of breath., Disp: , Rfl:    ALPRAZolam  (XANAX ) 1 MG tablet, TAKE 1 TABLET BY MOUTH TWICE A DAY AS NEEDED FOR ANXIETY, Disp: 60 tablet, Rfl: 0   celecoxib  (CELEBREX ) 200 MG capsule, Take 1 capsule (200 mg total) by mouth 2 (two) times daily as needed for  moderate pain (pain score 4-6)., Disp: 60 capsule, Rfl: 1   DULoxetine  (CYMBALTA ) 60 MG capsule, Take 1 capsule (60 mg total) by mouth daily., Disp: 90 capsule, Rfl: 3   norethindrone  (MICRONOR ) 0.35 MG tablet, TAKE 1 TABLET BY MOUTH EVERY DAY, Disp: 84 tablet, Rfl: 3   olmesartan  (BENICAR ) 20 MG tablet, TAKE 1 TABLET BY MOUTH EVERY DAY, Disp: 30 tablet, Rfl: 2   Semaglutide -Weight Management (WEGOVY ) 2.4 MG/0.75ML SOAJ, Inject 2.4 mg into the skin once a week., Disp: 3 mL, Rfl: 0  Assessment and Plan:  Chronic musculoskeletal pain Assessment & Plan: Cymbalta  was increased to 60 mg daily and Celebrex  200 mg 3 times daily as needed for pain relief added at her last appointment.  Today she reports her pain is largely unchanged but she is able to function.  No additional medication changes have been made for now.  I recommended presenting for in person evaluation if symptoms worsen.  Depression with anxiety Assessment & Plan: Cymbalta  was increased to 60 mg daily at her last appointment, which she feels has significantly improved her mood.  She continues to take alprazolam  1 mg twice daily as needed for anxiety relief as well.  No additional changes are indicated today.  Class 2 severe obesity due to excess calories with serious comorbidity and body mass index (BMI) of 38.0 to 38.9 in adult [facility]) Assessment & Plan: Wegovy  has gradually titrated up to 2.4 mg weekly.  She reports that her most recent weight was 198 pounds, which is down a total of 25 pounds from  223 pounds when she started Wegovy  last fall.  She was congratulated on her progress and encouraged to maintain lifestyle modifications aimed at weight loss.  Follow Up Instructions: Return in about 3 months (around 09/28/2023).   I discussed the assessment and treatment plan with the patient. The patient was provided an opportunity to ask questions, and all were answered. The patient agreed with the plan and demonstrated an understanding of  the instructions.   The patient was advised to call back or seek an in-person evaluation if the symptoms worsen or if the condition fails to improve as anticipated.  The above assessment and management plan was discussed with the patient. The patient verbalized understanding of and has agreed to the management plan.   Tobi Fortes, MD

## 2023-06-28 NOTE — Assessment & Plan Note (Signed)
 Cymbalta  was increased to 60 mg daily at her last appointment, which she feels has significantly improved her mood.  She continues to take alprazolam  1 mg twice daily as needed for anxiety relief as well.  No additional changes are indicated today.

## 2023-06-29 ENCOUNTER — Ambulatory Visit: Admitting: Advanced Practice Midwife

## 2023-07-12 ENCOUNTER — Telehealth: Payer: Self-pay

## 2023-07-12 ENCOUNTER — Other Ambulatory Visit: Payer: Self-pay | Admitting: Internal Medicine

## 2023-07-12 ENCOUNTER — Other Ambulatory Visit (HOSPITAL_COMMUNITY): Payer: Self-pay

## 2023-07-12 ENCOUNTER — Telehealth: Payer: Self-pay | Admitting: Pharmacy Technician

## 2023-07-12 ENCOUNTER — Ambulatory Visit

## 2023-07-12 NOTE — Telephone Encounter (Signed)
 Copied from CRM [id number]. Topic: General - Other >> Jul 12, 2023  1:39 PM Bridgette Campus T wrote: Reason for CRM: returning call from office, unable to make it today, does not have transportation, wants to know if she can do a virtual visit instead - [phone]

## 2023-07-12 NOTE — Telephone Encounter (Signed)
 Patient to come in for nurse visit for weight check

## 2023-07-12 NOTE — Telephone Encounter (Signed)
 Pharmacy Patient Advocate Encounter   Received notification from CoverMyMeds that prior authorization for Wegovy  2.4MG /0.75ML auto-injectors is required/requested.   Insurance verification completed.   The patient is insured through [facility] .   Per test claim: PA required; PA started via CoverMyMeds. KEY [id number] . Please see clinical question(s) below that I am not finding the answer to in their chart and advise.  Patient needs to scheduled for a weight and BMI check please.

## 2023-07-12 NOTE — Telephone Encounter (Signed)
 Rescheduled to 06.16.2025 at  1 pm.

## 2023-07-14 ENCOUNTER — Other Ambulatory Visit: Payer: Self-pay | Admitting: Internal Medicine

## 2023-07-14 DIAGNOSIS — I10 Essential (primary) hypertension: Secondary | ICD-10-CM

## 2023-07-19 ENCOUNTER — Other Ambulatory Visit: Payer: Self-pay | Admitting: Internal Medicine

## 2023-07-25 ENCOUNTER — Ambulatory Visit

## 2023-07-28 NOTE — Telephone Encounter (Signed)
 Closing encounter and prior authorization until patient comes in for weight and BMI check.

## 2023-08-01 ENCOUNTER — Ambulatory Visit

## 2023-08-01 NOTE — Progress Notes (Signed)
 Patient is in office today for a nurse visit for  Weight Check .

## 2023-08-03 ENCOUNTER — Telehealth: Payer: Self-pay | Admitting: Pharmacy Technician

## 2023-08-03 ENCOUNTER — Other Ambulatory Visit (HOSPITAL_COMMUNITY): Payer: Self-pay

## 2023-08-03 NOTE — Telephone Encounter (Signed)
 Pharmacy Patient Advocate Encounter  Received notification from [facility] that Prior Authorization for Wegovy  2.4MG /0.75ML auto-injectors has been APPROVED from 08/03/2023 to 08/02/2024. Ran test claim, Copay is $4.00. This test claim was processed through [facility]- copay amounts may vary at other pharmacies due to pharmacy/plan contracts, or as the patient moves through the different stages of their insurance plan.   PA #/Case ID/Reference #: [id number]

## 2023-08-03 NOTE — Telephone Encounter (Signed)
 Pharmacy Patient Advocate Encounter   Received notification from CoverMyMeds that prior authorization for Wegovy  2.4MG /0.75ML auto-injectors is required/requested.   Insurance verification completed.   The patient is insured through [facility] .   Per test claim: PA required; PA submitted to above mentioned insurance via CoverMyMeds Key/confirmation #/EOC [id number] Status is pending

## 2023-08-17 ENCOUNTER — Ambulatory Visit: Admitting: Advanced Practice Midwife

## 2023-08-18 ENCOUNTER — Encounter: Payer: Self-pay | Admitting: Adult Health

## 2023-08-18 ENCOUNTER — Ambulatory Visit: Admitting: Adult Health

## 2023-08-18 ENCOUNTER — Other Ambulatory Visit (HOSPITAL_COMMUNITY)
Admission: RE | Admit: 2023-08-18 | Discharge: 2023-08-18 | Disposition: A | Discharge status: Home / Self Care | Source: Ambulatory Visit | Attending: Adult Health | Admitting: Adult Health

## 2023-08-18 VITALS — BP 128/87 | HR 85 | Ht 63.0 in | Wt 195.0 lb

## 2023-08-18 DIAGNOSIS — R3915 Urgency of urination: Secondary | ICD-10-CM

## 2023-08-18 DIAGNOSIS — Z113 Encounter for screening for infections with a predominantly sexual mode of transmission: Secondary | ICD-10-CM | POA: Diagnosis not present

## 2023-08-18 DIAGNOSIS — Z3041 Encounter for surveillance of contraceptive pills: Secondary | ICD-10-CM | POA: Diagnosis not present

## 2023-08-18 DIAGNOSIS — Z01419 Encounter for gynecological examination (general) (routine) without abnormal findings: Secondary | ICD-10-CM | POA: Diagnosis not present

## 2023-08-18 DIAGNOSIS — R350 Frequency of micturition: Secondary | ICD-10-CM | POA: Diagnosis not present

## 2023-08-18 DIAGNOSIS — Z1231 Encounter for screening mammogram for malignant neoplasm of breast: Secondary | ICD-10-CM

## 2023-08-18 DIAGNOSIS — Z1331 Encounter for screening for depression: Secondary | ICD-10-CM

## 2023-08-18 LAB — POCT URINALYSIS DIPSTICK
Blood, UA: NEGATIVE
Glucose, UA: NEGATIVE
Ketones, UA: NEGATIVE
Leukocytes, UA: NEGATIVE
Nitrite, UA: NEGATIVE
Protein, UA: POSITIVE — AB

## 2023-08-18 MED ORDER — NORETHINDRONE 0.35 MG PO TABS: 1.0000 | ORAL_TABLET | Freq: Every day | ORAL | 3 refills | Status: AC

## 2023-08-18 NOTE — Progress Notes (Signed)
 Patient ID: Michelle Payne, female   DOB: February 27, 1975, 48 y.o.   MRN: [id number] History of Present Illness: Michelle Payne is a 48 year old female, divorced, [id number] in for a well woman gyn exam. She is complaining of urnary urgency for a month and has to pee during the night.     Component Value Date/Time   DIAGPAP  04/26/2022 1536    - Negative for intraepithelial lesion or malignancy (NILM)   DIAGPAP  03/17/2020 1517    - Negative for intraepithelial lesion or malignancy (NILM)   DIAGPAP  03/07/2019 1357    - Negative for intraepithelial lesion or malignancy (NILM)   HPVHIGH Negative 04/26/2022 1536   HPVHIGH Negative 03/17/2020 1517   HPVHIGH Positive (A) 03/07/2019 1357   ADEQPAP  04/26/2022 1536    Satisfactory for evaluation; transformation zone component PRESENT.   ADEQPAP  03/17/2020 1517    Satisfactory for evaluation; transformation zone component PRESENT.   ADEQPAP  03/07/2019 1357    Satisfactory for evaluation; transformation zone component ABSENT.    PCP is L Huenink FNP   Current Medications, Allergies, Past Medical History, Past Surgical History, Family History and Social History were reviewed in [employer] record.     Review of Systems: Patient denies any headaches, hearing loss, fatigue, blurred vision, shortness of breath, chest pain, abdominal pain, problems with bowel movements,  or intercourse. No joint pain or mood swings. Happy with BCP See HPI for positives   Physical Exam:BP 128/87 (BP Location: Right Arm, Patient Position: Sitting, Cuff Size: Normal)   Pulse 85   Ht 5' 3 (1.6 m)   Wt 195 lb (88.5 kg)   LMP 08/09/2023 (Approximate)   BMI 34.54 kg/m  urine dipstick trace protein. General:  Well developed, well nourished, no acute distress Skin:  Warm and dry Neck:  Midline trachea, normal thyroid , good ROM, no lymphadenopathy Lungs; Clear to auscultation bilaterally Breast:  No dominant palpable mass, retraction, or nipple  discharge Cardiovascular: Regular rate and rhythm Abdomen:  Soft, non tender, no hepatosplenomegaly Pelvic:  External genitalia is normal in appearance, no lesions.  The vagina is normal in appearance. Urethra has no lesions or masses. The cervix is bulbous.  Uterus is felt to be normal size, shape, and contour.  No adnexal masses or tenderness noted.Bladder is non tender, no masses felt. CV swab obtained Rectal: deferred at her request Extremities/musculoskeletal:  No swelling or varicosities noted, no clubbing or cyanosis Psych:  No mood changes, alert and cooperative,seems happy AA is 0 Fall risk is moderate    08/18/2023    2:29 PM 06/28/2023    2:13 PM 03/30/2023    1:58 PM  Depression screen PHQ 2/9  Decreased Interest 1 0 0  Down, Depressed, Hopeless 0 0 0  PHQ - 2 Score 1 0 0  Altered sleeping 1 0 0  Tired, decreased energy 1 0 0  Change in appetite 1 0 0  Feeling bad or failure about yourself  1 0 0  Trouble concentrating 1 0 0  Moving slowly or fidgety/restless 0 0 0  Suicidal thoughts 0 0 0  PHQ-9 Score 6 0 0  Difficult doing work/chores  Not difficult at all Not difficult at all   On cymbalta     08/18/2023    2:30 PM 06/28/2023    2:13 PM 03/30/2023    1:58 PM 12/21/2022    9:29 AM  GAD 7 : Generalized Anxiety Score  Nervous, Anxious, on Edge 1 0 0  0  Control/stop worrying 0 0 0 0  Worry too much - different things 0 0 0 1  Trouble relaxing 1 0 0 0  Restless 0 0 0 0  Easily annoyed or irritable 1 0 0 1  Afraid - awful might happen 0 0 0 0  Total GAD 7 Score 3 0 0 2  Anxiety Difficulty  Not difficult at all Not difficult at all Somewhat difficult      Upstream - 08/18/23 1439       Pregnancy Intention Screening   Does the patient want to become pregnant in the next year? No    Does the patient's partner want to become pregnant in the next year? No    Would the patient like to discuss contraceptive options today? No      Contraception Wrap Up   Current  Method Oral Contraceptive    End Method Oral Contraceptive    Contraception Counseling Provided Yes          Examination chaperoned by Clarita Salt LPN   Impression and plan: 1. Urinary urgency - POCT Urinalysis Dipstick  2. Urinary frequency - POCT Urinalysis Dipstick  3. Encounter for well woman exam with routine gynecological exam (Primary) Pap in 2027 Physical in 1 year  4. Screening examination for STD (sexually transmitted disease) CV swab sent for GC/CHL,trich,BV and yeast  - Cervicovaginal ancillary only( [place])  5. Encounter for screening mammogram for malignant neoplasm of breast Pt to call for mammogram appt - MM 3D SCREENING MAMMOGRAM BILATERAL BREAST; Future  6. Encounter for surveillance of contraceptive pills Refilled micronor  Meds ordered this encounter  Medications   norethindrone  (MICRONOR ) 0.35 MG tablet    Sig: Take 1 tablet (0.35 mg total) by mouth daily.    Dispense:  84 tablet    Refill:  3    Supervising Provider:   JAYNE MINDER H [[id number]]

## 2023-08-22 ENCOUNTER — Ambulatory Visit: Payer: Self-pay | Admitting: Adult Health

## 2023-08-22 LAB — CERVICOVAGINAL ANCILLARY ONLY
Bacterial Vaginitis (gardnerella): POSITIVE — AB
Candida Glabrata: NEGATIVE
Candida Vaginitis: NEGATIVE
Chlamydia: NEGATIVE
Comment: NEGATIVE
Comment: NEGATIVE
Comment: NEGATIVE
Comment: NEGATIVE
Comment: NEGATIVE
Comment: NORMAL
Neisseria Gonorrhea: NEGATIVE
Trichomonas: NEGATIVE

## 2023-08-22 MED ORDER — METRONIDAZOLE 500 MG PO TABS: 500.0000 mg | ORAL_TABLET | Freq: Two times a day (BID) | ORAL | 0 refills | Status: DC

## 2023-08-23 ENCOUNTER — Other Ambulatory Visit: Payer: Self-pay

## 2023-08-23 ENCOUNTER — Other Ambulatory Visit: Payer: Self-pay | Admitting: Internal Medicine

## 2023-08-26 ENCOUNTER — Encounter: Payer: Self-pay | Admitting: Advanced Practice Midwife

## 2023-09-24 ENCOUNTER — Other Ambulatory Visit: Payer: Self-pay | Admitting: Internal Medicine

## 2023-09-24 ENCOUNTER — Other Ambulatory Visit: Payer: Self-pay

## 2023-09-30 ENCOUNTER — Ambulatory Visit

## 2023-10-11 ENCOUNTER — Telehealth

## 2023-10-11 DIAGNOSIS — Z6838 Body mass index (BMI) 38.0-38.9, adult: Secondary | ICD-10-CM

## 2023-10-11 DIAGNOSIS — M47812 Spondylosis without myelopathy or radiculopathy, cervical region: Secondary | ICD-10-CM

## 2023-10-11 DIAGNOSIS — E66812 Obesity, class 2: Secondary | ICD-10-CM | POA: Diagnosis not present

## 2023-10-11 DIAGNOSIS — F17200 Nicotine dependence, unspecified, uncomplicated: Secondary | ICD-10-CM

## 2023-10-11 DIAGNOSIS — F418 Other specified anxiety disorders: Secondary | ICD-10-CM | POA: Diagnosis not present

## 2023-10-11 DIAGNOSIS — M51369 Other intervertebral disc degeneration, lumbar region without mention of lumbar back pain or lower extremity pain: Secondary | ICD-10-CM | POA: Diagnosis not present

## 2023-10-11 MED ORDER — ALPRAZOLAM 1 MG PO TABS: 1.0000 mg | ORAL_TABLET | Freq: Two times a day (BID) | ORAL | 0 refills | Status: DC | PRN

## 2023-10-11 MED ORDER — ALBUTEROL SULFATE HFA 108 (90 BASE) MCG/ACT IN AERS: 1.0000 | INHALATION_SPRAY | Freq: Four times a day (QID) | RESPIRATORY_TRACT | 2 refills | Status: AC | PRN

## 2023-10-11 MED ORDER — WEGOVY 2.4 MG/0.75ML ~~LOC~~ SOAJ: 2.4000 mg | SUBCUTANEOUS | 5 refills | Status: DC

## 2023-10-11 NOTE — Assessment & Plan Note (Signed)
 Persistent anxiety, especially socially. Current medication includes Cymbalta  and Xanax . - Refill Xanax  prescription. - Continue Cymbalta .

## 2023-10-11 NOTE — Assessment & Plan Note (Signed)
 Wegovy  has gradually titrated up to 2.4 mg weekly.  She reports that her most recent weight was 182 pounds, which is down a total of 25 pounds from 250 pounds when she started Wegovy  last fall.  She was congratulated on her progress and encouraged to maintain lifestyle modifications aimed at weight loss.

## 2023-10-11 NOTE — Progress Notes (Signed)
 Virtual Visit via Video Note  I connected with Michelle Payne on 10/11/23 at  8:00 AM EDT by a video enabled telemedicine application and verified that I am speaking with the correct person using two identifiers.  Patient Location: Home Provider Location: Office/Clinic  I discussed the limitations, risks, security, and privacy concerns of performing an evaluation and management service by video and the availability of in person appointments. I also discussed with the patient that there may be a patient responsible charge related to this service. The patient expressed understanding and agreed to proceed.  Subjective: PCP: Bevely Doffing, FNP  Chief Complaint  Patient presents with   Medical Management of Chronic Issues    Follow up   HPI Discussed the use of AI scribe software for clinical note transcription with the patient, who gave verbal consent to proceed.  History of Present Illness   Michelle Payne is a 48 year old female with arthritis and hypertension who presents with chronic pain and anxiety.  Chronic musculoskeletal pain - Daily pain primarily in knees and fingers attributed to arthritis - No improvement in arthritis pain despite 90-pound weight loss since July of last year - Sciatic nerve pain radiating from lower back into toes, worsening despite weight loss - Temporary relief of sciatic pain for approximately 30 minutes with specific stretching exercises - History of intra-articular and nerve injections for knee and sciatic pain - Cymbalta  provides more benefit for mood than for pain - Ibuprofen  800 mg used as needed  Hypertension - Blood pressure controlled below 120 with antihypertensive medication - Inconsistent adherence to daily blood pressure medication regimen  Anxiety and mood symptoms - Significant anxiety, especially in social situations - Cymbalta  and Celexa prescribed for mood and anxiety - Cymbalta  taken in the morning improves mood - Xanax   prescribed for anxiety, requires approval for refills  Muscle spasms - Muscle spasms affecting ribs, back, and eye - Suspects spasms may be related to medication use  Respiratory symptoms - History of bronchitis - Increased wheezing over the past two weeks - Recent resumption of smoking, currently five cigarettes per day after a period of cessation - Suspects allergies, particularly to ragweed, as a contributing factor to respiratory symptoms - Requests refill for inhaler  Medication management - Current medications include Cymbalta , Celexa, ibuprofen  800 mg as needed, and antihypertensive medication - Requests refill for Wegovy        ROS: Per HPI  Current Outpatient Medications:    DULoxetine  (CYMBALTA ) 60 MG capsule, Take 1 capsule (60 mg total) by mouth daily., Disp: 90 capsule, Rfl: 3   metroNIDAZOLE  (FLAGYL ) 500 MG tablet, Take 1 tablet (500 mg total) by mouth 2 (two) times daily., Disp: 14 tablet, Rfl: 0   norethindrone  (MICRONOR ) 0.35 MG tablet, Take 1 tablet (0.35 mg total) by mouth daily., Disp: 84 tablet, Rfl: 3   olmesartan  (BENICAR ) 20 MG tablet, TAKE 1 TABLET BY MOUTH EVERY DAY, Disp: 30 tablet, Rfl: 2   albuterol  (VENTOLIN  HFA) 108 (90 Base) MCG/ACT inhaler, Inhale 1-2 puffs into the lungs every 6 (six) hours as needed for wheezing or shortness of breath., Disp: 18 g, Rfl: 2   ALPRAZolam  (XANAX ) 1 MG tablet, Take 1 tablet (1 mg total) by mouth 2 (two) times daily as needed for anxiety., Disp: 60 tablet, Rfl: 0   semaglutide -weight management (WEGOVY ) 2.4 MG/0.75ML SOAJ SQ injection, Inject 2.4 mg into the skin once a week., Disp: 6 mL, Rfl: 5  Observations/Objective: There were no vitals filed for this  visit. Physical Exam  Assessment and Plan: Depression with anxiety Assessment & Plan: Persistent anxiety, especially socially. Current medication includes Cymbalta  and Xanax . - Refill Xanax  prescription. - Continue Cymbalta .  Orders: -     ALPRAZolam ; Take 1  tablet (1 mg total) by mouth 2 (two) times daily as needed for anxiety.  Dispense: 60 tablet; Refill: 0  Class 2 severe obesity due to excess calories with serious comorbidity and body mass index (BMI) of 38.0 to 38.9 in adult [facility]) Assessment & Plan: Wegovy  has gradually titrated up to 2.4 mg weekly.  She reports that her most recent weight was 182 pounds, which is down a total of 25 pounds from 250 pounds when she started Wegovy  last fall.  She was congratulated on her progress and encouraged to maintain lifestyle modifications aimed at weight loss.  Orders: -     Wegovy ; Inject 2.4 mg into the skin once a week.  Dispense: 6 mL; Refill: 5  Tobacco use disorder Assessment & Plan: Nicotine  dependence Recent smoking relapse, currently five cigarettes a day. Previous cessation achieved for seven months. - Encourage smoking cessation efforts.  Orders: -     Albuterol  Sulfate HFA; Inhale 1-2 puffs into the lungs every 6 (six) hours as needed for wheezing or shortness of breath.  Dispense: 18 g; Refill: 2  Spondylosis, cervical Assessment & Plan: Lumbar and cervical disc protrusion with radiculopathy Chronic disc protrusion with radiculopathy causing significant pain. Previous MRI shows bulging discs. Pain persists despite interventions. - Refer to neurosurgeon for further evaluation and management.   Orders: -     Ambulatory referral to Neurosurgery  Bulging of lumbar intervertebral disc Assessment & Plan: Lumbar and cervical disc protrusion with radiculopathy Chronic disc protrusion with radiculopathy causing significant pain. Previous MRI shows bulging discs. Pain persists despite interventions. - Refer to neurosurgeon for further evaluation and management.   Orders: -     Ambulatory referral to Neurosurgery     Follow Up Instructions: No follow-ups on file.   I discussed the assessment and treatment plan with the patient. The patient was provided an opportunity to ask  questions, and all were answered. The patient agreed with the plan and demonstrated an understanding of the instructions.   The patient was advised to call back or seek an in-person evaluation if the symptoms worsen or if the condition fails to improve as anticipated.  The above assessment and management plan was discussed with the patient. The patient verbalized understanding of and has agreed to the management plan.   [name], FNP

## 2023-10-11 NOTE — Assessment & Plan Note (Signed)
 Nicotine  dependence Recent smoking relapse, currently five cigarettes a day. Previous cessation achieved for seven months. - Encourage smoking cessation efforts.

## 2023-10-11 NOTE — Assessment & Plan Note (Signed)
 Lumbar and cervical disc protrusion with radiculopathy Chronic disc protrusion with radiculopathy causing significant pain. Previous MRI shows bulging discs. Pain persists despite interventions. - Refer to neurosurgeon for further evaluation and management.

## 2023-11-09 ENCOUNTER — Other Ambulatory Visit: Payer: Self-pay

## 2023-11-09 ENCOUNTER — Telehealth: Payer: Self-pay

## 2023-11-09 DIAGNOSIS — F418 Other specified anxiety disorders: Secondary | ICD-10-CM

## 2023-11-09 NOTE — Telephone Encounter (Signed)
 Copied from CRM #[id number]. Topic: Clinical - Medication Refill >> Nov 09, 2023  2:24 PM Mia F wrote: Medication: ALPRAZolam  (XANAX ) 1 MG tablet   Has the patient contacted their pharmacy? Yes (Agent: If no, request that the patient contact the pharmacy for the refill. If patient does not wish to contact the pharmacy document the reason why and proceed with request.) (Agent: If yes, when and what did the pharmacy advise?)  This is the patient's preferred pharmacy:  [id number] - [place], [place] - [address] [facility] [address] [place] [address] Phone: [phone] Fax: [phone]  Is this the correct pharmacy for this prescription? Yes If no, delete pharmacy and type the correct one.   Has the prescription been filled recently? Yes  Is the patient out of the medication? No- 2 left   Has the patient been seen for an appointment in the last year OR does the patient have an upcoming appointment? Yes  Can we respond through MyChart? Yes  Agent: Please be advised that Rx refills may take up to 3 business days. We ask that you follow-up with your pharmacy.

## 2023-11-09 NOTE — Telephone Encounter (Signed)
 Copied from CRM [id number]. Topic: Clinical - Prescription Issue >> Nov 09, 2023  2:24 PM Michelle Payne wrote: Reason for CRM: Pt recieved a letter from her insurance stating that they will longer cover semaglutide -weight management (WEGOVY ) 2.4 MG/0.75ML SOAJ SQ injection and two other weight loss medications. Pt was informed to contact her pcp before she runs out of medication and she took her last shot yesterday. Pt says she was told to see if she could get a different medication.

## 2023-11-14 MED ORDER — ALPRAZOLAM 1 MG PO TABS: 1.0000 mg | ORAL_TABLET | Freq: Two times a day (BID) | ORAL | 0 refills | Status: DC | PRN

## 2023-11-17 NOTE — Progress Notes (Deleted)
 Referring Physician:  Bevely Doffing, FNP [address] [place],  [place] [address]  Primary Physician:  Bevely Doffing, FNP  History of Present Illness: 11/17/2023 Michelle Payne is here today with a chief complaint of ***  Cervical pain. Pain persists despite interventions.  Any numbness, tingling, and weakness?   Duration: *** Location: *** Quality: *** Severity: ***  Precipitating: aggravated by *** Modifying factors: made better by *** Weakness: none Timing: *** Bowel/Bladder Dysfunction: none  Conservative measures:  Physical therapy: *** Has not participated in? Multimodal medical therapy including regular antiinflammatories: *** Cymbalta   Injections: *** 10/17/2019 Left at L4-5  07/27/2019 Left at L4-5  04/22/02021 Left at C7-T1 03/05/2019 Left at C7-T1  Past Surgery: ***  Michelle Payne has ***no symptoms of cervical myelopathy.  The symptoms are causing a significant impact on the patient's life.   Review of Systems:  A 10 point review of systems is negative, except for the pertinent positives and negatives detailed in the HPI.  Past Medical History: Past Medical History:  Diagnosis Date   Anxiety    Arthritis    knees   Bipolar 2 disorder (HCC)    Carpal tunnel syndrome on left 02/2016   COPD (chronic obstructive pulmonary disease) (HCC)    denies home O2   Depression    depression, bipolar   Headache    unspecified; 2 x/week    Past Surgical History: Past Surgical History:  Procedure Laterality Date   CARPAL TUNNEL RELEASE Left 02/24/2016   Procedure: LEFT CARPAL TUNNEL RELEASE;  Surgeon: Franky Curia, MD;  Location: [facility];  Service: Orthopedics;  Laterality: Left;   TOOTH EXTRACTION     WISDOM TOOTH EXTRACTION      Allergies: Allergies as of 11/29/2023 - Review Complete 10/11/2023  Allergen Reaction Noted   Bee venom Shortness Of Breath and Swelling 07/02/2012   Mixed ragweed Itching 11/16/2022     Medications: Outpatient Encounter Medications as of 11/29/2023  Medication Sig   albuterol  (VENTOLIN  HFA) 108 (90 Base) MCG/ACT inhaler Inhale 1-2 puffs into the lungs every 6 (six) hours as needed for wheezing or shortness of breath.   ALPRAZolam  (XANAX ) 1 MG tablet Take 1 tablet (1 mg total) by mouth 2 (two) times daily as needed for anxiety.   DULoxetine  (CYMBALTA ) 60 MG capsule Take 1 capsule (60 mg total) by mouth daily.   metroNIDAZOLE  (FLAGYL ) 500 MG tablet Take 1 tablet (500 mg total) by mouth 2 (two) times daily.   norethindrone  (MICRONOR ) 0.35 MG tablet Take 1 tablet (0.35 mg total) by mouth daily.   olmesartan  (BENICAR ) 20 MG tablet TAKE 1 TABLET BY MOUTH EVERY DAY   semaglutide -weight management (WEGOVY ) 2.4 MG/0.75ML SOAJ SQ injection Inject 2.4 mg into the skin once a week.   No facility-administered encounter medications on file as of 11/29/2023.    Social History: Social History   Tobacco Use   Smoking status: Every Day    Current packs/day: 0.00    Average packs/day: 0.3 packs/day for 15.0 years (3.8 ttl pk-yrs)    Types: Cigarettes    Start date: 07/29/2001    Last attempt to quit: 07/29/2016    Years since quitting: 7.3   Smokeless tobacco: Never  Vaping Use   Vaping status: Never Used  Substance Use Topics   Alcohol use: No   Drug use: No    Types: Cocaine, Marijuana    Comment: last used 06/09/16    Family Medical History: Family History  Problem Relation Age of Onset   Diabetes Paternal Grandmother    Heart disease Maternal Grandmother    Cancer Maternal Grandfather    Hypertension Mother    ADD / ADHD Son    ADD / ADHD Son     Physical Examination: @VITALWITHPAIN @  General: Patient is well developed, well nourished, calm, collected, and in no apparent distress. Attention to examination is appropriate.  Psychiatric: Patient is non-anxious.  Head:  Pupils equal, round, and reactive to light.  ENT:  Oral mucosa appears well  hydrated.  Neck:   Supple.  ***Full range of motion.  Respiratory: Patient is breathing without any difficulty.  Extremities: No edema.  Vascular: Palpable dorsal pedal pulses.  Skin:   On exposed skin, there are no abnormal skin lesions.  NEUROLOGICAL:     Awake, alert, oriented to person, place, and time.  Speech is clear and fluent. Fund of knowledge is appropriate.   Cranial Nerves: Pupils equal round and reactive to light.  Facial tone is symmetric.  Facial sensation is symmetric.  ROM of spine: ***full.  Palpation of spine: ***non tender.    Strength: Side Biceps Triceps Deltoid Interossei Grip Wrist Ext. Wrist Flex.  R 5 5 5 5 5 5 5   L 5 5 5 5 5 5 5    Side Iliopsoas Quads Hamstring PF DF EHL  R 5 5 5 5 5 5   L 5 5 5 5 5 5    Reflexes are ***2+ and symmetric at the biceps, triceps, brachioradialis, patella and achilles.   Hoffman's is absent.  Clonus is not present.  Toes are down-going.  Bilateral upper and lower extremity sensation is intact to light touch.    Gait is normal.   No difficulty with tandem gait.   No evidence of dysmetria noted.  Medical Decision Making  Imaging: ***  I have personally reviewed the images and agree with the above interpretation.  Assessment and Plan: Michelle Payne is a pleasant 48 y.o. female with ***    Thank you for involving me in the care of this patient.   I spent a total of *** minutes in both face-to-face and non-face-to-face activities for this visit on the date of this encounter.   Lyle Decamp, PA-C Dept. of Neurosurgery

## 2023-11-29 ENCOUNTER — Ambulatory Visit: Admitting: Physician Assistant

## 2023-12-20 ENCOUNTER — Ambulatory Visit: Admitting: Physician Assistant

## 2023-12-20 ENCOUNTER — Ambulatory Visit

## 2023-12-20 ENCOUNTER — Encounter: Payer: Self-pay | Admitting: Physician Assistant

## 2023-12-20 VITALS — BP 130/82 | Ht 63.0 in | Wt 182.0 lb

## 2023-12-20 DIAGNOSIS — M5125 Other intervertebral disc displacement, thoracolumbar region: Secondary | ICD-10-CM

## 2023-12-20 DIAGNOSIS — M51369 Other intervertebral disc degeneration, lumbar region without mention of lumbar back pain or lower extremity pain: Secondary | ICD-10-CM

## 2023-12-20 DIAGNOSIS — M47812 Spondylosis without myelopathy or radiculopathy, cervical region: Secondary | ICD-10-CM

## 2023-12-20 DIAGNOSIS — M5010 Cervical disc disorder with radiculopathy, unspecified cervical region: Secondary | ICD-10-CM

## 2023-12-20 MED ORDER — DICLOFENAC SODIUM 1 % EX GEL: 2.0000 g | Freq: Four times a day (QID) | CUTANEOUS | 1 refills | Status: DC

## 2023-12-20 MED ORDER — TIZANIDINE HCL 4 MG PO TABS: 2.0000 mg | ORAL_TABLET | Freq: Three times a day (TID) | ORAL | 1 refills | Status: DC | PRN

## 2023-12-20 NOTE — Progress Notes (Unsigned)
 Referring Physician:  Bevely Doffing, FNP [address] [place],  [place] [address]  Primary Physician:  Bevely Doffing, FNP  History of Present Illness: 12/20/2023 Michelle Payne is here today with a chief complaint of a both neck and back pain.  They are both chronic in nature that have become significantly worse.  Currently she has neck pain that radiates into her left arm I dissociate with burning pain in the upper part of her arm that stops at the level of her elbow.  She feels the muscle pain and tightness in her shoulders and between her shoulder blades.  She adds that she feels as though she has had a loss of range of motion when turning her neck to the right.  She gets a burning pain in her bicep.  Of note previous carpal tunnel surgery release on the right upper extremity.  She has been using heat.  No gait instability at this time.  The pain in her low back radiates down to her left leg into her foot.  She denies any frank weakness and has been trying to do home exercises and using heat to help relieve this.  She is also used a muscle relaxer in the past that has helped tremendously.  No new saddle anesthesia or incontinence.   Duration: 2+ years Severity: 10/10  Precipitating: aggravated by cold weather, movement, sitting Modifying factors: made better by nothing Weakness: none Timing: constant Bowel/Bladder Dysfunction: none  Conservative measures:  Physical therapy: has participated in the past, but not within the past year.  Multimodal medical therapy including regular antiinflammatories: Cymbalta , tylenol , medications, muscle relaxers  Injections: these were not helpful  10/17/2019 Left at L4-5  07/27/2019 Left at L4-5  04/22/02021 Left at C7-T1 03/05/2019 Left at C7-T1  Past Surgery: no spinal surgeries   Michelle Payne has no symptoms of cervical myelopathy.  The symptoms are causing a significant impact on the patient's life.   Review of Systems:   A 10 point review of systems is negative, except for the pertinent positives and negatives detailed in the HPI.  Past Medical History: Past Medical History:  Diagnosis Date   Anxiety    Arthritis    knees   Bipolar 2 disorder (HCC)    Carpal tunnel syndrome on left 02/2016   COPD (chronic obstructive pulmonary disease) (HCC)    denies home O2   Depression    depression, bipolar   Headache    unspecified; 2 x/week    Past Surgical History: Past Surgical History:  Procedure Laterality Date   CARPAL TUNNEL RELEASE Left 02/24/2016   Procedure: LEFT CARPAL TUNNEL RELEASE;  Surgeon: Franky Curia, MD;  Location: [facility];  Service: Orthopedics;  Laterality: Left;   TOOTH EXTRACTION     WISDOM TOOTH EXTRACTION      Allergies: Allergies as of 12/20/2023 - Review Complete 12/20/2023  Allergen Reaction Noted   Bee venom Shortness Of Breath and Swelling 07/02/2012   Mixed ragweed Itching 11/16/2022    Medications: Outpatient Encounter Medications as of 12/20/2023  Medication Sig   albuterol  (VENTOLIN  HFA) 108 (90 Base) MCG/ACT inhaler Inhale 1-2 puffs into the lungs every 6 (six) hours as needed for wheezing or shortness of breath.   ALPRAZolam  (XANAX ) 1 MG tablet Take 1 tablet (1 mg total) by mouth 2 (two) times daily as needed for anxiety.   DULoxetine  (CYMBALTA ) 60 MG capsule Take 1 capsule (60 mg total) by mouth daily.   norethindrone  (  MICRONOR ) 0.35 MG tablet Take 1 tablet (0.35 mg total) by mouth daily.   olmesartan  (BENICAR ) 20 MG tablet TAKE 1 TABLET BY MOUTH EVERY DAY   [DISCONTINUED] metroNIDAZOLE  (FLAGYL ) 500 MG tablet Take 1 tablet (500 mg total) by mouth 2 (two) times daily.   [DISCONTINUED] semaglutide -weight management (WEGOVY ) 2.4 MG/0.75ML SOAJ SQ injection Inject 2.4 mg into the skin once a week.   No facility-administered encounter medications on file as of 12/20/2023.    Social History: Social History   Tobacco Use   Smoking status: Every  Day    Current packs/day: 0.00    Average packs/day: 0.3 packs/day for 15.0 years (3.8 ttl pk-yrs)    Types: Cigarettes    Start date: 07/29/2001    Last attempt to quit: 07/29/2016    Years since quitting: 7.3   Smokeless tobacco: Never  Vaping Use   Vaping status: Never Used  Substance Use Topics   Alcohol use: No   Drug use: No    Types: Cocaine, Marijuana    Comment: last used 06/09/16    Family Medical History: Family History  Problem Relation Age of Onset   Diabetes Paternal Grandmother    Heart disease Maternal Grandmother    Cancer Maternal Grandfather    Hypertension Mother    ADD / ADHD Son    ADD / ADHD Son     Physical Examination: @VITALWITHPAIN @  General: Patient is well developed, well nourished, calm, collected, and in no apparent distress. Attention to examination is appropriate.  Psychiatric: Patient is non-anxious.  Head:  Pupils equal, round, and reactive to light.  ENT:  Oral mucosa appears well hydrated.  Neck:   Supple.  Some decreased range of motion when looking to the right.  Respiratory: Patient is breathing without any difficulty.  Extremities: No edema.  Vascular: Palpable dorsal pedal pulses.  Skin:   On exposed skin, there are no abnormal skin lesions.  NEUROLOGICAL:     Awake, alert, oriented to person, place, and time.  Speech is clear and fluent. Fund of knowledge is appropriate.   Cranial Nerves: Pupils equal round and reactive to light.  Facial tone is symmetric.   ROM of spine: Some decreased range of motion of her cervical spine.  She does have a small soft tissue mass at the bottom of her cervical spine.  She is tender to palpation about her cervical and lumbar spine.  +spurlings +SLR on the left    Strength: Side Biceps Triceps Deltoid Interossei Grip Wrist Ext. Wrist Flex.  R 5 5 5 5 5 5 5   L 5 4 5 5 5 5 5    Side Iliopsoas Quads Hamstring PF DF EHL  R 5 5 5 5 5 5   L 5 5 4 5 5 5    Reflexes are 2+ and symmetric  at the biceps, triceps, brachioradialis, patella and achilles.   Hoffman's is absent.  Clonus is not present.  Toes are down-going.  Bilateral upper and lower extremity sensation is intact to light touch.    Gait is normal.   No difficulty with tandem gait.   No evidence of dysmetria noted.  Medical Decision Making  Imaging: CLINICAL DATA:  Initial evaluation for severe lower back pain radiating into the left lower extremity for more than 1 year.   EXAM: MRI LUMBAR SPINE WITHOUT CONTRAST   TECHNIQUE: Multiplanar, multisequence MR imaging of the lumbar spine was performed. No intravenous contrast was administered.   COMPARISON:  Prior radiograph from 01/02/2019.  FINDINGS: Segmentation: Standard. Lowest well-formed disc space labeled the L5-S1 level.   Alignment: Physiologic with preservation of the normal lumbar lordosis. No listhesis.   Vertebrae: Vertebral body height maintained without evidence for acute or chronic fracture. Bone marrow signal intensity within normal limits. No discrete or worrisome osseous lesions. No abnormal marrow edema.   Conus medullaris and cauda equina: Conus extends to the L1 level. Conus and cauda equina appear normal.   Paraspinal and other soft tissues: Paraspinous soft tissues within normal limits. 8 mm simple appearing cystic lesion seen involving the pancreatic tail (series 6, image 4). Visualized visceral structures otherwise unremarkable.   Disc levels:   T11-12: Seen only on sagittal projection. Diffuse disc bulge with disc desiccation. No significant stenosis.   T12-L1: Right eccentric disc bulge with disc desiccation. Superimposed shallow right paracentral disc protrusion flattens the right ventral thecal sac. Associated annular fissure. No significant spinal stenosis. Foramina remain patent.   L1-2:  Mild annular disc bulge. No canal or foraminal stenosis.   L2-3:  \\\ unremarkable.   L3-4: Normal interspace. Minimal  facet hypertrophy. No canal or foraminal stenosis.   L4-5: Normal interspace. Mild facet hypertrophy. No canal or foraminal stenosis.   L5-S1: Normal interspace. Mild facet hypertrophy. No canal or foraminal stenosis.   IMPRESSION: 1. Shallow right paracentral disc protrusion at T12-L1 without significant stenosis or neural impingement. 2. Additional mild noncompressive disc bulging at T11-12 and L1-2 without stenosis or impingement. 3. Mild bilateral facet hypertrophy at L3-4 through L5-S1. 4. 8 mm cystic lesion arising from the pancreatic tail, indeterminate. Further assessment with dedicated pancreatic mass protocol MRI recommended for further characterization.  EXAM: MRI CERVICAL SPINE WITHOUT CONTRAST   TECHNIQUE: Multiplanar, multisequence MR imaging of the cervical spine was performed. No intravenous contrast was administered.   COMPARISON:  Radiography 09/19/2018   FINDINGS: Alignment: Straightening of the normal cervical lordosis.   Vertebrae: No fracture or primary bone lesion.   Cord: No cord compression or primary cord lesion.   Posterior Fossa, vertebral arteries, paraspinal tissues: Normal   Disc levels:   Foramen magnum and C1-2 are normal.   C2-3: Normal appearance of the disc. Mild facet osteoarthritis on the right without edema or encroachment upon the neural structures.   C3-4: Endplate osteophytes and mild bulging of the disc. Facet osteoarthritis on the right. No canal or foraminal stenosis.   C4-5: Endplate osteophytes and mild bulging of the disc. Facet osteoarthritis on the right. Foraminal stenosis on the right that could compress the right C5 nerve.   C5-6: Spondylosis with endplate osteophytes and bulging of the disc. Foraminal encroachment by osteophytes right worse than left. Right C6 nerve compression seems possible. There is only mild foraminal encroachment on the left.   C6-7: Mild bulging of the disc.  No canal or foraminal  stenosis.   C7-T1: Normal interspace.   IMPRESSION: Right-sided cervical facet degeneration at C3-4, C4-5 and a lesser extent C2-3. Degenerative cervical spondylosis from C3-4 through C6-7. Foraminal narrowing that could cause neural compression on the right at C4-5 and C5-6. I do not see left-sided foraminal narrowing that would definitely cause neural compression. There is mild foraminal narrowing on the left at C5-6, as the only real left-sided finding in this case.    I have personally reviewed the images and agree with the above interpretation.  Assessment and Plan: Michelle Payne is a pleasant 48 y.o. female with chronic neck and back pain that have become progressively worse.  Her neck pain radiates  into her left arm level of her elbow and her back pain radiates into her left leg to the level of her foot.  Muscle relaxers have helped the most previously for her pain.  Her pain is chronic, but has become worse over the past several months.  She does have a known disc protrusions extending from T11-L2 without significant stenosis back 5 years ago.  Also previously seen was degenerative cervical spondylosis. Plan includes the following:  -X-rays today of cervical and lumbar spine to include flexion and extension  -MRI of cervical and lumbar spine to evaluate for any significant stenosis  -Plan for tizanidine to manage muscle spasms  -Voltaren gel also ordered for patient to use as directed when experiencing pain.  -Physical therapy referral sent  Thank you for involving me in the care of this patient.     Lyle Decamp, PA-C Dept. of Neurosurgery

## 2023-12-23 ENCOUNTER — Other Ambulatory Visit: Payer: Self-pay | Admitting: Physician Assistant

## 2023-12-23 ENCOUNTER — Other Ambulatory Visit: Payer: Self-pay

## 2023-12-23 ENCOUNTER — Telehealth: Payer: Self-pay | Admitting: Physician Assistant

## 2023-12-23 MED ORDER — LIDOCAINE 5 % EX PTCH: 1.0000 | MEDICATED_PATCH | CUTANEOUS | 0 refills | Status: AC

## 2023-12-23 NOTE — Telephone Encounter (Signed)
 Left message for patient to call back. Patches may not be covered by her insurance fyi

## 2023-12-23 NOTE — Telephone Encounter (Signed)
 Received PA request and it is Approved 12/23/2023-12/22/2024, pharmacy notified and sent patient mychart message

## 2023-12-23 NOTE — Telephone Encounter (Signed)
 Spoke with patient and she would like to know if she can have a prescription for the lidocaine  patches. She states she cannot afford over the counter medication. Please advise

## 2024-01-12 ENCOUNTER — Other Ambulatory Visit: Payer: Self-pay

## 2024-01-12 DIAGNOSIS — I10 Essential (primary) hypertension: Secondary | ICD-10-CM

## 2024-01-18 ENCOUNTER — Other Ambulatory Visit: Payer: Self-pay

## 2024-01-18 DIAGNOSIS — F418 Other specified anxiety disorders: Secondary | ICD-10-CM

## 2024-01-24 DIAGNOSIS — M51369 Other intervertebral disc degeneration, lumbar region without mention of lumbar back pain or lower extremity pain: Secondary | ICD-10-CM | POA: Diagnosis not present

## 2024-01-24 DIAGNOSIS — M5010 Cervical disc disorder with radiculopathy, unspecified cervical region: Secondary | ICD-10-CM | POA: Diagnosis not present

## 2024-01-24 DIAGNOSIS — M6281 Muscle weakness (generalized): Secondary | ICD-10-CM | POA: Diagnosis not present

## 2024-02-10 ENCOUNTER — Inpatient Hospital Stay: Admission: RE | Admit: 2024-02-10 | Source: Ambulatory Visit

## 2024-02-10 ENCOUNTER — Other Ambulatory Visit

## 2024-02-15 ENCOUNTER — Other Ambulatory Visit: Payer: Self-pay | Admitting: Physician Assistant

## 2024-02-20 ENCOUNTER — Ambulatory Visit: Payer: Self-pay

## 2024-02-20 VITALS — BP 159/101 | HR 74 | Ht 64.0 in | Wt 180.0 lb

## 2024-02-20 DIAGNOSIS — F418 Other specified anxiety disorders: Secondary | ICD-10-CM | POA: Diagnosis not present

## 2024-02-20 DIAGNOSIS — G8929 Other chronic pain: Secondary | ICD-10-CM | POA: Diagnosis not present

## 2024-02-20 DIAGNOSIS — I10 Essential (primary) hypertension: Secondary | ICD-10-CM

## 2024-02-20 DIAGNOSIS — M7918 Myalgia, other site: Secondary | ICD-10-CM | POA: Diagnosis not present

## 2024-02-20 MED ORDER — OLMESARTAN MEDOXOMIL 20 MG PO TABS: 20.0000 mg | ORAL_TABLET | Freq: Two times a day (BID) | ORAL | 5 refills | Status: AC

## 2024-02-20 MED ORDER — ALPRAZOLAM 1 MG PO TABS: 1.0000 mg | ORAL_TABLET | Freq: Two times a day (BID) | ORAL | 0 refills | Status: AC | PRN

## 2024-02-20 MED ORDER — HYDROCHLOROTHIAZIDE 12.5 MG PO TABS: 12.5000 mg | ORAL_TABLET | Freq: Every day | ORAL | 3 refills | Status: AC

## 2024-02-20 MED ORDER — DULOXETINE HCL 30 MG PO CPEP: 30.0000 mg | ORAL_CAPSULE | Freq: Every day | ORAL | 5 refills | Status: AC

## 2024-02-20 NOTE — Assessment & Plan Note (Signed)
 Cymbalta  may elevate blood pressure. Preference to minimize medication and explore natural options. - Lowered Cymbalta  dose.

## 2024-02-20 NOTE — Assessment & Plan Note (Signed)
 Chronic pain in knees, neck, shoulders, and back. Pain management complicated by hypertension and medication side effects.

## 2024-02-20 NOTE — Assessment & Plan Note (Signed)
 Hypertension poorly controlled, especially in the morning. Olmesartan  may be insufficient. Cymbalta  may elevate blood pressure. - Increased olmesartan  to 40 mg daily, taken twice a day. - Added hydrochlorothiazide . - Instructed to monitor blood pressure at home and report if consistently above 140/90 mmHg. - Instructed to contact via MyChart if no improvement in a week.

## 2024-02-20 NOTE — Progress Notes (Signed)
 "  Established Patient Office Visit  Subjective   Patient ID: Michelle Payne, female    DOB: 1976/02/07  Age: 49 y.o. MRN: [id number]  Chief Complaint  Patient presents with   Hypertension    Follow up, increase in blood pressure. PT will not see patient due to high BP    HPI Discussed the use of AI scribe software for clinical note transcription with the patient, who gave verbal consent to proceed.  History of Present Illness    Michelle Payne is a 49 year old female with hypertension who presents with elevated blood pressure readings. She is accompanied by her daughter.  Hypertension and blood pressure variability - Elevated blood pressure readings, particularly in the morning upon waking - Blood pressure gradually decreases throughout the day, reaching normal levels by evening - Takes olmesartan  20 mg in the morning - Blood pressure appears to remain high on days when Cymbalta  is taken  Musculoskeletal pain - Pain in knees, neck, shoulders, back, and leg - Pain occurs spontaneously and is not related to specific activities - Attending physical therapy for musculoskeletal pain - No leg swelling  Headache - Mild headache located behind the eyes on the day of the visit  Psychiatric symptoms - History of anxiety and depression - Currently managed with Cymbalta  and Xanax  - Recent increase in Cymbalta  dosage - Anxiety impacts ability to perform certain activities, such as grocery shopping  Ear symptoms - Sensation of fullness and crackling in ears, especially when coughing or sneezing - Manages earwax buildup with hydrogen peroxide     Patient Active Problem List   Diagnosis Date Noted   Spondylosis, cervical 10/11/2023   Bulging of lumbar intervertebral disc 10/11/2023   Screening examination for STD (sexually transmitted disease) 08/18/2023   Encounter for well woman exam with routine gynecological exam 08/18/2023   Urinary frequency 08/18/2023   Urinary urgency  08/18/2023   Encounter for screening mammogram for malignant neoplasm of breast 08/18/2023   Encounter for surveillance of contraceptive pills 08/18/2023   Essential hypertension 12/21/2022   Chronic musculoskeletal pain 12/03/2022   Prediabetes 12/03/2022   Chronic obstructive airway disease (HCC) 12/03/2022   Elevated blood pressure reading 12/03/2022   Obesity 12/03/2022   Colon cancer screening 12/03/2022   Polysubstance abuse (HCC) 06/22/2016   Depression with anxiety 06/22/2016   Bipolar 2 disorder, major depressive episode (HCC) 06/09/2016   Cocaine use disorder, moderate, dependence (HCC) 06/09/2016   Cannabis use disorder, moderate, dependence (HCC) 06/09/2016   Substance induced mood disorder (HCC) 06/09/2016   Tobacco use disorder 05/25/2016   Radicular pain in right arm 08/08/2012   Acute back pain 07/11/2012    ROS    Objective:     BP (!) 159/101   Pulse 74   Ht 5' 4 (1.626 m)   Wt 180 lb (81.6 kg)   SpO2 95%   BMI 30.90 kg/m  BP Readings from Last 3 Encounters:  02/20/24 (!) 159/101  12/20/23 130/82  08/18/23 128/87   Wt Readings from Last 3 Encounters:  02/20/24 180 lb (81.6 kg)  12/20/23 182 lb (82.6 kg)  08/18/23 195 lb (88.5 kg)     Physical Exam Vitals and nursing note reviewed.  Constitutional:      Appearance: Normal appearance. She is obese.  HENT:     Head: Normocephalic.     Right Ear: Tympanic membrane, ear canal and external ear normal.     Left Ear: Tympanic membrane, ear canal and external ear normal.  Nose: Nose normal.     Mouth/Throat:     Mouth: Mucous membranes are moist.     Pharynx: Oropharynx is clear.  Eyes:     Extraocular Movements: Extraocular movements intact.     Conjunctiva/sclera: Conjunctivae normal.     Pupils: Pupils are equal, round, and reactive to light.  Cardiovascular:     Rate and Rhythm: Normal rate and regular rhythm.  Pulmonary:     Effort: Pulmonary effort is normal.     Breath sounds:  Normal breath sounds.  Abdominal:     General: Bowel sounds are normal.     Palpations: Abdomen is soft.  Musculoskeletal:        General: Normal range of motion.     Cervical back: Normal range of motion and neck supple.  Skin:    General: Skin is warm and dry.  Neurological:     Mental Status: She is alert and oriented to person, place, and time.  Psychiatric:        Mood and Affect: Mood normal.        Thought Content: Thought content normal.     No results found for any visits on 02/20/24.    The ASCVD Risk score (Arnett DK, et al., 2019) failed to calculate for the following reasons:   Cannot find a previous HDL lab   Cannot find a previous total cholesterol lab   * - Cholesterol units were assumed    Assessment & Plan:   Problem List Items Addressed This Visit       Cardiovascular and Mediastinum   Essential hypertension - Primary   Hypertension poorly controlled, especially in the morning. Olmesartan  may be insufficient. Cymbalta  may elevate blood pressure. - Increased olmesartan  to 40 mg daily, taken twice a day. - Added hydrochlorothiazide . - Instructed to monitor blood pressure at home and report if consistently above 140/90 mmHg. - Instructed to contact via MyChart if no improvement in a week.      Relevant Medications   olmesartan  (BENICAR ) 20 MG tablet   hydrochlorothiazide  (HYDRODIURIL ) 12.5 MG tablet     Other   Depression with anxiety   Cymbalta  may elevate blood pressure. Preference to minimize medication and explore natural options. - Lowered Cymbalta  dose.      Relevant Medications   ALPRAZolam  (XANAX ) 1 MG tablet   DULoxetine  (CYMBALTA ) 30 MG capsule   Chronic musculoskeletal pain   Chronic pain in knees, neck, shoulders, and back. Pain management complicated by hypertension and medication side effects.      Relevant Medications   DULoxetine  (CYMBALTA ) 30 MG capsule   Return in about 3 months (around 05/20/2024).    Michelle Longs,  FNP  "

## 2024-05-30 ENCOUNTER — Ambulatory Visit: Payer: Self-pay
